# Patient Record
Sex: Male | Born: 1935 | Race: White | Hispanic: No | Smoking: Former smoker
Health system: Southern US, Community
[De-identification: ages and names within clinical notes are randomized; demographics above are authoritative.]

## PROBLEM LIST (undated history)

## (undated) DIAGNOSIS — R296 Repeated falls: Secondary | ICD-10-CM

## (undated) DIAGNOSIS — E785 Hyperlipidemia, unspecified: Secondary | ICD-10-CM

## (undated) DIAGNOSIS — I639 Cerebral infarction, unspecified: Secondary | ICD-10-CM

## (undated) DIAGNOSIS — I10 Essential (primary) hypertension: Secondary | ICD-10-CM

## (undated) DIAGNOSIS — W19XXXA Unspecified fall, initial encounter: Secondary | ICD-10-CM

## (undated) DIAGNOSIS — A419 Sepsis, unspecified organism: Secondary | ICD-10-CM

## (undated) DIAGNOSIS — I480 Paroxysmal atrial fibrillation: Secondary | ICD-10-CM

## (undated) DIAGNOSIS — G20 Parkinson's disease: Secondary | ICD-10-CM

## (undated) DIAGNOSIS — I5189 Other ill-defined heart diseases: Secondary | ICD-10-CM

## (undated) DIAGNOSIS — G20A1 Parkinson's disease without dyskinesia, without mention of fluctuations: Secondary | ICD-10-CM

## (undated) HISTORY — DX: Hyperlipidemia, unspecified: E78.5

## (undated) HISTORY — DX: Other ill-defined heart diseases: I51.89

## (undated) HISTORY — DX: Repeated falls: R29.6

## (undated) HISTORY — PX: X-STOP IMPLANTATION: SHX2677

## (undated) HISTORY — DX: Unspecified fall, initial encounter: W19.XXXA

## (undated) HISTORY — DX: Paroxysmal atrial fibrillation: I48.0

## (undated) HISTORY — PX: CARDIOVASCULAR STRESS TEST: SHX262

---
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
## 2005-03-08 ENCOUNTER — Emergency Department: Payer: Self-pay | Admitting: Emergency Medicine

## 2005-03-18 ENCOUNTER — Emergency Department: Payer: Self-pay | Admitting: Internal Medicine

## 2005-10-19 ENCOUNTER — Ambulatory Visit: Payer: Self-pay | Admitting: Endocrinology

## 2007-01-12 HISTORY — PX: CARPAL TUNNEL RELEASE: SHX101

## 2007-09-26 ENCOUNTER — Ambulatory Visit: Payer: Self-pay | Admitting: Physician Assistant

## 2008-01-16 ENCOUNTER — Ambulatory Visit: Payer: Self-pay | Admitting: Unknown Physician Specialty

## 2008-01-16 ENCOUNTER — Ambulatory Visit: Payer: Self-pay | Admitting: Internal Medicine

## 2008-01-23 ENCOUNTER — Inpatient Hospital Stay: Payer: Self-pay | Admitting: Unknown Physician Specialty

## 2008-01-29 ENCOUNTER — Encounter: Payer: Self-pay | Admitting: Internal Medicine

## 2008-02-12 ENCOUNTER — Encounter: Payer: Self-pay | Admitting: Internal Medicine

## 2008-03-11 ENCOUNTER — Encounter: Payer: Self-pay | Admitting: Internal Medicine

## 2008-04-11 ENCOUNTER — Encounter: Payer: Self-pay | Admitting: Internal Medicine

## 2008-05-11 ENCOUNTER — Encounter: Payer: Self-pay | Admitting: Internal Medicine

## 2008-06-11 ENCOUNTER — Encounter: Payer: Self-pay | Admitting: Internal Medicine

## 2010-06-01 ENCOUNTER — Ambulatory Visit: Payer: Self-pay | Admitting: Emergency Medicine

## 2010-06-01 ENCOUNTER — Ambulatory Visit: Payer: Self-pay | Admitting: Internal Medicine

## 2010-06-02 ENCOUNTER — Ambulatory Visit: Payer: Self-pay | Admitting: Emergency Medicine

## 2010-06-29 ENCOUNTER — Ambulatory Visit: Payer: Self-pay | Admitting: Internal Medicine

## 2010-08-20 ENCOUNTER — Encounter: Payer: Self-pay | Admitting: Internal Medicine

## 2010-09-30 ENCOUNTER — Other Ambulatory Visit: Payer: Self-pay | Admitting: Internal Medicine

## 2010-09-30 MED ORDER — AMLODIPINE BESYLATE 5 MG PO TABS: 5.0000 mg | ORAL_TABLET | Freq: Every day | ORAL | Status: AC

## 2010-11-04 ENCOUNTER — Ambulatory Visit (INDEPENDENT_AMBULATORY_CARE_PROVIDER_SITE_OTHER): Payer: Medicare Other | Admitting: Internal Medicine

## 2010-11-04 ENCOUNTER — Encounter: Payer: Self-pay | Admitting: Internal Medicine

## 2010-11-04 VITALS — BP 156/80 | HR 85 | Temp 98.6°F | Resp 16 | Ht 63.0 in | Wt 156.0 lb

## 2010-11-04 DIAGNOSIS — I10 Essential (primary) hypertension: Secondary | ICD-10-CM | POA: Insufficient documentation

## 2010-11-04 DIAGNOSIS — Z23 Encounter for immunization: Secondary | ICD-10-CM

## 2010-11-04 DIAGNOSIS — L989 Disorder of the skin and subcutaneous tissue, unspecified: Secondary | ICD-10-CM

## 2010-11-04 DIAGNOSIS — J40 Bronchitis, not specified as acute or chronic: Secondary | ICD-10-CM

## 2010-11-04 DIAGNOSIS — E785 Hyperlipidemia, unspecified: Secondary | ICD-10-CM

## 2010-11-04 LAB — COMPREHENSIVE METABOLIC PANEL
ALT: 22 U/L (ref 0–53)
AST: 26 U/L (ref 0–37)
Calcium: 9.3 mg/dL (ref 8.4–10.5)
Chloride: 106 mEq/L (ref 96–112)
Creatinine, Ser: 1 mg/dL (ref 0.4–1.5)
Sodium: 142 mEq/L (ref 135–145)
Total Protein: 7.5 g/dL (ref 6.0–8.3)

## 2010-11-04 LAB — LIPID PANEL
LDL Cholesterol: 105 mg/dL — ABNORMAL HIGH (ref 0–99)
Total CHOL/HDL Ratio: 4
Triglycerides: 138 mg/dL (ref 0.0–149.0)

## 2010-11-04 MED ORDER — AZITHROMYCIN 250 MG PO TABS: ORAL_TABLET | ORAL | Status: DC

## 2010-11-04 NOTE — Patient Instructions (Signed)
Start antibiotics. We will set up lung function testing. Follow up in 1 month.

## 2010-11-04 NOTE — Progress Notes (Signed)
Subjective:    Patient ID: Kevin Bradshaw, male    DOB: 1935/09/28, 75 y.o.   MRN: [id number]  HPI Kevin Bradshaw is a 75 year old male with history of hyperlipidemia and hypertension who presents for followup. His primary concern today is a 2 month history of cough productive of yellow sputum. He denies any nasal congestion, sore throat, myalgia, fever, or chills. He notes that the cough has been fairly consistent over the last 2 months. He denies any known sick contacts. He denies any shortness of breath or chest pain. He denies any palpitations. He denies any lower extremity edema. He is a former smoker and smoked for approximately 40 years. He has never been diagnosed with COPD. He has not taken any medications for his cough.  He also notes a new skin lesion on his left temple. He reports this has been present for several months. It appears to be getting larger and is occasionally itchy. He denies any trauma at this site.   Outpatient Encounter Prescriptions as of 11/04/2010  Medication Sig Dispense Refill  . aspirin 81 MG tablet Take 81 mg by mouth daily.        . Calcium Carbonate (CALCIUM 500 PO) Take 2 tablets by mouth daily.        . cholecalciferol (VITAMIN D) 400 UNITS TABS Take 400 Units by mouth daily.        . Coenzyme Q-10 100 MG capsule Take 100 mg by mouth daily.        . Garlic 2000 MG TBEC Take 1 tablet by mouth daily.        Marland Kitchen lovastatin (MEVACOR) 20 MG tablet Take 20 mg by mouth at bedtime.        . Omega-3 Fatty Acids (FISH OIL) 1000 MG CAPS Take 1 capsule by mouth daily.        Marland Kitchen amLODipine (NORVASC) 5 MG tablet Take 1 tablet (5 mg total) by mouth daily.  30 tablet  6     Review of Systems  Constitutional: Negative for fever, chills, activity change, appetite change, fatigue and unexpected weight change.  HENT: Negative for ear pain, congestion, sneezing, neck pain, postnasal drip and sinus pressure.   Eyes: Negative for visual disturbance.  Respiratory: Positive for  cough. Negative for shortness of breath and wheezing.   Cardiovascular: Negative for chest pain, palpitations and leg swelling.  Gastrointestinal: Negative for abdominal pain and abdominal distention.  Genitourinary: Negative for dysuria, urgency and difficulty urinating.  Musculoskeletal: Negative for arthralgias and gait problem.  Skin: Positive for wound. Negative for color change and rash.  Neurological: Negative for headaches.  Hematological: Negative for adenopathy.  Psychiatric/Behavioral: Negative for sleep disturbance and dysphoric mood. The patient is not nervous/anxious.    BP 156/80  Pulse 85  Temp(Src) 98.6 F (37 C) (Oral)  Resp 16  Ht 5\' 3"  (1.6 m)  Wt 156 lb (70.761 kg)  BMI 27.63 kg/m2  SpO2 97%     Objective:   Physical Exam  Constitutional: He is oriented to person, place, and time. He appears well-developed and well-nourished. No distress.  HENT:  Head: Normocephalic and atraumatic.  Right Ear: External ear normal.  Left Ear: External ear normal.  Nose: Nose normal.  Mouth/Throat: Oropharynx is clear and moist. No oropharyngeal exudate.  Eyes: Conjunctivae and EOM are normal. Pupils are equal, round, and reactive to light. Right eye exhibits no discharge. Left eye exhibits no discharge. No scleral icterus.  Neck: Normal range of motion. Neck supple.  No tracheal deviation present. No thyromegaly present.  Cardiovascular: Normal rate, regular rhythm and normal heart sounds.  Exam reveals no gallop and no friction rub.   No murmur heard. Pulmonary/Chest: Effort normal. No respiratory distress. He has no decreased breath sounds. He has no wheezes. He has rhonchi in the right middle field. He has no rales.   He exhibits no tenderness.  Musculoskeletal: Normal range of motion. He exhibits no edema.  Lymphadenopathy:    He has no cervical adenopathy.  Neurological: He is alert and oriented to person, place, and time. No cranial nerve deficit. Coordination normal.    Skin: Skin is warm and dry. No rash noted. He is not diaphoretic. No erythema. No pallor.     Psychiatric: He has a normal mood and affect. His behavior is normal. Judgment and thought content normal.          Assessment & Plan:  1. Cough - patient with a two-month history of cough productive of yellow sputum. Exam is remarkable for slightly prolonged expiratory phase and a few rhonchi which clear with cough. Will treat as bronchitis with a course of azithromycin. There is no wheezing and I don't think he would benefit from prednisone. I would like to set him up for pulmonary function testing to evaluate for COPD. He had a recent chest x-ray performed [facility] which was normal. He will followup here in one month after pulmonary function testing is complete.  2. Skin Lesion -skin lesion in the left temple is most consistent with actinic keratoses or early squamous cell carcinoma. We'll set him up with dermatology for biopsy and resection.  3. Hyperlipidemia - will check liver function test and lipids with labs today.  4. Hypertension -blood pressure is slightly elevated today. He reports it has been better controlled at home. Will check renal function with labs today. Will repeat blood pressure check in one month.  5. Health Maintenance - patient declines flu shot today. Will give pneumonia shot.

## 2010-11-09 ENCOUNTER — Encounter: Payer: Self-pay | Admitting: Pulmonary Disease

## 2010-11-09 ENCOUNTER — Ambulatory Visit (INDEPENDENT_AMBULATORY_CARE_PROVIDER_SITE_OTHER)
Admission: RE | Admit: 2010-11-09 | Discharge: 2010-11-09 | Disposition: A | Discharge status: Home / Self Care | Payer: Medicare Other | Source: Ambulatory Visit | Attending: Pulmonary Disease | Admitting: Pulmonary Disease

## 2010-11-09 ENCOUNTER — Institutional Professional Consult (permissible substitution): Payer: Medicare Other | Admitting: Pulmonary Disease

## 2010-11-09 ENCOUNTER — Ambulatory Visit (INDEPENDENT_AMBULATORY_CARE_PROVIDER_SITE_OTHER): Payer: Medicare Other | Admitting: Pulmonary Disease

## 2010-11-09 VITALS — BP 138/70 | HR 77 | Temp 97.7°F | Ht 63.0 in | Wt 158.8 lb

## 2010-11-09 DIAGNOSIS — R05 Cough: Secondary | ICD-10-CM

## 2010-11-09 MED ORDER — GUAIFENESIN-DM 100-10 MG/5ML PO SYRP: 5.0000 mL | ORAL_SOLUTION | Freq: Three times a day (TID) | ORAL | Status: AC | PRN

## 2010-11-09 MED ORDER — CHLORPHENIRAMINE MALEATE 4 MG PO TABS: 4.0000 mg | ORAL_TABLET | Freq: Four times a day (QID) | ORAL | Status: DC | PRN

## 2010-11-09 NOTE — Patient Instructions (Signed)
Chronic cough, or cough lasting longer than 8 weeks is due to any of the following conditions in nearly 95% of cases: upper airway cough syndrome, GERD, asthma, chronic bronchitis, bronchiectasis, and non asthmatic eosinophilic bronchitis.  Smoking and ACE-Inhibitors are other common causes to consider.    Upper Airway Cough Syndrome, or UACS, is caused by either rhinitis (allergic, drug induced, vasomotor rhinitis, environmental irritant), or sinusitis (inflammatory, bacterial, or other infectious).  Effective forms of therapies include nasal steroids, antihistamines, decongestants, ipratropium nasal spray, and saline rinses.  Often the diagnosis is confirmed by response to treatment, and failure to respond to therapy necessitates further diagnostic work up such as CT imaging of the sinuses or 24 hour esophageal impedence studies.    Mr. Krysiak, for you we recommend: -Robitussin DM 2 tablespoons every 8 hours as needed for cough -Chlortrimeton every 4-6 hours as needed for cough -Chest X-ray today -Follow up with [place] in one month, call sooner if you are worse

## 2010-11-09 NOTE — Progress Notes (Signed)
  Subjective:    Patient ID: Kevin Bradshaw, male    DOB: 1935/03/01, 75 y.o.   MRN: [id number]  HPI75 y/o ex-smoker presented initially to [facility] with two months of cough productive of yellow sputum worse later in the day.  No associated shortness of breath, fevers, chills, wheezing.  Was prescribed azithromycin 10/24 and said that this helped the cough.   He states that he never had breathing trouble or any significant health problems as a child.  He has only been hospitalized for back surgery several years ago.  He smoked 1.5-2 packs per day up until 2005, and thinks that he smoked for 30-40 years.  He says that he can walk up to a mile on level ground at a steady pace without walking and never has to stop working around the house because of shortness of breath.  He denies chest pain, swelling in legs, sinus congestion, sour taste or acid in mouth, heartburn.  His weight has been stable.    Review of Systems  Constitutional: Negative for fever, chills, activity change, appetite change and unexpected weight change.  HENT: Positive for congestion. Negative for sore throat, rhinorrhea, sneezing, trouble swallowing, dental problem, voice change and postnasal drip.   Eyes: Negative for visual disturbance.  Respiratory: Positive for cough. Negative for choking and shortness of breath.   Cardiovascular: Negative for chest pain and leg swelling.  Gastrointestinal: Negative for nausea, vomiting and abdominal pain.  Genitourinary: Negative for difficulty urinating.  Musculoskeletal: Negative for arthralgias.  Skin: Negative for rash.  Psychiatric/Behavioral: Negative for behavioral problems and confusion.       Objective:   Physical Exam Gen: well appearing, no acute distress HEENT: NCAT, PERRL, EOMi, OP clear, neck supple without masses PULM: CTA B, slight barrel chest CV: RRR, no mgr, no JVD AB: BS+, soft, nontender, no hsm Ext: warm, no edema, no clubbing, no cyanosis Derm:  no rash or skin breakdown Neuro: A&Ox4, CN II-XII intact, strength 5/5 in all 4 extremeties      Assessment & Plan:  75 y/o male with a significant smoking history presents for evaluation of two months of cough productive of yellow sputum.  He denies shortness of breath or wheezing, however his syndrome seems consistent with chronic bronchitis related to his prior tobacco use.  Also in the differential would include occult aspiration, sinus disease (he denies), reflux, or much less likely pertussis, eosinophilic bronchitis or atypical mycobacteria.  He improved on azithromycin which suggests an infectious etiology for his persistent bronchitis.  There is little role for sputum culture today as he finished his course of antibiotics this morning.  If he contin  Today in clinic he had simple spirometry performed which showed an essentially normal flow volume loop with occasional cough, but no clear evidence of obstruction.  This makes COPD much less likely.  If he continues to do well after the course of antibiotics and with symptomatic therapy (mucolytics and anti-histamines that I am adding today) then we will assume that this was a prolonged course of acute bronchitis.  However if he still has symptoms in a month then we will need to perform a sputum culture and cytology to look for MAC and eosinophils respectively.  Plan: -OTC meds: Robitussin DM and Chlorpheniramine -RTC in 4 weeks

## 2010-12-07 ENCOUNTER — Ambulatory Visit: Payer: Medicare Other | Admitting: Internal Medicine

## 2011-05-19 ENCOUNTER — Encounter: Payer: Medicare Other | Admitting: Internal Medicine

## 2011-06-14 IMAGING — US US CAROTID DUPLEX BILAT
1 series · 17 of 24 positions shown · non-contrast
Comparison: none

REASON FOR EXAM: Rt Neck Pain
COMMENTS:

[Series 1: us carotid duplex bilat · 17 of 69 slices shown]
[im 1/69]
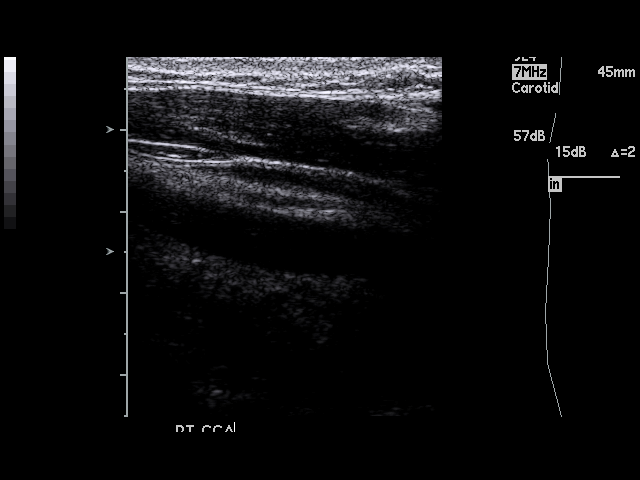
[im 6/69]
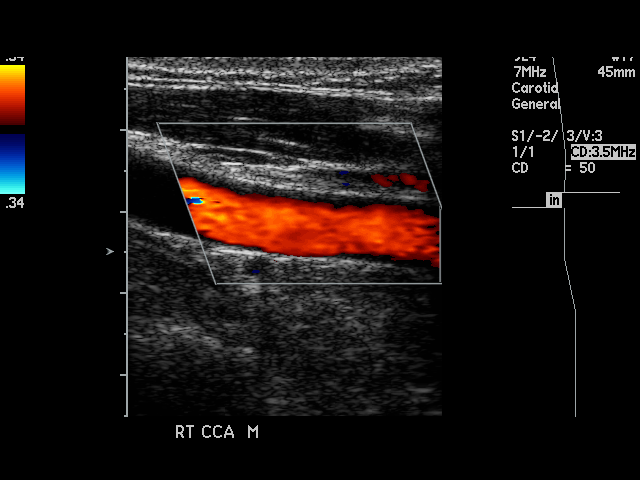
[im 9/69]
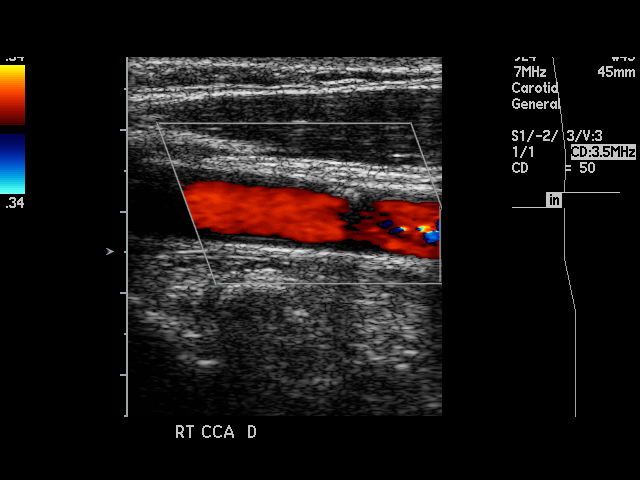
[im 12/69]
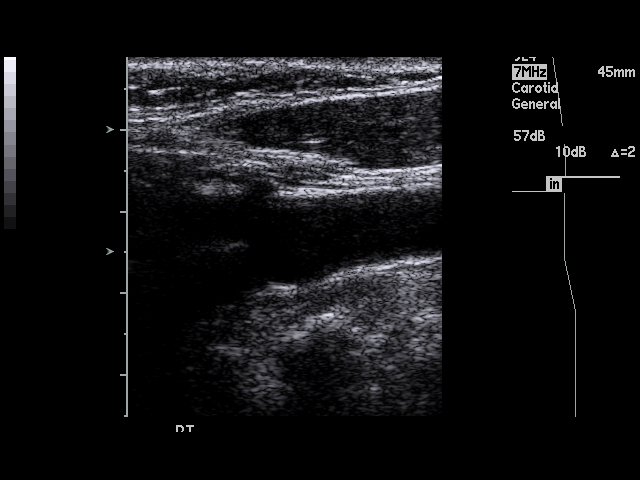
[im 18/69]
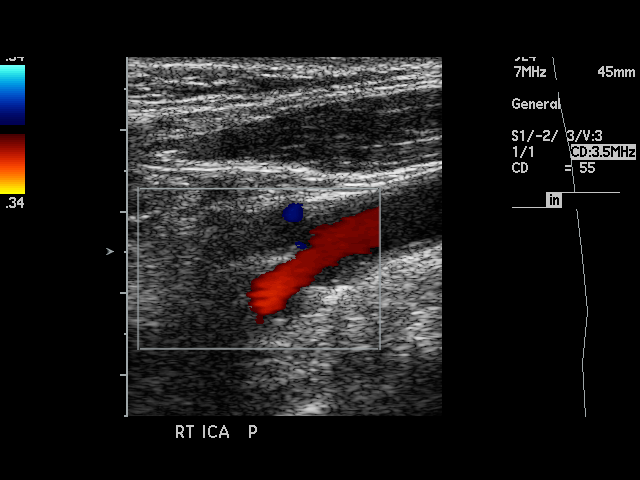
[im 21/69]
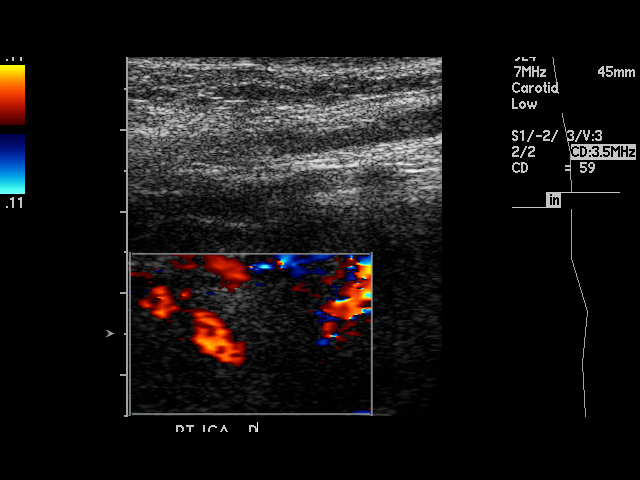
[im 27/69]
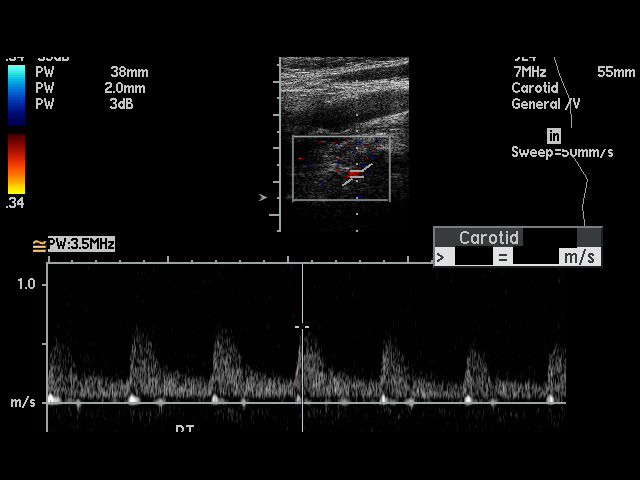
[im 30/69]
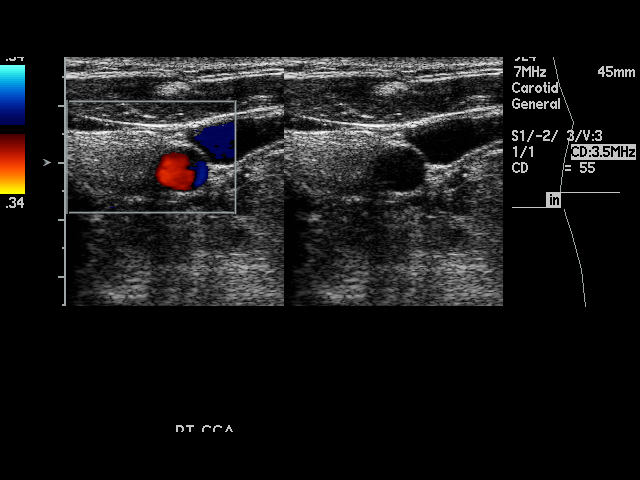
[im 36/69]
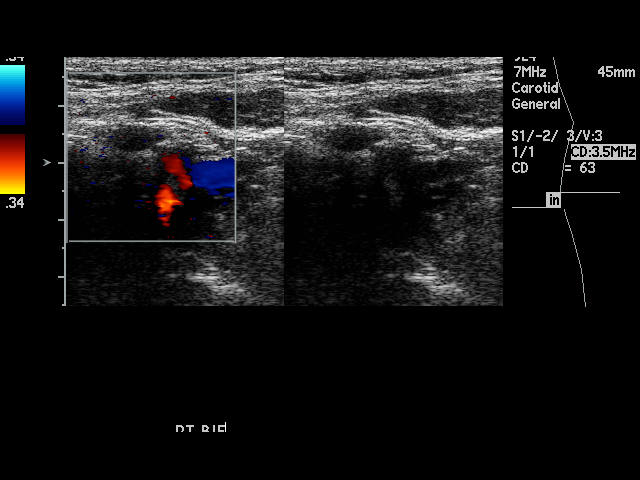
[im 39/69]
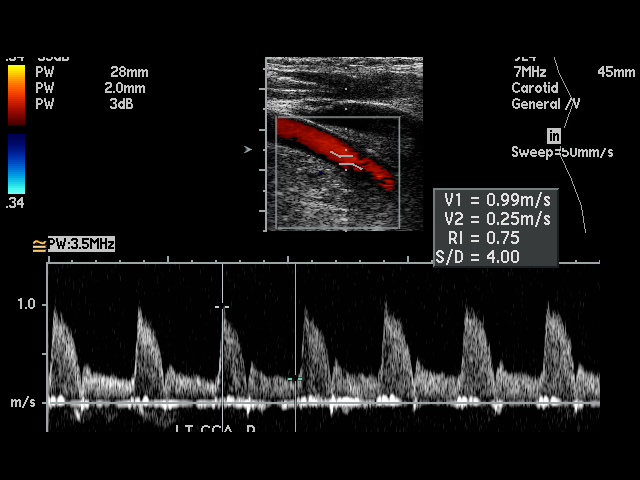
[im 42/69]
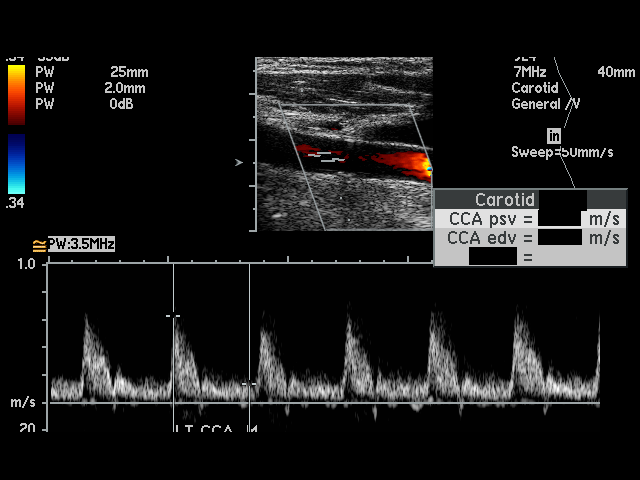
[im 48/69]
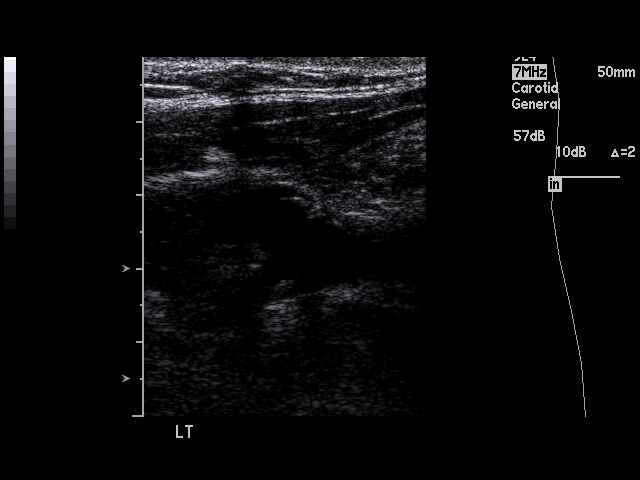
[im 51/69]
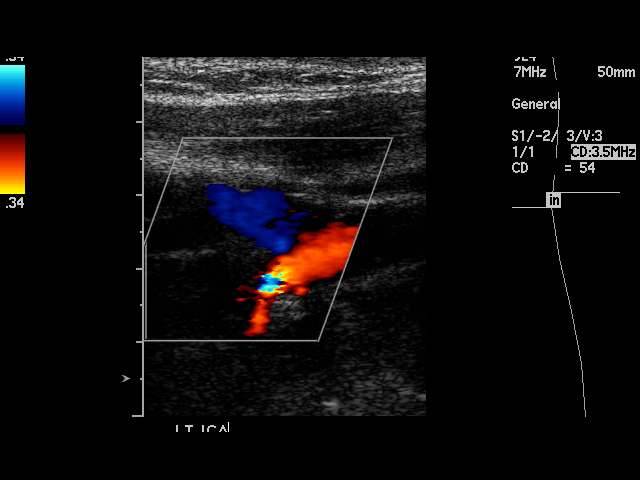
[im 57/69]
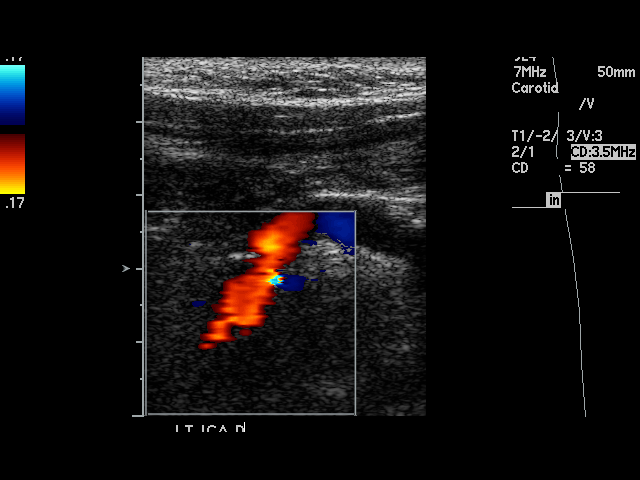
[im 60/69]
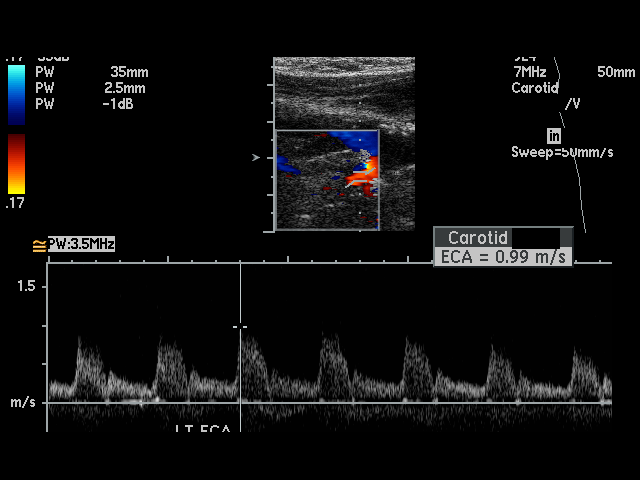
[im 63/69]
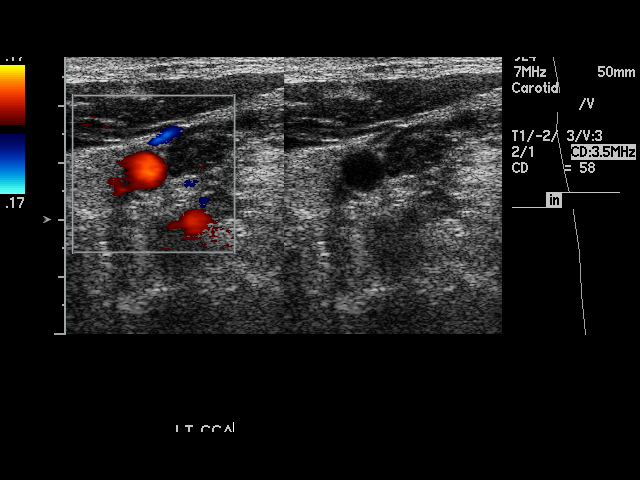
[im 69/69]
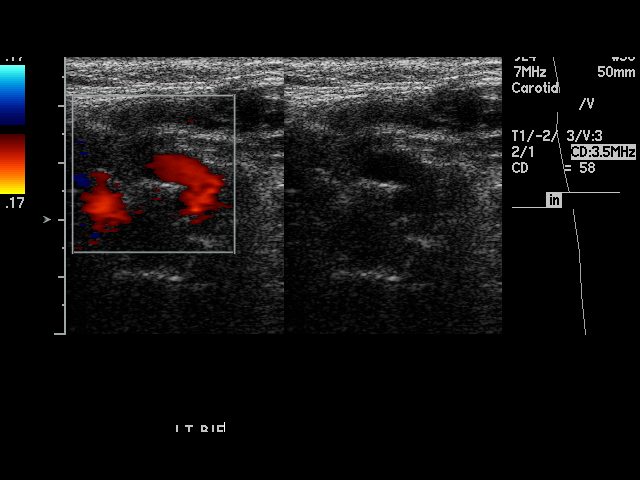

[17 of 24 positions shown; findings below may reference images not displayed]

PROCEDURE:     ONY - ONY CAROTID DOPPLER BILATERAL  - June 01, 2010  [DATE]

RESULT:     There is slight calcific plaque formation at the carotid
bifurcation on the left. No plaque formation is seen on the right.

On the right, the peak right common carotid artery flow velocity measures
0.86 m/sec and the peak right internal carotid artery flow velocity measures
0.865 m/sec. The ICA/CCA ratio is 1.006.

On the left, the peak left common carotid artery flow velocity measures
0.941 m/sec and the peak left internal carotid artery flow velocity measures
0.594 m/sec. The ICA/CCA ratio is 0.941.

These values bilaterally are in the normal range and are consistent with the
absence of hemodynamically significant stenosis.

There is observed antegrade flow in both vertebrals.
IMPRESSION: 1.  No hemodynamically significant stenosis is seen on either side.
2.  There is antegrade flow in both vertebrals.

## 2015-12-08 ENCOUNTER — Other Ambulatory Visit: Payer: Self-pay | Admitting: Neurology

## 2015-12-08 DIAGNOSIS — R262 Difficulty in walking, not elsewhere classified: Secondary | ICD-10-CM

## 2015-12-08 DIAGNOSIS — M79604 Pain in right leg: Secondary | ICD-10-CM

## 2015-12-08 DIAGNOSIS — M79605 Pain in left leg: Principal | ICD-10-CM

## 2016-01-06 ENCOUNTER — Emergency Department: Payer: Medicare Other

## 2016-01-06 ENCOUNTER — Observation Stay
Admission: EM | Admit: 2016-01-06 | Discharge: 2016-01-08 | Disposition: A | Discharge status: Home / Self Care | Payer: Medicare Other | Attending: Internal Medicine | Admitting: Internal Medicine

## 2016-01-06 ENCOUNTER — Encounter: Payer: Self-pay | Admitting: *Deleted

## 2016-01-06 DIAGNOSIS — Z79899 Other long term (current) drug therapy: Secondary | ICD-10-CM | POA: Insufficient documentation

## 2016-01-06 DIAGNOSIS — R05 Cough: Secondary | ICD-10-CM | POA: Diagnosis present

## 2016-01-06 DIAGNOSIS — E876 Hypokalemia: Secondary | ICD-10-CM | POA: Insufficient documentation

## 2016-01-06 DIAGNOSIS — J189 Pneumonia, unspecified organism: Secondary | ICD-10-CM | POA: Diagnosis not present

## 2016-01-06 DIAGNOSIS — J181 Lobar pneumonia, unspecified organism: Secondary | ICD-10-CM

## 2016-01-06 DIAGNOSIS — E785 Hyperlipidemia, unspecified: Secondary | ICD-10-CM | POA: Diagnosis not present

## 2016-01-06 DIAGNOSIS — Z87891 Personal history of nicotine dependence: Secondary | ICD-10-CM | POA: Diagnosis not present

## 2016-01-06 DIAGNOSIS — M7989 Other specified soft tissue disorders: Secondary | ICD-10-CM | POA: Insufficient documentation

## 2016-01-06 DIAGNOSIS — G20 Parkinson's disease: Secondary | ICD-10-CM | POA: Diagnosis not present

## 2016-01-06 DIAGNOSIS — Z7982 Long term (current) use of aspirin: Secondary | ICD-10-CM | POA: Diagnosis not present

## 2016-01-06 DIAGNOSIS — I10 Essential (primary) hypertension: Secondary | ICD-10-CM | POA: Diagnosis not present

## 2016-01-06 DIAGNOSIS — M6281 Muscle weakness (generalized): Secondary | ICD-10-CM

## 2016-01-06 DIAGNOSIS — R262 Difficulty in walking, not elsewhere classified: Secondary | ICD-10-CM

## 2016-01-06 HISTORY — DX: Parkinson's disease without dyskinesia, without mention of fluctuations: G20.A1

## 2016-01-06 HISTORY — DX: Parkinson's disease: G20

## 2016-01-06 HISTORY — DX: Essential (primary) hypertension: I10

## 2016-01-06 LAB — BASIC METABOLIC PANEL
Anion gap: 9 (ref 5–15)
BUN: 17 mg/dL (ref 6–20)
CALCIUM: 9.2 mg/dL (ref 8.9–10.3)
CO2: 33 mmol/L — ABNORMAL HIGH (ref 22–32)
CREATININE: 1.18 mg/dL (ref 0.61–1.24)
Chloride: 94 mmol/L — ABNORMAL LOW (ref 101–111)
GFR, EST NON AFRICAN AMERICAN: 57 mL/min — AB (ref >60)
Glucose, Bld: 125 mg/dL — ABNORMAL HIGH (ref 65–99)
Potassium: 2.6 mmol/L — CL (ref 3.5–5.1)
SODIUM: 136 mmol/L (ref 135–145)

## 2016-01-06 LAB — URINALYSIS, COMPLETE (UACMP) WITH MICROSCOPIC
BILIRUBIN URINE: NEGATIVE
Bacteria, UA: NONE SEEN
GLUCOSE, UA: NEGATIVE mg/dL
Hgb urine dipstick: NEGATIVE
KETONES UR: NEGATIVE mg/dL
LEUKOCYTES UA: NEGATIVE
Nitrite: NEGATIVE
PH: 7 (ref 5.0–8.0)
Protein, ur: 30 mg/dL — AB
RBC / HPF: NONE SEEN RBC/hpf (ref 0–5)
SPECIFIC GRAVITY, URINE: 1.017 (ref 1.005–1.030)

## 2016-01-06 LAB — CBC
HCT: 41.1 % (ref 40.0–52.0)
Hemoglobin: 14 g/dL (ref 13.0–18.0)
MCH: 30.7 pg (ref 26.0–34.0)
MCHC: 34.1 g/dL (ref 32.0–36.0)
MCV: 90 fL (ref 80.0–100.0)
PLATELETS: 172 10*3/uL (ref 150–440)
RBC: 4.57 MIL/uL (ref 4.40–5.90)
RDW: 13.8 % (ref 11.5–14.5)
WBC: 5 10*3/uL (ref 3.8–10.6)

## 2016-01-06 LAB — MAGNESIUM: MAGNESIUM: 1.8 mg/dL (ref 1.7–2.4)

## 2016-01-06 LAB — INFLUENZA PANEL BY PCR (TYPE A & B)
INFLBPCR: NEGATIVE
Influenza A By PCR: NEGATIVE

## 2016-01-06 MED ORDER — GABAPENTIN 100 MG PO CAPS
200.0000 mg | ORAL_CAPSULE | Freq: Three times a day (TID) | ORAL | Status: DC
  Administered 2016-01-06 – 2016-01-08 (×5): 200 mg via ORAL
  Filled 2016-01-06 (×5): qty 2

## 2016-01-06 MED ORDER — PIPERACILLIN-TAZOBACTAM 3.375 G IVPB 30 MIN
3.3750 g | Freq: Once | INTRAVENOUS | Status: AC
  Administered 2016-01-06: 3.375 g via INTRAVENOUS
  Filled 2016-01-06: qty 50

## 2016-01-06 MED ORDER — IPRATROPIUM-ALBUTEROL 0.5-2.5 (3) MG/3ML IN SOLN
3.0000 mL | RESPIRATORY_TRACT | Status: DC
  Administered 2016-01-07 – 2016-01-08 (×6): 3 mL via RESPIRATORY_TRACT
  Filled 2016-01-06 (×8): qty 3

## 2016-01-06 MED ORDER — OXYCODONE HCL 5 MG PO TABS
5.0000 mg | ORAL_TABLET | ORAL | Status: DC | PRN
  Administered 2016-01-06 – 2016-01-08 (×4): 5 mg via ORAL
  Filled 2016-01-06 (×4): qty 1

## 2016-01-06 MED ORDER — SODIUM CHLORIDE 0.9 % IV SOLN
30.0000 meq | Freq: Once | INTRAVENOUS | Status: AC
  Administered 2016-01-06: 30 meq via INTRAVENOUS
  Filled 2016-01-06: qty 15

## 2016-01-06 MED ORDER — HYDRALAZINE HCL 20 MG/ML IJ SOLN
10.0000 mg | INTRAMUSCULAR | Status: DC | PRN
  Administered 2016-01-07: 21:00:00 10 mg via INTRAVENOUS
  Filled 2016-01-06: qty 1

## 2016-01-06 MED ORDER — ACETAMINOPHEN 325 MG PO TABS: 650.0000 mg | ORAL_TABLET | Freq: Four times a day (QID) | ORAL | Status: DC | PRN

## 2016-01-06 MED ORDER — LEVOFLOXACIN IN D5W 250 MG/50ML IV SOLN
250.0000 mg | Freq: Every day | INTRAVENOUS | Status: DC
  Administered 2016-01-07: 250 mg via INTRAVENOUS
  Filled 2016-01-06: qty 50

## 2016-01-06 MED ORDER — CARBIDOPA-LEVODOPA 25-100 MG PO TABS
1.0000 | ORAL_TABLET | Freq: Three times a day (TID) | ORAL | Status: DC
  Administered 2016-01-06 – 2016-01-08 (×5): 1 via ORAL
  Filled 2016-01-06 (×5): qty 1

## 2016-01-06 MED ORDER — GUAIFENESIN-CODEINE 100-10 MG/5ML PO SOLN
10.0000 mL | ORAL | Status: DC | PRN
  Administered 2016-01-06 – 2016-01-07 (×2): 10 mL via ORAL
  Filled 2016-01-06 (×2): qty 10

## 2016-01-06 MED ORDER — GUAIFENESIN ER 600 MG PO TB12
600.0000 mg | ORAL_TABLET | Freq: Two times a day (BID) | ORAL | Status: DC
  Administered 2016-01-07 – 2016-01-08 (×3): 600 mg via ORAL
  Filled 2016-01-06 (×3): qty 1

## 2016-01-06 MED ORDER — IPRATROPIUM-ALBUTEROL 0.5-2.5 (3) MG/3ML IN SOLN
3.0000 mL | Freq: Once | RESPIRATORY_TRACT | Status: AC
  Administered 2016-01-06: 3 mL via RESPIRATORY_TRACT
  Filled 2016-01-06: qty 3

## 2016-01-06 MED ORDER — METHYLPREDNISOLONE SODIUM SUCC 125 MG IJ SOLR
60.0000 mg | INTRAMUSCULAR | Status: DC
  Administered 2016-01-06 – 2016-01-07 (×2): 60 mg via INTRAVENOUS
  Filled 2016-01-06 (×3): qty 2

## 2016-01-06 MED ORDER — ASPIRIN EC 81 MG PO TBEC
81.0000 mg | DELAYED_RELEASE_TABLET | Freq: Every day | ORAL | Status: DC
  Administered 2016-01-07 – 2016-01-08 (×2): 81 mg via ORAL
  Filled 2016-01-06 (×2): qty 1

## 2016-01-06 MED ORDER — LEVOFLOXACIN IN D5W 500 MG/100ML IV SOLN
500.0000 mg | Freq: Once | INTRAVENOUS | Status: AC
  Administered 2016-01-06: 22:00:00 500 mg via INTRAVENOUS
  Filled 2016-01-06: qty 100

## 2016-01-06 MED ORDER — PRAVASTATIN SODIUM 40 MG PO TABS
40.0000 mg | ORAL_TABLET | Freq: Every day | ORAL | Status: DC
Start: 2016-01-07 — End: 2016-01-08
  Administered 2016-01-07: 40 mg via ORAL
  Filled 2016-01-06: qty 1

## 2016-01-06 MED ORDER — ONDANSETRON HCL 4 MG/2ML IJ SOLN: 4.0000 mg | Freq: Four times a day (QID) | INTRAMUSCULAR | Status: DC | PRN

## 2016-01-06 MED ORDER — SODIUM CHLORIDE 0.9% FLUSH
3.0000 mL | Freq: Two times a day (BID) | INTRAVENOUS | Status: DC
  Administered 2016-01-06 – 2016-01-08 (×4): 3 mL via INTRAVENOUS

## 2016-01-06 MED ORDER — HYDRALAZINE HCL 20 MG/ML IJ SOLN: 10.0000 mg | Freq: Once | INTRAMUSCULAR | Status: DC

## 2016-01-06 MED ORDER — ONDANSETRON HCL 4 MG PO TABS: 4.0000 mg | ORAL_TABLET | Freq: Four times a day (QID) | ORAL | Status: DC | PRN

## 2016-01-06 MED ORDER — ENOXAPARIN SODIUM 40 MG/0.4ML ~~LOC~~ SOLN
40.0000 mg | SUBCUTANEOUS | Status: DC
  Administered 2016-01-06 – 2016-01-07 (×2): 40 mg via SUBCUTANEOUS
  Filled 2016-01-06 (×2): qty 0.4

## 2016-01-06 MED ORDER — ACETAMINOPHEN 650 MG RE SUPP: 650.0000 mg | Freq: Four times a day (QID) | RECTAL | Status: DC | PRN

## 2016-01-06 MED ORDER — POTASSIUM CHLORIDE CRYS ER 20 MEQ PO TBCR
40.0000 meq | EXTENDED_RELEASE_TABLET | Freq: Once | ORAL | Status: AC
  Administered 2016-01-06: 40 meq via ORAL
  Filled 2016-01-06: qty 2

## 2016-01-06 MED ORDER — AMLODIPINE BESYLATE 5 MG PO TABS
5.0000 mg | ORAL_TABLET | Freq: Every day | ORAL | Status: DC
  Administered 2016-01-07 – 2016-01-08 (×2): 5 mg via ORAL
  Filled 2016-01-06 (×2): qty 1

## 2016-01-06 NOTE — ED Notes (Signed)
Patient placed on 2L [place] due to SpO2 90%. Patient tolerating well, up to 94%.

## 2016-01-06 NOTE — ED Provider Notes (Signed)
Time Seen: Approximately 1423  I have reviewed the triage notes  Chief Complaint: Cough; Nasal Congestion; Urinary Incontinence; and Leg Swelling   History of Present Illness: Kevin Bradshaw is a 80 y.o. male *who presents with a persistent cough and some generalized weakness. Patient has a history of Parkinson's disease and was recently started on Sinemet which the daughter felt may be contributing towards his weakness. Patient had a non-syncopal fall with some transient altered mental status with no obvious injury approximately 2 days ago. Patient continues to be on diuretic therapy. The family is the primary historians this time the patient himself states shortness of breath without any chest pain. There's been no focal weakness more of a generalized weakness and difficulty with ambulation.  Past Medical History:  Diagnosis Date  . Hyperlipidemia     Patient Active Problem List   Diagnosis Date Noted  . Productive cough 11/09/2010  . Hypertension 11/04/2010  . Hyperlipidemia 11/04/2010    Past Surgical History:  Procedure Laterality Date  . CARDIOVASCULAR STRESS TEST     normal  . CARPAL TUNNEL RELEASE  2009   right hand  . X-STOP IMPLANTATION    . X-STOP IMPLANTATION     Dr. Gerrit Heck    Past Surgical History:  Procedure Laterality Date  . CARDIOVASCULAR STRESS TEST     normal  . CARPAL TUNNEL RELEASE  2009   right hand  . X-STOP IMPLANTATION    . X-STOP IMPLANTATION     Dr. Gerrit Heck    Current Outpatient Rx  . Order #: [id number] Class: Historical Med  . Order #: [id number] Class: Historical Med  . Order #: [id number] Class: Historical Med  . Order #: [id number] Class: Historical Med  . Order #: [id number] Class: Historical Med  . Order #: [id number] Class: Historical Med  . Order #: [id number] Class: Historical Med  . Order #: [id number] Class: OTC    Allergies:  Patient has no known allergies.  Family History: Family History  Problem Relation Age of Onset  . Heart disease  Father 86  . Cancer Sister     ? type  . Heart disease Brother   . Cancer Brother     ? type    Social History: Social History  Substance Use Topics  . Smoking status: Former Smoker    Packs/day: 1.50    Years: 50.00    Types: Cigarettes    Quit date: 11/04/2002  . Smokeless tobacco: Never Used  . Alcohol use Yes     Comment: socially     Review of Systems:   10 point review of systems was performed and was otherwise negative:  Constitutional: No fever Eyes: No visual disturbances ENT: No sore throat, ear pain Cardiac: No chest pain Respiratory: Increasing shortness of breath with a dry nonproductive cough. Abdomen: No abdominal pain, no vomiting, No diarrhea Endocrine: No weight loss, No night sweats Extremities: Increasing bilateral peripheral edema without cyanosis Skin: No rashes, easy bruising Neurologic: No focal weakness, trouble with speech or swollowing Urologic: Urinary incontinence which is somewhat unusual for the patient. No back pain or thoracic or neck discomfort.   Physical Exam:  ED Triage Vitals [01/06/16 1330]  Enc Vitals Group     BP (!) 122/99     Pulse Rate 95     Resp 18     Temp 98.3 F (36.8 C)     Temp Source Oral     SpO2 97 %     Weight 160 lb (72.6 kg)  Height 5\' 3"  (1.6 m)     Head Circumference      Peak Flow      Pain Score      Pain Loc      Pain Edu?      Excl. in GC?     General: Awake , Alert , and Oriented times 2, Glasgow Coma Scale 15 Head: Normal cephalic , atraumatic Eyes: Pupils equal , round, reactive to light Nose/Throat: No nasal drainage, patent upper airway without erythema or exudate.  Neck: Supple, Full range of motion, No anterior adenopathy or palpable thyroid masses Lungs: Coarse breath sounds auscultated bilaterally at the bases with some end expiratory wheezing heard symmetrically at the apices  Heart: Regular rate, regular rhythm without murmurs , gallops , or rubs Abdomen: Soft, non tender  without rebound, guarding , or rigidity; bowel sounds positive and symmetric in all 4 quadrants. No organomegaly .        Extremities: Bilateral pitting peripheral edema. Symmetric with circumferential edema Neurologic: Motor is 4 out of 5 and seemingly symmetric with good plantar flexion extension in bilateral upper extremity grasp which seems to be symmetric Skin: warm, dry, no rashes   Labs:   All laboratory work was reviewed including any pertinent negatives or positives listed below:  Labs Reviewed  BASIC METABOLIC PANEL - Abnormal; Notable for the following:       Result Value   Potassium 2.6 (*)    Chloride 94 (*)    CO2 33 (*)    Glucose, Bld 125 (*)    GFR calc non Af Amer 57 (*)    All other components within normal limits  CULTURE, BLOOD (ROUTINE X 2)  CULTURE, BLOOD (ROUTINE X 2)  CBC  INFLUENZA PANEL BY PCR (TYPE A & B, H1N1)  URINALYSIS, COMPLETE (UACMP) WITH MICROSCOPIC  CBG MONITORING, ED  Patient's potassium is significantly low  EKG:  ED ECG REPORT I, Jennye MoccasinBrian S Quigley, the attending physician, personally viewed and interpreted this ECG.  Date: 01/06/2016 EKG Time: *1343 Rate: *97 Rhythm: normal sinus rhythm with occasional PACs QRS Axis: normal Intervals: normal ST/T Wave abnormalities: normal Conduction Disturbances: none Narrative Interpretation: unremarkable No acute ischemic changes noted   Radiology:  "Dg Chest 2 View  Result Date: 01/06/2016 CLINICAL DATA:  Cough and congestion EXAM: CHEST  2 VIEW COMPARISON:  11/09/2010 FINDINGS: Cardiac shadow is within normal limits. The lungs are well aerated bilaterally. Focal right basilar infiltrate is noted projecting in the right middle lobe on lateral projection. Bony structures are within normal limits. IMPRESSION: Right middle lobe infiltrate. Electronically Signed   By: Alcide CleverMark  Lukens M.D.   On: 01/06/2016 14:05  "  I personally reviewed the radiologic studies    ED Course: * Patient's stay here  was uneventful and he was started on supplemental potassium therapy. Patient was given oral and IV doses of potassium. The hypokalemia may be the cause for his generalized weakness at this point. Patient also was given a breathing treatment which I thought may lower his potassium further and that was important because of his shortness of breath and wheezing with what appears to be acute bronchitis with bronchospasm. There is no clinical evidence of community-acquired pneumonia, however the radiologist is calling a right middle lobe infiltrate. Patient was started on blood cultures 2 and IV antibiotics Clinical Course      Final Clinical Impression: *  Final diagnoses:  Community acquired pneumonia of right middle lobe of lung (HCC)  Acute  hypokalemia     Plan:  Inpatient management            Jennye MoccasinBrian S Quigley, MD 01/06/16 [phone]

## 2016-01-06 NOTE — Progress Notes (Signed)
Order for levofloxacin 500 mg IV daily changed to levofloxacin 500 mg x 1 followed by 250 mg IV daily per renal function.  Cindi CarbonMary M Camren Lipsett, PharmD 01/06/16 8:09 PM

## 2016-01-06 NOTE — H&P (Addendum)
[facility] [place] [facility]   PATIENT NAME: Kevin NordmannHenry Bradshaw    MR#:  [id number]  DATE OF BIRTH:  07/28/1935   DATE OF ADMISSION:  01/06/2016  PRIMARY CARE PHYSICIAN: Kevin DoveWALKER,Kevin Bradshaw, Kevin Bradshaw   REQUESTING/REFERRING PHYSICIAN: Huel Bradshaw  CHIEF COMPLAINT:   Chief Complaint  Patient presents with  . Cough  . Nasal Congestion  . Urinary Incontinence  . Leg Swelling    HISTORY OF PRESENT ILLNESS:  Kevin Bradshaw  is a 80 y.o. male with a known history of Essential hypertension, Parkinson's disease who is presenting with cough and weakness. Describes 3-4 day duration of cough nonproductive denies fevers chills associated shortness of breath. Also describes having generalized weakness and by cramping which is been progressively worsening dates that he does have physical therapy following him at the house however he has been falling more lately and unable to get off of the floor.  Emergency department course: Pneumonia noted on x-ray, potassium replacement  PAST MEDICAL HISTORY:   Past Medical History:  Diagnosis Date  . Hyperlipidemia   . Hyperlipidemia   . Hypertension   . Parkinson disease (HCC)     PAST SURGICAL HISTORY:   Past Surgical History:  Procedure Laterality Date  . CARDIOVASCULAR STRESS TEST     normal  . CARPAL TUNNEL RELEASE  2009   right hand  . X-STOP IMPLANTATION    . X-STOP IMPLANTATION     Dr. Gerrit Heckaliff    SOCIAL HISTORY:   Social History  Substance Use Topics  . Smoking status: Former Smoker    Packs/day: 1.50    Years: 50.00    Types: Cigarettes    Quit date: 11/04/2002  . Smokeless tobacco: Never Used  . Alcohol use Yes     Comment: socially    FAMILY HISTORY:   Family History  Problem Relation Age of Onset  . Heart disease Father 2665  . Cancer Sister     ? type  . Heart disease Brother   . Cancer Brother     ? type    DRUG ALLERGIES:  No Known Allergies  REVIEW OF SYSTEMS:  REVIEW OF SYSTEMS:    CONSTITUTIONAL: Denies fevers, chills, Positive fatigue, weakness.  EYES: Denies blurred vision, double vision, or eye pain.  EARS, NOSE, THROAT: Denies tinnitus, ear pain, hearing loss.  RESPIRATORY: Positive cough, shortness of breath, denies wheezing  CARDIOVASCULAR: Denies chest pain, palpitations, edema.  GASTROINTESTINAL: Denies nausea, vomiting, diarrhea, abdominal pain.  GENITOURINARY: Denies dysuria, hematuria.  ENDOCRINE: Denies nocturia or thyroid problems. HEMATOLOGIC AND LYMPHATIC: Denies easy bruising or bleeding.  SKIN: Denies rash or lesions.  MUSCULOSKELETAL: Denies pain in neck, back, shoulder, knees, hips, or further arthritic symptoms. Positive cramping in the thighs NEUROLOGIC: Denies paralysis, paresthesias.  PSYCHIATRIC: Denies anxiety or depressive symptoms. Otherwise full review of systems performed by me is negative.   MEDICATIONS AT HOME:   Prior to Admission medications   Medication Sig Start Date End Date Taking? Authorizing Provider  amLODipine (NORVASC) 5 MG tablet Take 5 mg by mouth daily.   Yes Historical Provider, Kevin Bradshaw  aspirin 81 MG tablet Take 81 mg by mouth daily.     Yes Historical Provider, Kevin Bradshaw  carbidopa-levodopa (SINEMET IR) 25-100 MG tablet Take 1 tablet by mouth 3 (three) times daily.   Yes Historical Provider, Kevin Bradshaw  gabapentin (NEURONTIN) 100 MG capsule Take 200 mg by mouth 3 (three) times daily.   Yes Historical Provider, Kevin Bradshaw  hydrochlorothiazide (HYDRODIURIL) 25 MG tablet Take  25 mg by mouth daily.   Yes Historical Provider, Kevin Bradshaw  ibuprofen (ADVIL,MOTRIN) 200 MG tablet Take 200 mg by mouth every 6 (six) hours as needed.   Yes Historical Provider, Kevin Bradshaw  lovastatin (MEVACOR) 40 MG tablet Take 40 mg by mouth at bedtime.    Yes Historical Provider, Kevin Bradshaw  chlorpheniramine (CHLOR-TRIMETON) 4 MG tablet Take 1 tablet (4 mg total) by mouth every 6 (six) hours as needed. Patient not taking: Reported on 01/06/2016 11/09/10   Lupita Leashouglas B McQuaid, Kevin Bradshaw      VITAL  SIGNS:  Blood pressure 126/90, pulse 92, temperature 98.3 F (36.8 C), temperature source Oral, resp. rate 20, height 5\' 3"  (1.6 m), weight 72.6 kg (160 lb), SpO2 90 %.  PHYSICAL EXAMINATION:  VITAL SIGNS: Vitals:   01/06/16 1630 01/06/16 1700  BP: (!) 143/73 126/90  Pulse: 98 92  Resp: 17 20  Temp:     GENERAL:80 y.o.male currently in Minimal acute distress.  HEAD: Normocephalic, atraumatic.  EYES: Pupils equal, round, reactive to light. Extraocular muscles intact. No scleral icterus.  MOUTH: Moist mucosal membrane. Dentition intact. No abscess noted.  EAR, NOSE, THROAT: Clear without exudates. No external lesions.  NECK: Supple. No thyromegaly. No nodules. No JVD.  PULMONARY: Right sided coarse rhonchi with scattered expiratory wheezing No use of accessory muscles, Good respiratory effort. good air entry bilaterally CHEST: Nontender to palpation.  CARDIOVASCULAR: S1 and S2. Regular rate and rhythm. No murmurs, rubs, or gallops. Trace edema. Pedal pulses 2+ bilaterally.  GASTROINTESTINAL: Soft, nontender, nondistended. No masses. Positive bowel sounds. No hepatosplenomegaly.  MUSCULOSKELETAL: No swelling, clubbing, or edema. Range of motion full in all extremities.  NEUROLOGIC: Cranial nerves II through XII are intact. No gross focal neurological deficits. Sensation intact. Reflexes intact.  SKIN: No ulceration, lesions, rashes, or cyanosis. Skin warm and dry. Turgor intact.  PSYCHIATRIC: Mood, affect within normal limits. The patient is awake, alert and oriented x 3. Insight, judgment intact.    LABORATORY PANEL:   CBC  Recent Labs Lab 01/06/16 1334  WBC 5.0  HGB 14.0  HCT 41.1  PLT 172   ------------------------------------------------------------------------------------------------------------------  Chemistries   Recent Labs Lab 01/06/16 1334  NA 136  K 2.6*  CL 94*  CO2 33*  GLUCOSE 125*  BUN 17  CREATININE 1.18  CALCIUM 9.2    ------------------------------------------------------------------------------------------------------------------  Cardiac Enzymes No results for input(s): TROPONINI in the last 168 hours. ------------------------------------------------------------------------------------------------------------------  RADIOLOGY:  Dg Chest 2 View  Result Date: 01/06/2016 CLINICAL DATA:  Cough and congestion EXAM: CHEST  2 VIEW COMPARISON:  11/09/2010 FINDINGS: Cardiac shadow is within normal limits. The lungs are well aerated bilaterally. Focal right basilar infiltrate is noted projecting in the right middle lobe on lateral projection. Bony structures are within normal limits. IMPRESSION: Right middle lobe infiltrate. Electronically Signed   By: Alcide CleverMark  Lukens M.D.   On: 01/06/2016 14:05    EKG:   Orders placed or performed during the hospital encounter of 01/06/16  . ED EKG  . ED EKG    IMPRESSION AND PLAN:   80 year old Caucasian gentleman history of essential hypertension Parkinson's presenting with cough weakness  1. Community acquired pneumonia: Levaquin, breathing treatments, steroids, oxygen as required, steroids given wheezing 2. Hypokalemia: Check magnesium level, replace potassium goal 4-5 3. Hypertensive urgency: Continue home medications at as needed hydralazine 4. Hyperlipidemia specifically: Statin therapy 5. Parkinson's: Continue Sinemet   All the records are reviewed and case discussed with ED provider. Management plans discussed with the patient, family and  they are in agreement.  CODE STATUS: Full  TOTAL TIME TAKING CARE OF THIS PATIENT: 33 minutes.    Hower,  Mardi Mainland.D on 01/06/2016 at 6:07 PM  Between 7am to 6pm - Pager - [phone]  After 6pm: House Pager: - [phone]  [facility] [place] [facility]  Office  [phone]  CC: Primary care physician; Kevin Bradshaw, Kevin Bradshaw

## 2016-01-06 NOTE — ED Notes (Signed)
Attempted to call report x 1  

## 2016-01-06 NOTE — ED Triage Notes (Signed)
Family states cough, productive, congestion, states increased urinary incontinence and foul smelling urine, states increased in leg swelling of recent, pt on HCTZ, states he was started on sinament recently and he had a fall and daughter choose to discontinue the medication, states since the fall he has been progressively weaker, awake and alert in no acute distress

## 2016-01-07 DIAGNOSIS — J189 Pneumonia, unspecified organism: Secondary | ICD-10-CM | POA: Diagnosis not present

## 2016-01-07 LAB — BASIC METABOLIC PANEL
Anion gap: 9 (ref 5–15)
BUN: 15 mg/dL (ref 6–20)
CHLORIDE: 96 mmol/L — AB (ref 101–111)
CO2: 33 mmol/L — AB (ref 22–32)
CREATININE: 1.18 mg/dL (ref 0.61–1.24)
Calcium: 9.2 mg/dL (ref 8.9–10.3)
GFR calc Af Amer: >60 mL/min (ref >60)
GFR calc non Af Amer: 57 mL/min — ABNORMAL LOW (ref >60)
GLUCOSE: 155 mg/dL — AB (ref 65–99)
POTASSIUM: 3.3 mmol/L — AB (ref 3.5–5.1)
SODIUM: 138 mmol/L (ref 135–145)

## 2016-01-07 LAB — CBC
HEMATOCRIT: 42.5 % (ref 40.0–52.0)
Hemoglobin: 14.2 g/dL (ref 13.0–18.0)
MCH: 30.1 pg (ref 26.0–34.0)
MCHC: 33.5 g/dL (ref 32.0–36.0)
MCV: 90 fL (ref 80.0–100.0)
Platelets: 185 10*3/uL (ref 150–440)
RBC: 4.72 MIL/uL (ref 4.40–5.90)
RDW: 13.6 % (ref 11.5–14.5)
WBC: 3.9 10*3/uL (ref 3.8–10.6)

## 2016-01-07 LAB — PROCALCITONIN: Procalcitonin: 0.11 ng/mL

## 2016-01-07 MED ORDER — POTASSIUM CHLORIDE CRYS ER 20 MEQ PO TBCR
40.0000 meq | EXTENDED_RELEASE_TABLET | Freq: Once | ORAL | Status: AC
  Administered 2016-01-07: 14:00:00 40 meq via ORAL
  Filled 2016-01-07: qty 2

## 2016-01-07 MED ORDER — MAGNESIUM SULFATE 2 GM/50ML IV SOLN
2.0000 g | Freq: Once | INTRAVENOUS | Status: AC
  Administered 2016-01-07: 2 g via INTRAVENOUS
  Filled 2016-01-07: qty 50

## 2016-01-07 NOTE — Progress Notes (Signed)
MEDICATION RELATED CONSULT NOTE - INITIAL   Pharmacy Consult for Electrolyte Monitoring Indication: Hypokalemia  No Known Allergies  Patient Measurements: Height: 5\' 3"  (160 cm) Weight: 163 lb 4 oz (74 kg) IBW/kg (Calculated) : 56.9  Vital Signs: Temp: 97.4 F (36.3 C) (12/27 0746) Temp Source: Oral (12/27 0746) BP: 152/95 (12/27 0746) Pulse Rate: 90 (12/27 0746) Intake/Output from previous day: 12/26 0701 - 12/27 0700 In: 410 [P.O.:360; IV Piggyback:50] Out: -  Intake/Output from this shift: Total I/O In: 120 [P.O.:120] Out: -   Labs:  Recent Labs  01/06/16 1334 01/07/16 0547  WBC 5.0 3.9  HGB 14.0 14.2  HCT 41.1 42.5  PLT 172 185  CREATININE 1.18 1.18  MG 1.8  --    Estimated Creatinine Clearance: 46.6 mL/min (by C-G formula based on SCr of 1.18 mg/dL).   Microbiology: Recent Results (from the past 720 hour(s))  Culture, blood (Routine X 2) w Reflex to ID Panel     Status: None (Preliminary result)   Collection Time: 01/06/16  2:36 PM  Result Value Ref Range Status   Specimen Description BLOOD  R AC  Final   Special Requests   Final    BOTTLES DRAWN AEROBIC AND ANAEROBIC  ANA 2 AER 4 ML   Culture NO GROWTH < 24 HOURS  Final   Report Status PENDING  Incomplete  Culture, blood (Routine X 2) w Reflex to ID Panel     Status: None (Preliminary result)   Collection Time: 01/06/16  2:36 PM  Result Value Ref Range Status   Specimen Description BLOOD L AC  Final   Special Requests BOTTLES DRAWN AEROBIC AND ANAEROBIC  ANA 17 AER 16  Final   Culture NO GROWTH < 24 HOURS  Final   Report Status PENDING  Incomplete    Medical History: Past Medical History:  Diagnosis Date  . Hyperlipidemia   . Hyperlipidemia   . Hypertension   . Parkinson disease [facility](HCC)     Assessment: Pharmacy consulted to assist in managing electrolytes in this 80 y/o M with a h/o HTN, HLD, and Parkinson's disease admitted with CAP.   Plan:  Magnesium sulfate 2 g iv once and KCl 40 meq  po once and f/u AM labs.   Luisa Harthristy, Daegen Berrocal D 01/07/2016,10:28 AM

## 2016-01-07 NOTE — Progress Notes (Addendum)
[facility] - [place] [facility]   PATIENT NAME: Kevin NordmannHenry Bradshaw    MR#:  [id number]  DATE OF BIRTH:  04/05/1935  SUBJECTIVE:   Came in with fall, weakness and sob. Found to have pneumonia REVIEW OF SYSTEMS:   Review of Systems  Constitutional: Negative for chills, fever and weight loss.  HENT: Negative for ear discharge, ear pain and nosebleeds.   Eyes: Negative for blurred vision, pain and discharge.  Respiratory: Positive for cough and shortness of breath. Negative for sputum production, wheezing and stridor.   Cardiovascular: Negative for chest pain, palpitations, orthopnea and PND.  Gastrointestinal: Negative for abdominal pain, diarrhea, nausea and vomiting.  Genitourinary: Negative for frequency and urgency.  Musculoskeletal: Positive for falls. Negative for back pain and joint pain.  Neurological: Positive for weakness. Negative for sensory change, speech change and focal weakness.  Psychiatric/Behavioral: Negative for depression and hallucinations. The patient is not nervous/anxious.    Tolerating Diet:yes Tolerating PT: rec snf  DRUG ALLERGIES:  No Known Allergies  VITALS:  Blood pressure (!) 152/95, pulse 90, temperature 97.4 F (36.3 C), temperature source Oral, resp. rate 18, height 5\' 3"  (1.6 m), weight 74 kg (163 lb 4 oz), SpO2 95 %.  PHYSICAL EXAMINATION:   Physical Exam  GENERAL:  80 y.o.-year-old patient lying in the bed with no acute distress.  EYES: Pupils equal, round, reactive to light and accommodation. No scleral icterus. Extraocular muscles intact.  HEENT: Head atraumatic, normocephalic. Oropharynx and nasopharynx clear.  NECK:  Supple, no jugular venous distention. No thyroid enlargement, no tenderness.  LUNGS: Normal breath sounds bilaterally, no wheezing, rales, rhonchi. No use of accessory muscles of respiration.  CARDIOVASCULAR: S1, S2 normal. No murmurs, rubs, or gallops.  ABDOMEN: Soft, nontender, nondistended. Bowel  sounds present. No organomegaly or mass.  EXTREMITIES: No cyanosis, clubbing or edema b/l.    NEUROLOGIC: Cranial nerves II through XII are intact. No focal Motor or sensory deficits b/l.   PSYCHIATRIC:  patient is alert and oriented x 3.  SKIN: No obvious rash, lesion, or ulcer.   LABORATORY PANEL:  CBC  Recent Labs Lab 01/07/16 0547  WBC 3.9  HGB 14.2  HCT 42.5  PLT 185    Chemistries   Recent Labs Lab 01/06/16 1334 01/07/16 0547  NA 136 138  K 2.6* 3.3*  CL 94* 96*  CO2 33* 33*  GLUCOSE 125* 155*  BUN 17 15  CREATININE 1.18 1.18  CALCIUM 9.2 9.2  MG 1.8  --    Cardiac Enzymes No results for input(s): TROPONINI in the last 168 hours. RADIOLOGY:  Dg Chest 2 View  Result Date: 01/06/2016 CLINICAL DATA:  Cough and congestion EXAM: CHEST  2 VIEW COMPARISON:  11/09/2010 FINDINGS: Cardiac shadow is within normal limits. The lungs are well aerated bilaterally. Focal right basilar infiltrate is noted projecting in the right middle lobe on lateral projection. Bony structures are within normal limits. IMPRESSION: Right middle lobe infiltrate. Electronically Signed   By: Alcide CleverMark  Lukens M.D.   On: 01/06/2016 14:05   ASSESSMENT AND PLAN:  Kevin NordmannHenry Foree  is a 80 y.o. male with a known history of Essential hypertension, Parkinson's disease who is presenting with cough and weakness. Describes 3-4 day duration of cough nonproductive denies fevers chills associated shortness of breath.  1. Community acquired pneumonia:  -cont Levaquin, breathing treatments, steroids, oxygen as required, steroids given wheezing  2. Hypokalemia: repleting  magnesium level wnl  3. Hypertensive urgency: Continue home medications at as  needed hydralazine  4. Hyperlipidemia specifically: Statin therapy  5. Parkinson's: Continue Sinemet  PT to see pt  Case discussed with Care Management/Social Worker. Management plans discussed with the patient, family and they are in agreement.  CODE STATUS:  full  DVT Prophylaxis: lovenox  TOTAL TIME TAKING CARE OF THIS PATIENT: 30 minutes.  >50% time spent on counselling and coordination of care  POSSIBLE D/C IN 1-2 DAYS, DEPENDING ON CLINICAL CONDITION.  Note: This dictation was prepared with Dragon dictation along with smaller phrase technology. Any transcriptional errors that result from this process are unintentional.  Edessa Jakubowicz M.D on 01/07/2016 at 12:44 PM  Between 7am to 6pm - Pager - [phone]  After 6pm go to [email or web address] - password [email or web address]  [facility]  Office  [phone]  CC: Primary care physician; Wynona DoveWALKER,JENNIFER AZBELL, MD

## 2016-01-07 NOTE — Care Management Obs Status (Signed)
MEDICARE OBSERVATION STATUS NOTIFICATION   Patient Details  Name: Kevin Bradshaw MRN: [id number] Date of Birth: 12/06/1935   Medicare Observation Status Notification Given:  Yes    Gwenette GreetBrenda S Aedan Geimer, RN 01/07/2016, 12:51 PM

## 2016-01-07 NOTE — Clinical Social Work Note (Addendum)
Clinical Social Work Assessment  Patient Details  Name: Kevin Bradshaw MRN: [id number] Date of Birth: 03-Dec-1935  Date of referral:  01/07/16               Reason for consult:  Discharge Planning                Permission sought to share information with:    Permission granted to share information::     Name::        Agency::     Relationship::     Contact Information:     Housing/Transportation Living arrangements for the past 2 months:  Single Family Home Source of Information:  Patient Patient Interpreter Needed:  None Criminal Activity/Legal Involvement Pertinent to Current Situation/Hospitalization:  No - Comment as needed Significant Relationships:  Adult Children Lives with:  Adult Children Do you feel safe going back to the place where you live?  Yes Need for family participation in patient care:  Yes (Comment)  Care giving concerns:  Patient lives in [place] with his daughter Kevin Bradshaw.    Social Worker assessment / plan:  Holiday representative ([place]) reviewed chart and noted that PT is recommending SNF. Patient is under Medicare observation. CSW met with patient to discuss D/C plan. Patient was alert and oriented and was sitting up in the chair. CSW introduced self and explained role of CSW department. Patient reported that he lives with his daughter Kevin Bradshaw. CSW explained that PT is recommending SNF however his Medicare will not pay for it because he is under observation and requires a 3 night qualifying inpatient stay. CSW explained that he could pay privately for SNF or go home with home health. Patient reported that he cannot afford to pay out of pocket for SNF and he will go home. CSW left patient's daughter Kevin Bradshaw a Advertising account executive. CSW sent RN case manager a message making her aware of above. CSW will continue to follow and assist as needed.    CSW received a call back from patient's daughter Kevin Bradshaw and made her aware of above. [name] is in agreement with patient returning home to her  house. Per [name] patient has home health PT through [facility] and she would like an Therapist, sports and RN aide to be added to home health.   Employment status:  Retired Forensic scientist:  Medicare PT Recommendations:  [place] / Referral to community resources:  Other (Comment Required) (Patient is under Medicare Observation. Patient will go home with home health. )  Patient/Family's Response to care:  Patient is agreeable to go home with home health.   Patient/Family's Understanding of and Emotional Response to Diagnosis, Current Treatment, and Prognosis:  Patient was very pleasant and thanked CSW for visit.   Emotional Assessment Appearance:  Appears stated age Attitude/Demeanor/Rapport:    Affect (typically observed):  Accepting, Adaptable, Pleasant Orientation:  Oriented to Self, Oriented to Place, Oriented to  Time, Oriented to Situation Alcohol / Substance use:  Not Applicable Psych involvement (Current and /or in the community):  No (Comment)  Discharge Needs  Concerns to be addressed:  Discharge Planning Concerns Readmission within the last 30 days:  No Current discharge risk:  Dependent with Mobility Barriers to Discharge:  Continued Medical Work up   [employer], Veronia Beets, LCSW 01/07/2016, 4:15 PM

## 2016-01-07 NOTE — Care Management (Signed)
Physical therapy evaluation completed. Recommending skilled nursing facility. Unable to pay for skilled facility care. To room to discuss home health. [name] in the bathroom, unable to discuss agency choices at this time. Gwenette GreetBrenda S Aariyana Manz RN MSN CCM Care Management

## 2016-01-07 NOTE — Care Management (Signed)
Admitted to [facility] with the diagnosis of pneumonia. Lives with daughter Ardeen Garlandamela Clement [phone]). Last seen Dr. Yates DecampJohn Walker 08/28/15. No home health. [facility] 6 years ago. No home oxygen. Rolling walker and wheelchair in the home. Last fall was a week ago. Good appetite. Takes care of all basic activities of daily living himself. No life Alert. Prescriptions are filled at [facility]. [place], will be looking for another pharmacy. Daughter will transport. Physical therapy evaluation pending further plans. Gwenette GreetBrenda S Yeudiel Mateo RN MSN CCM Care Management

## 2016-01-07 NOTE — Evaluation (Signed)
Physical Therapy Evaluation Patient Details Name: Kevin Bradshaw MRN: [id number] DOB: 02/27/1935 Today's Date: 01/07/2016   History of Present Illness  80 y.o. male with a known history of Essential hypertension, Parkinson's disease who is presenting with cough and weakness.  Pt has had increasing recent falls  Clinical Impression  Pt very much struggled with getting to sitting and standing and then was essentially unable to do any stepping but unsafely shuffled to recliner with heavy assist from PT.  He showed good effort but simply was too weak and unsteady to do much.     Follow Up Recommendations SNF    Equipment Recommendations       Recommendations for Other Services       Precautions / Restrictions Precautions Precautions: Fall Restrictions Weight Bearing Restrictions: No      Mobility  Bed Mobility Overal bed mobility: Needs Assistance Bed Mobility: Supine to Sit     Supine to sit: Mod assist     General bed mobility comments: Pt leaning backward needs a lot of intial assist and set up  Transfers Overall transfer level: Needs assistance Equipment used: Rolling walker (2 wheeled) Transfers: Sit to/from Stand Sit to Stand: Max assist         General transfer comment: Pt leaning backward heavily, unable to shift weight forward to take weight w/o considerable assist from PT  Ambulation/Gait             General Gait Details: Pt struggles with anything that could be considered ambulation, he was able to shuffle LEs with mod/max assist from PT enough to get to recliner but did not do any actual ambulation  Stairs            Wheelchair Mobility    Modified Rankin (Stroke Patients Only)       Balance Overall balance assessment: Needs assistance   Sitting balance-Leahy Scale: Poor       Standing balance-Leahy Scale: Poor                               Pertinent Vitals/Pain Pain Assessment:  (reports b/l leg pain, L>R  specifically in his quad)    Home Living Family/patient expects to be discharged to:: Skilled nursing facility Living Arrangements: Children             Home Equipment: Walker - 2 wheels      Prior Function Level of Independence: Needs assistance         Comments: Pt reports that for the last month he has been very weak and limited, was not especially active prior to that but could do some in-home walking     Hand Dominance        Extremity/Trunk Assessment   Upper Extremity Assessment Upper Extremity Assessment: Generalized weakness (limited over head ROM and grossly 3/5 otherwise)    Lower Extremity Assessment Lower Extremity Assessment: Generalized weakness (pain with some resisted acts, grossly 3/5)       Communication   Communication: No difficulties  Cognition Arousal/Alertness: Awake/alert Behavior During Therapy: Restless Overall Cognitive Status: Difficult to assess                 General Comments: Pt able to answer questions and show decent effort with requested acts but had some mild confusion    General Comments      Exercises     Assessment/Plan    PT Assessment Patient needs continued PT  services  PT Problem List Decreased strength;Decreased range of motion;Decreased activity tolerance;Decreased balance;Decreased mobility;Decreased coordination;Decreased cognition;Decreased safety awareness;Decreased knowledge of use of DME;Decreased knowledge of precautions          PT Treatment Interventions DME instruction;Stair training;Functional mobility training;Therapeutic activities;Therapeutic exercise;Neuromuscular re-education;Balance training;Cognitive remediation;Patient/family education    PT Goals (Current goals can be found in the Care Plan section)  Acute Rehab PT Goals Patient Stated Goal: get stronger PT Goal Formulation: With patient Time For Goal Achievement: 01/21/16 Potential to Achieve Goals: Fair    Frequency Min  2X/week   Barriers to discharge        Co-evaluation               End of Session Equipment Utilized During Treatment: Gait belt;Oxygen Activity Tolerance: Patient limited by fatigue;Patient limited by pain Patient left: with chair alarm set;with call bell/phone within reach      Functional Assessment Tool Used: clinical judgement Functional Limitation: Mobility: Walking and moving around Mobility: Walking and Moving Around Current Status ([id number](G8978): At least 80 percent but less than 100 percent impaired, limited or restricted Mobility: Walking and Moving Around Goal Status [phone](G8979): At least 40 percent but less than 60 percent impaired, limited or restricted    Time: 0854-0918 PT Time Calculation (min) (ACUTE ONLY): 24 min   Charges:   PT Evaluation $PT Eval Low Complexity: 1 Procedure     PT G Codes:   PT G-Codes **NOT FOR INPATIENT CLASS** Functional Assessment Tool Used: clinical judgement Functional Limitation: Mobility: Walking and moving around Mobility: Walking and Moving Around Current Status ([id number](G8978): At least 80 percent but less than 100 percent impaired, limited or restricted Mobility: Walking and Moving Around Goal Status [phone](G8979): At least 40 percent but less than 60 percent impaired, limited or restricted    Malachi ProGalen R Joslin Doell, DPT 01/07/2016, 1:05 PM

## 2016-01-08 ENCOUNTER — Telehealth: Payer: Self-pay | Admitting: Internal Medicine

## 2016-01-08 ENCOUNTER — Ambulatory Visit: Admission: RE | Admit: 2016-01-08 | Payer: Medicare Other | Source: Ambulatory Visit

## 2016-01-08 DIAGNOSIS — J189 Pneumonia, unspecified organism: Secondary | ICD-10-CM | POA: Diagnosis not present

## 2016-01-08 LAB — POTASSIUM: POTASSIUM: 3.3 mmol/L — AB (ref 3.5–5.1)

## 2016-01-08 LAB — MAGNESIUM: Magnesium: 2.3 mg/dL (ref 1.7–2.4)

## 2016-01-08 MED ORDER — TRAMADOL HCL 50 MG PO TABS: 50.0000 mg | ORAL_TABLET | Freq: Three times a day (TID) | ORAL | 0 refills | Status: DC | PRN

## 2016-01-08 MED ORDER — PREDNISONE 5 MG PO TABS
50.0000 mg | ORAL_TABLET | Freq: Every day | ORAL | Status: DC
  Administered 2016-01-08: 50 mg via ORAL
  Filled 2016-01-08: qty 2

## 2016-01-08 MED ORDER — POTASSIUM CHLORIDE CRYS ER 20 MEQ PO TBCR
40.0000 meq | EXTENDED_RELEASE_TABLET | Freq: Once | ORAL | Status: AC
  Administered 2016-01-08: 11:00:00 40 meq via ORAL
  Filled 2016-01-08: qty 2

## 2016-01-08 MED ORDER — PREDNISONE 10 MG PO TABS: 50.0000 mg | ORAL_TABLET | Freq: Every day | ORAL | 0 refills | Status: DC

## 2016-01-08 MED ORDER — LEVOFLOXACIN 250 MG PO TABS: 250.0000 mg | ORAL_TABLET | Freq: Every day | ORAL | 0 refills | Status: DC

## 2016-01-08 MED ORDER — GUAIFENESIN ER 600 MG PO TB12: 600.0000 mg | ORAL_TABLET | Freq: Two times a day (BID) | ORAL | 0 refills | Status: DC

## 2016-01-08 MED ORDER — TRAMADOL HCL 50 MG PO TABS: 50.0000 mg | ORAL_TABLET | Freq: Three times a day (TID) | ORAL | Status: DC | PRN

## 2016-01-08 MED ORDER — LEVOFLOXACIN 500 MG PO TABS
250.0000 mg | ORAL_TABLET | Freq: Every day | ORAL | Status: DC
  Administered 2016-01-08: 250 mg via ORAL
  Filled 2016-01-08: qty 1

## 2016-01-08 NOTE — Progress Notes (Signed)
Pt has been discharge home. Discharge papers given and explained to pt and son in law. Both verbalized understanding. F/U appointment and meds reviewed. RX given to son in law.

## 2016-01-08 NOTE — Telephone Encounter (Signed)
Patient still not home from hospital cannot make TCM call at this time. Dr. Adriana Simasook has agreed to see patient on Friday for hospital follow up or next week not to establish but for the TCM.

## 2016-01-08 NOTE — Telephone Encounter (Signed)
Kevin Bradshaw form [facility] called and needed to schedule a HFU. Pt was in for Community acquired pneumonia. Pt has not been seen since 2012 with Dr. Dan HumphreysWalker, he needs to establish with a new provider.  Please advise, thank you!  Call pt @ [phone]

## 2016-01-08 NOTE — Care Management Note (Signed)
Case Management Note  Patient Details  Name: Kevin Bradshaw MRN: [id number] Date of Birth: 09/23/1935  Subjective/Objective:    Discharging today                Action/Plan: [facility] notified of discharge  And resumption of care needs. SW spoke with daughter yesterday and she is in agreement with discharge plan. Primary nurse updated on POC this am.   Expected Discharge Date:   01/08/2016               Expected Discharge Plan:  Home w Home Health Services  In-House Referral:     Discharge planning Services  CM Consult  Post Acute Care Choice:  Home Health, Resumption of Svcs/PTA Provider Choice offered to:  Adult Children  DME Arranged:    DME Agency:     HH Arranged:  RN, PT, Nurse's Aide HH Agency:  [facility]  Status of Service:  Completed, signed off  If discussed at Long Length of Stay Meetings, dates discussed:    Additional Comments:  Marily MemosLisa M Ugonna Keirsey, RN 01/08/2016, 9:53 AM

## 2016-01-08 NOTE — Discharge Summary (Signed)
[facility] [place] [facility]   PATIENT NAME: Kevin NordmannHenry Bradshaw    MR#:  [id number]  DATE OF BIRTH:  06/03/1935  DATE OF ADMISSION:  01/06/2016 ADMITTING PHYSICIAN: Wyatt Hasteavid K Hower, MD  DATE OF DISCHARGE: 01/08/16  PRIMARY CARE PHYSICIAN: Wynona DoveWALKER,JENNIFER AZBELL, MD    ADMISSION DIAGNOSIS:  Acute hypokalemia [E87.6] Community acquired pneumonia of right middle lobe of lung (HCC) [J18.1]  DISCHARGE DIAGNOSIS:  Pneumonia Generalized weakness  SECONDARY DIAGNOSIS:   Past Medical History:  Diagnosis Date  . Hyperlipidemia   . Hyperlipidemia   . Hypertension   . Parkinson disease [facility](HCC)     HOSPITAL COURSE:  Kevin Bradshaw a 80 y.o.malewith a known history of Essential hypertension, Parkinson's disease who is presenting with cough and weakness. Describes 3-4 day duration of cough nonproductive denies fevers chills associated shortness of breath.  1. Community acquired pneumonia:  -cont Levaquin, breathing treatments, steroids, oxygen as required, steroids given wheezing  2. Hypokalemia: repleting  magnesium level wnl  3. Hypertensive urgency:resolved  Continue home medications -and as needed hydralazine  4. Hyperlipidemia - Statin therapy  5. Parkinson's: Continue Sinemet  PT recommends SNF and since he is observation he will not be able to afford. Pt and dter agreeable to go home with HHPT.  CONSULTS OBTAINED:  Treatment Team:  Wyatt Hasteavid K Hower, MD  DRUG ALLERGIES:  No Known Allergies  DISCHARGE MEDICATIONS:   Current Discharge Medication List    START taking these medications   Details  guaiFENesin (MUCINEX) 600 MG 12 hr tablet Take 1 tablet (600 mg total) by mouth 2 (two) times daily. Qty: 14 tablet, Refills: 0    levofloxacin (LEVAQUIN) 250 MG tablet Take 1 tablet (250 mg total) by mouth daily. Qty: 5 tablet, Refills: 0    predniSONE (DELTASONE) 10 MG tablet Take 5 tablets (50 mg total) by mouth daily with breakfast. Start  50 mg daily taper by 10 mg daily then stop Qty: 15 tablet, Refills: 0    traMADol (ULTRAM) 50 MG tablet Take 1 tablet (50 mg total) by mouth every 8 (eight) hours as needed for moderate pain or severe pain. Qty: 30 tablet, Refills: 0      CONTINUE these medications which have NOT CHANGED   Details  amLODipine (NORVASC) 5 MG tablet Take 5 mg by mouth daily.    aspirin 81 MG tablet Take 81 mg by mouth daily.      carbidopa-levodopa (SINEMET IR) 25-100 MG tablet Take 1 tablet by mouth 3 (three) times daily.    gabapentin (NEURONTIN) 100 MG capsule Take 200 mg by mouth 3 (three) times daily.    hydrochlorothiazide (HYDRODIURIL) 25 MG tablet Take 25 mg by mouth daily.    ibuprofen (ADVIL,MOTRIN) 200 MG tablet Take 200 mg by mouth every 6 (six) hours as needed.    lovastatin (MEVACOR) 40 MG tablet Take 40 mg by mouth at bedtime.     chlorpheniramine (CHLOR-TRIMETON) 4 MG tablet Take 1 tablet (4 mg total) by mouth every 6 (six) hours as needed. Qty: 14 tablet        If you experience worsening of your admission symptoms, develop shortness of breath, life threatening emergency, suicidal or homicidal thoughts you must seek medical attention immediately by calling 911 or calling your MD immediately  if symptoms less severe.  You Must read complete instructions/literature along with all the possible adverse reactions/side effects for all the Medicines you take and that have been prescribed to you. Take any new Medicines after  you have completely understood and accept all the possible adverse reactions/side effects.   Please note  You were cared for by a hospitalist during your hospital stay. If you have any questions about your discharge medications or the care you received while you were in the hospital after you are discharged, you can call the unit and asked to speak with the hospitalist on call if the hospitalist that took care of you is not available. Once you are discharged, your  primary care physician will handle any further medical issues. Please note that NO REFILLS for any discharge medications will be authorized once you are discharged, as it is imperative that you return to your primary care physician (or establish a relationship with a primary care physician if you do not have one) for your aftercare needs so that they can reassess your need for medications and monitor your lab values. Today   SUBJECTIVE   Doing well  VITAL SIGNS:  Blood pressure 115/74, pulse 87, temperature 97.9 F (36.6 C), temperature source Oral, resp. rate 20, height 5\' 3"  (1.6 m), weight 74 kg (163 lb 4 oz), SpO2 98 %.  I/O:   Intake/Output Summary (Last 24 hours) at 01/08/16 0755 Last data filed at 01/08/16 0500  Gross per 24 hour  Intake              980 ml  Output              450 ml  Net              530 ml    PHYSICAL EXAMINATION:  GENERAL:  80 y.o.-year-old patient lying in the bed with no acute distress.  EYES: Pupils equal, round, reactive to light and accommodation. No scleral icterus. Extraocular muscles intact.  HEENT: Head atraumatic, normocephalic. Oropharynx and nasopharynx clear.  NECK:  Supple, no jugular venous distention. No thyroid enlargement, no tenderness.  LUNGS: Normal breath sounds bilaterally, no wheezing, rales,rhonchi or crepitation. No use of accessory muscles of respiration.  CARDIOVASCULAR: S1, S2 normal. No murmurs, rubs, or gallops.  ABDOMEN: Soft, non-tender, non-distended. Bowel sounds present. No organomegaly or mass.  EXTREMITIES: No pedal edema, cyanosis, or clubbing.  NEUROLOGIC: Cranial nerves II through XII are intact. Muscle strength 5/5 in all extremities. Sensation intact. Gait not checked.  PSYCHIATRIC: The patient is alert and oriented x 3.  SKIN: No obvious rash, lesion, or ulcer.   DATA REVIEW:   CBC   Recent Labs Lab 01/07/16 0547  WBC 3.9  HGB 14.2  HCT 42.5  PLT 185    Chemistries   Recent Labs Lab  01/07/16 0547 01/08/16 0520  NA 138  --   K 3.3* 3.3*  CL 96*  --   CO2 33*  --   GLUCOSE 155*  --   BUN 15  --   CREATININE 1.18  --   CALCIUM 9.2  --   MG  --  2.3    Microbiology Results   Recent Results (from the past 240 hour(s))  Culture, blood (Routine X 2) w Reflex to ID Panel     Status: None (Preliminary result)   Collection Time: 01/06/16  2:36 PM  Result Value Ref Range Status   Specimen Description BLOOD  R AC  Final   Special Requests   Final    BOTTLES DRAWN AEROBIC AND ANAEROBIC  ANA 2 AER 4 ML   Culture NO GROWTH < 24 HOURS  Final   Report Status PENDING  Incomplete  Culture, blood (Routine X 2) w Reflex to ID Panel     Status: None (Preliminary result)   Collection Time: 01/06/16  2:36 PM  Result Value Ref Range Status   Specimen Description BLOOD L AC  Final   Special Requests BOTTLES DRAWN AEROBIC AND ANAEROBIC  ANA 17 AER 16  Final   Culture NO GROWTH < 24 HOURS  Final   Report Status PENDING  Incomplete    RADIOLOGY:  Dg Chest 2 View  Result Date: 01/06/2016 CLINICAL DATA:  Cough and congestion EXAM: CHEST  2 VIEW COMPARISON:  11/09/2010 FINDINGS: Cardiac shadow is within normal limits. The lungs are well aerated bilaterally. Focal right basilar infiltrate is noted projecting in the right middle lobe on lateral projection. Bony structures are within normal limits. IMPRESSION: Right middle lobe infiltrate. Electronically Signed   By: Alcide CleverMark  Lukens M.D.   On: 01/06/2016 14:05     Management plans discussed with the patient, family and they are in agreement.  CODE STATUS:     Code Status Orders        Start     Ordered   01/06/16 1753  Full code  Continuous     01/06/16 1752    Code Status History    Date Active Date Inactive Code Status Order ID Comments User Context   This patient has a current code status but no historical code status.      TOTAL TIME TAKING CARE OF THIS PATIENT:40 minutes.    Jamieka Royle M.D on 01/08/2016 at 7:55  AM  Between 7am to 6pm - Pager - [phone] After 6pm go to [email or web address] - password [email or web address] [facility]  [facility]  Office  [phone]  CC: Primary care physician; Wynona DoveWALKER,JENNIFER AZBELL, MD

## 2016-01-09 NOTE — Telephone Encounter (Signed)
Spoke with patient's daughter, [facility], now, no TCM or call made due to patient seeing them for follow up. Thanks

## 2016-01-11 LAB — CULTURE, BLOOD (ROUTINE X 2)
Culture: NO GROWTH
Culture: NO GROWTH

## 2016-01-28 ENCOUNTER — Ambulatory Visit: Admission: RE | Admit: 2016-01-28 | Payer: Medicare Other | Source: Ambulatory Visit

## 2016-02-06 ENCOUNTER — Ambulatory Visit
Admission: RE | Admit: 2016-02-06 | Discharge: 2016-02-06 | Disposition: A | Discharge status: Home / Self Care | Payer: Medicare Other | Source: Ambulatory Visit | Attending: Neurology | Admitting: Neurology

## 2016-02-06 ENCOUNTER — Ambulatory Visit: Payer: Self-pay

## 2016-02-06 DIAGNOSIS — M5126 Other intervertebral disc displacement, lumbar region: Secondary | ICD-10-CM | POA: Diagnosis not present

## 2016-02-06 DIAGNOSIS — M79605 Pain in left leg: Secondary | ICD-10-CM

## 2016-02-06 DIAGNOSIS — M79604 Pain in right leg: Secondary | ICD-10-CM

## 2016-02-06 DIAGNOSIS — M48061 Spinal stenosis, lumbar region without neurogenic claudication: Secondary | ICD-10-CM | POA: Insufficient documentation

## 2016-02-06 DIAGNOSIS — Z981 Arthrodesis status: Secondary | ICD-10-CM | POA: Insufficient documentation

## 2016-02-06 DIAGNOSIS — R262 Difficulty in walking, not elsewhere classified: Secondary | ICD-10-CM

## 2016-02-13 ENCOUNTER — Ambulatory Visit (INDEPENDENT_AMBULATORY_CARE_PROVIDER_SITE_OTHER): Payer: Medicare Other | Admitting: Vascular Surgery

## 2016-04-06 ENCOUNTER — Encounter: Payer: Self-pay | Admitting: Neurology

## 2016-05-04 NOTE — Progress Notes (Signed)
Kevin Bradshaw was seen today in the movement disorders clinic for neurologic consultation at the request of Dr. Malvin Johns.  His PCP is Rafael Bihari, MD.  This patient is accompanied in the office by his son in law who supplements the history.  The consultation is for the evaluation of leg pain and trouble ambulating and to r/o PD.  The records that were made available to me were reviewed.  Pt started seeing Dr. Malvin Johns in 08/2015.  Initially, it was felt that the symptoms perhaps represented vascular claudication, but the patient saw a vascular surgeon and apparently that workup was unremarkable.  Subsequently, Dr. Malvin Johns recommended that the patient undergo an MRI of the lumbar spine to rule out neurogenic claudication.  In the meantime, the patient went to physical therapy and the physical therapists expressed concerns for Parkinson's disease.  The patient was started on levodopa in December, 2017 because of this.  He ultimately had his MRI of the lumbar spine in January, 2018.  I had the opportunity to review this.  This demonstrated laminectomies at L3-4, L4-5, and L5-S1.  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  He is to have shots in the back at the end of May.  Gabapentin was prescribed which has helped the pain but has not helped his walking or balance.  He did complete PT at the end of February and continues to take carbidopa/levodopa 25/100, which has been increased to 2 po tid (8:30am/12pm-2pm/6pm).  He isn't sure it is helping  Specific Symptoms:  Tremor: No. Family hx of similar:  No. Voice: raspy, deeper Sleep: sleeps well  Vivid Dreams:  Yes.    Acting out dreams:  No. Wet Pillows: No. Postural symptoms:  Yes.   (generally grabs onto furniture at home but [facility] in public)  Falls?  Yes.   (last one yesterday in the bathroom but didn't get hurt; may go one month without falling; lots of near falls) Bradykinesia symptoms: difficulty with  initiating movement, shuffling gait, slow movements and difficulty getting out of a chair Loss of smell:  No. Loss of taste:  No. but twice a day he gets a "funny taste" in the mouth but he cannot describe it Urinary Incontinence:  No. Difficulty Swallowing:  No. Handwriting, micrographia: No. but family states small Trouble with ADL's:  No.  Trouble buttoning clothing: No. Depression:  No. Memory changes:  Yes.   (rarely drives - family doesn't let him but he "snuck off"; son in law gives each dose of medication; lives with son in law since 11/2015) Hallucinations:  No. (none visual but has some of wife calling him, who has now passed away)  visual distortions: No. N/V:  No. Lightheaded:  No.  Syncope: No. Diplopia:  No. Dyskinesia:  No.  Neuroimaging of the brain has not previously been performed.    ALLERGIES:  No Known Allergies  CURRENT MEDICATIONS:  Outpatient Encounter Prescriptions as of 05/06/2016  Medication Sig  . amLODipine (NORVASC) 5 MG tablet Take 5 mg by mouth daily.  Marland Kitchen aspirin 81 MG tablet Take 81 mg by mouth daily.    . carbidopa-levodopa (SINEMET IR) 25-100 MG tablet Take 2 tablets by mouth 3 (three) times daily.   Marland Kitchen gabapentin (NEURONTIN) 100 MG capsule Take 200 mg by mouth 3 (three) times daily.  . hydrochlorothiazide (HYDRODIURIL) 25 MG tablet Take 25 mg by mouth daily.  Marland Kitchen lovastatin (MEVACOR) 40 MG tablet Take 40  mg by mouth at bedtime.   . [DISCONTINUED] chlorpheniramine (CHLOR-TRIMETON) 4 MG tablet Take 1 tablet (4 mg total) by mouth every 6 (six) hours as needed. (Patient not taking: Reported on 01/06/2016)  . [DISCONTINUED] guaiFENesin (MUCINEX) 600 MG 12 hr tablet Take 1 tablet (600 mg total) by mouth 2 (two) times daily.  . [DISCONTINUED] ibuprofen (ADVIL,MOTRIN) 200 MG tablet Take 200 mg by mouth every 6 (six) hours as needed.  . [DISCONTINUED] levofloxacin (LEVAQUIN) 250 MG tablet Take 1 tablet (250 mg total) by mouth daily.  . [DISCONTINUED]  predniSONE (DELTASONE) 10 MG tablet Take 5 tablets (50 mg total) by mouth daily with breakfast. Start 50 mg daily taper by 10 mg daily then stop  . [DISCONTINUED] traMADol (ULTRAM) 50 MG tablet Take 1 tablet (50 mg total) by mouth every 8 (eight) hours as needed for moderate pain or severe pain.   No facility-administered encounter medications on file as of 05/06/2016.     PAST MEDICAL HISTORY:   Past Medical History:  Diagnosis Date  . Hyperlipidemia   . Hyperlipidemia   . Hypertension   . Parkinson disease (HCC)     PAST SURGICAL HISTORY:   Past Surgical History:  Procedure Laterality Date  . CARDIOVASCULAR STRESS TEST     normal  . CARPAL TUNNEL RELEASE  2009   right hand  . X-STOP IMPLANTATION    . X-STOP IMPLANTATION     Dr. Gerrit Heck    SOCIAL HISTORY:   Social History   Social History  . Marital status: Widowed    Spouse name: N/A  . Number of children: 3  . Years of education: N/A   Occupational History  . Retired Retired    Administrator   Social History Main Topics  . Smoking status: Former Smoker    Packs/day: 1.50    Years: 50.00    Types: Cigarettes    Quit date: 11/04/2002  . Smokeless tobacco: Never Used  . Alcohol use Yes     Comment: once a week  . Drug use: No  . Sexual activity: Not on file   Other Topics Concern  . Not on file   Social History Narrative  . No narrative on file    FAMILY HISTORY:   Family Status  Relation Status  . Mother Deceased  . Father Deceased  . Sister Deceased  . Brother Deceased  . Brother Alive  . Child Alive    ROS:  A complete 10 system review of systems was obtained and was unremarkable apart from what is mentioned above.  PHYSICAL EXAMINATION:    VITALS:   Vitals:   05/06/16 1310  BP: 130/60  Pulse: 66  SpO2: 93%  Weight: 157 lb (71.2 kg)  Height:  (1.575 m)    GEN:  The patient appears stated age and is in NAD. HEENT:  Normocephalic, atraumatic.  The mucous membranes are  moist. The superficial temporal arteries are without ropiness or tenderness. CV:  RRR Lungs:  CTAB Neck/HEME:  There are no carotid bruits bilaterally.  Neurological examination:  Orientation: The patient is alert and oriented x3. Fund of knowledge is appropriate.  Recent and remote memory are intact.  Attention and concentration are normal.    Able to name objects and repeat phrases. Cranial nerves: There is good facial symmetry. Pupils are nonreactive.  Fundoscopic exam is attempted but the disc margins are not well visualized bilaterally. Extraocular muscles are intact. The visual fields are full to confrontational testing.  The speech is fluent and clear. Soft palate rises symmetrically and there is no tongue deviation. Hearing is intact to conversational tone. Sensation: Sensation is intact to light and pinprick throughout (facial, trunk, extremities). Vibration is markedly decreased in a distal fashion and virtually absent in a distal fashion. There is no extinction with double simultaneous stimulation. There is no sensory dermatomal level identified. Motor: Strength is 5/5 in the bilateral upper and lower extremities.   Shoulder shrug is equal and symmetric.  There is no pronator drift. Deep tendon reflexes: Deep tendon reflexes are 2/4 at the bilateral biceps, triceps, brachioradialis, left patella, 1/4 at the right patella with jendrassik and absent at the bilateral achilles. Plantar responses are downgoing bilaterally.  Movement examination: Tone: There is normal tone in the bilateral upper extremities.  The tone in the lower extremities is normal.  Abnormal movements: none Coordination:  There is no decremation with RAM's, with any form of RAMS, including alternating supination and pronation of the forearm, hand opening and closing, finger taps, heel taps and toe taps. Gait and Station: The patient has no difficulty arising out of a deep-seated chair without the use of the hands. The  patient's stride length is decreased.  He shuffles and festinates.  He has start hesitation.   He stops in doorways.  He turns en bloc  ASSESSMENT/PLAN:  1.  Vascular Parkinsonism  -I had a long discussion with the patient and family.  I told the patient that I do not see evidence of idiopathic parkinsons disease but I do think that it is possible that the patient has vascular parkinsonism.  I did tell them that it is possible that his disease was "covered up" by medication as he was examined on carbidopa/levodopa today.  He didn't think that it was helpful nor did his son in law.  Vascular parkinsonism is evidenced by the fact that the patient looks pretty good clinically when sitting down but looks more parkinsonian when ambulating, with short shuffling steps.  I talked to the patient about the difference between idiopathic Parkinson's disease and vascular parkinsonism.  I told the patient that carbidopa/levodopa only works about 30% of the time in patients with vascular parkinsonism, and in those patients that it does work, it usually only works for a few years.  The patient would like to try to d/c his medication.  I did offer levodopa challenge as well as DaT scan but decided to hold.  I will examine him next visit off of levodopa.  I did encourage walker at all times and encouraged him to try a recumbant bike for exercise.    2.  Lumbar radiculopathy  -pt to have injection soon.  I did tell him that this will not help his shuffling gait..  After I told him this he stated that he was going to reconsider having it done.  3.  f/u with Dr. Malvin Johns as previously scheduled.  Much greater than 50% of this visit was spent in counseling and coordinating care.  Total face to face time:  60 min  This did not include the 35 min of time I spent in review of records which was non face to face time.  Cc:  Rafael Bihari, MD

## 2016-05-06 ENCOUNTER — Encounter: Payer: Self-pay | Admitting: Neurology

## 2016-05-06 ENCOUNTER — Ambulatory Visit (INDEPENDENT_AMBULATORY_CARE_PROVIDER_SITE_OTHER): Payer: Medicare Other | Admitting: Neurology

## 2016-05-06 VITALS — BP 130/60 | HR 66 | Ht 62.0 in | Wt 157.0 lb

## 2016-05-06 DIAGNOSIS — M5416 Radiculopathy, lumbar region: Secondary | ICD-10-CM | POA: Diagnosis not present

## 2016-05-06 DIAGNOSIS — G214 Vascular parkinsonism: Secondary | ICD-10-CM

## 2016-05-06 NOTE — Patient Instructions (Signed)
1.  Stop your carbidopa/levodopa  2.  Start riding on a stationary bike  3.  Your tentative diagnosis is vascular parkinsonism.  4.  Remember that we could do a levodopa challenge test (where we examine you off and on levodopa as well discussed) or a DaT scan (which measures dopamine in the brain).  We decided to hold on that today  5.  Use your walker at all times and walk within the walker

## 2016-05-06 NOTE — Progress Notes (Signed)
Note sent to Dr. Malvin Johns.

## 2016-07-20 NOTE — Progress Notes (Signed)
Kevin Bradshaw was seen today in the movement disorders clinic for neurologic consultation at the request of Dr. Malvin Johns.  His PCP is Rafael Bihari, MD.  This patient is accompanied in the office by his son in law who supplements the history.  The consultation is for the evaluation of leg pain and trouble ambulating and to r/o PD.  The records that were made available to me were reviewed.  Pt started seeing Dr. Malvin Johns in 08/2015.  Initially, it was felt that the symptoms perhaps represented vascular claudication, but the patient saw a vascular surgeon and apparently that workup was unremarkable.  Subsequently, Dr. Malvin Johns recommended that the patient undergo an MRI of the lumbar spine to rule out neurogenic claudication.  In the meantime, the patient went to physical therapy and the physical therapists expressed concerns for Parkinson's disease.  The patient was started on levodopa in December, 2017 because of this.  He ultimately had his MRI of the lumbar spine in January, 2018.  I had the opportunity to review this.  This demonstrated laminectomies at L3-4, L4-5, and L5-S1.  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  He is to have shots in the back at the end of May.  Gabapentin was prescribed which has helped the pain but has not helped his walking or balance.  He did complete PT at the end of February and continues to take carbidopa/levodopa 25/100, which has been increased to 2 po tid (8:30am/12pm-2pm/6pm).  He isn't sure it is helping  07/21/16 update:  Patient seen today in follow-up.  He is accompanied by his son in law who supplements the history.  He has not been seen by Dr. Malvin Johns since our last visit.  He decided to discontinue his levodopa.  He noticed no problem doing that.  He did not think it was helping.  He did not wish to proceed with a levodopa challenge test or DaT scan.   Had few falls since last visit due to feet sticking to ground and  festination.  No fx.  No hallucinations.  No diplopia.  Son c/o occasionally "jerking" in hands and legs.  On gabapentin 200mg  bid.  Was having pain in legs but none for long time  ALLERGIES:  No Known Allergies  CURRENT MEDICATIONS:  Outpatient Encounter Prescriptions as of 07/21/2016  Medication Sig  . amLODipine (NORVASC) 5 MG tablet Take 5 mg by mouth daily.  Marland Kitchen aspirin 81 MG tablet Take 81 mg by mouth daily.    Marland Kitchen gabapentin (NEURONTIN) 100 MG capsule Take 200 mg by mouth 3 (three) times daily.  . hydrochlorothiazide (HYDRODIURIL) 25 MG tablet Take 25 mg by mouth daily.  Marland Kitchen lovastatin (MEVACOR) 40 MG tablet Take 40 mg by mouth at bedtime.   . [DISCONTINUED] carbidopa-levodopa (SINEMET IR) 25-100 MG tablet Take 2 tablets by mouth 3 (three) times daily.    No facility-administered encounter medications on file as of 07/21/2016.     PAST MEDICAL HISTORY:   Past Medical History:  Diagnosis Date  . Hyperlipidemia   . Hyperlipidemia   . Hypertension   . Parkinson disease (HCC)     PAST SURGICAL HISTORY:   Past Surgical History:  Procedure Laterality Date  . CARDIOVASCULAR STRESS TEST     normal  . CARPAL TUNNEL RELEASE  2009   right hand  . X-STOP IMPLANTATION    . X-STOP IMPLANTATION     Dr. Gerrit Heck  SOCIAL HISTORY:   Social History   Social History  . Marital status: Widowed    Spouse name: N/A  . Number of children: 3  . Years of education: N/A   Occupational History  . Retired Retired    Administrator   Social History Main Topics  . Smoking status: Former Smoker    Packs/day: 1.50    Years: 50.00    Types: Cigarettes    Quit date: 11/04/2002  . Smokeless tobacco: Never Used  . Alcohol use Yes     Comment: once a week  . Drug use: No  . Sexual activity: Not on file   Other Topics Concern  . Not on file   Social History Narrative  . No narrative on file    FAMILY HISTORY:   Family Status  Relation Status  . Mother Deceased  . Father  Deceased  . Sister Deceased  . Brother Deceased  . Brother Alive  . Child Alive    ROS:  A complete 10 system review of systems was obtained and was unremarkable apart from what is mentioned above.  PHYSICAL EXAMINATION:    VITALS:   Vitals:   07/21/16 1008  BP: (!) 86/58  Pulse: 82  SpO2: 90%  Weight: 158 lb (71.7 kg)  Height: 5\' 2"  (1.575 m)    GEN:  The patient appears stated age and is in NAD. HEENT:  Normocephalic, atraumatic.  The mucous membranes are moist. The superficial temporal arteries are without ropiness or tenderness. CV:  RRR Lungs:  CTAB Neck/HEME:  There are no carotid bruits bilaterally.  Neurological examination:  Orientation: The patient is alert and oriented x3. Fund of knowledge is appropriate.  Recent and remote memory are intact.  Attention and concentration are normal.    Able to name objects and repeat phrases. Cranial nerves: There is good facial symmetry. Pupils are nonreactive.  Fundoscopic exam is attempted but the disc margins are not well visualized bilaterally. Extraocular muscles are intact. The visual fields are full to confrontational testing. The speech is fluent and clear. Soft palate rises symmetrically and there is no tongue deviation. Hearing is intact to conversational tone. Sensation: Sensation is intact to light touch throughout Motor: Strength is 5/5 in the bilateral upper and lower extremities.   Shoulder shrug is equal and symmetric.  There is no pronator drift. .  Movement examination: Tone: There is normal tone in the bilateral upper extremities.  The tone in the lower extremities is normal.  Abnormal movements: none Coordination:  There is no decremation with RAM's, with any form of RAMS, including alternating supination and pronation of the forearm, hand opening and closing, finger taps, heel taps and toe taps. Gait and Station: The patient has no difficulty arising out of a deep-seated chair without the use of the hands. The  patient's stride length is decreased.  He shuffles and festinates.  He has start hesitation.   He stops in doorways.  He turns en bloc.  He does better with a walker and best with the rollator type of walker  ASSESSMENT/PLAN:  1.  Vascular Parkinsonism  -He is off of levodopa and noticed no difference getting off of it versus being on it.  This is fairly characteristic with vascular parkinsonism.  I again offered him a DaT scan distant case this represents an atypical state, but he decided to hold off on that.  We decided to just monitor him and follow him every 6 months, just in case  one should develop.  I feel confident, however, that this is likely just vascular parkinsonism.  He needs to use a walker at all times.  He and I discussed this in detail.  2.  Low BP  -BP very low in the office today initially but repeated and was normal.  May need to have BP meds evaluated but was much better on 2nd reading.  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  No longer having pain.  Will wean off gabapentin  4.  Follow-up in 6 months, sooner should new neurologic issues arise.  Much greater than 50% of this visit was spent in counseling and coordinating care.  Total face to face time:  30 min   Cc:  Rafael BihariWalker, John B III, MD

## 2016-07-21 ENCOUNTER — Encounter: Payer: Self-pay | Admitting: Neurology

## 2016-07-21 ENCOUNTER — Ambulatory Visit (INDEPENDENT_AMBULATORY_CARE_PROVIDER_SITE_OTHER): Payer: Medicare Other | Admitting: Neurology

## 2016-07-21 VITALS — BP 120/80 | HR 82 | Ht 62.0 in | Wt 158.0 lb

## 2016-07-21 DIAGNOSIS — G214 Vascular parkinsonism: Secondary | ICD-10-CM

## 2016-07-21 DIAGNOSIS — G253 Myoclonus: Secondary | ICD-10-CM

## 2016-07-21 DIAGNOSIS — I959 Hypotension, unspecified: Secondary | ICD-10-CM

## 2016-07-21 NOTE — Patient Instructions (Signed)
Decrease Gabapentin as follows:  Take one tablet twice daily for one week,  Then one tablet daily for one week,  Then stop.

## 2017-01-19 NOTE — Progress Notes (Signed)
Kevin Bradshaw was seen today in the movement disorders clinic for neurologic consultation at the request of Dr. Melrose Bradshaw.  His PCP is Kevin Hire, MD.  This patient is accompanied in the office by his son in law who supplements the history.  The consultation is for the evaluation of leg pain and trouble ambulating and to r/o PD.  The records that were made available to me were reviewed.  Pt started seeing Dr. Melrose Bradshaw in 08/2015.  Initially, it was felt that the symptoms perhaps represented vascular claudication, but the patient saw a vascular surgeon and apparently that workup was unremarkable.  Subsequently, Dr. Melrose Bradshaw recommended that the patient undergo an MRI of the lumbar spine to rule out neurogenic claudication.  In the meantime, the patient went to physical therapy and the physical therapists expressed concerns for Parkinson's disease.  The patient was started on levodopa in December, 2017 because of this.  He ultimately had his MRI of the lumbar spine in January, 2018.  I had the opportunity to review this.  This demonstrated laminectomies at L3-4, L4-5, and L5-S1.  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  He is to have shots in the back at the end of May.  Gabapentin was prescribed which has helped the pain but has not helped his walking or balance.  He did complete PT at the end of February and continues to take carbidopa/levodopa 25/100, which has been increased to 2 po tid (8:30am/12pm-2pm/6pm).  He isn't sure it is helping  07/21/16 update:  Patient seen today in follow-up.  He is accompanied by his son in law who supplements the history.  He has not been seen by Dr. Melrose Bradshaw since our last visit.  He decided to discontinue his levodopa.  He noticed no problem doing that.  He did not think it was helping.  He did not wish to proceed with a levodopa challenge test or DaT scan.   Had few falls since last visit due to feet sticking to ground and  festination.  No fx.  No hallucinations.  No diplopia.  Son c/o occasionally "jerking" in hands and legs.  On gabapentin 239m bid.  Was having pain in legs but none for long time  01/21/17 update: Patient is seen today in follow-up.  Pt with his son in law who supplements the history.  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  He is on no dopaminergic medications.  Levodopa provided no value.  No falls.  Some near falls.  No significant exercise.  Records have been reviewed since last visit.  He last saw his primary care provider on November 26, 2016.  ALLERGIES:  No Known Allergies  CURRENT MEDICATIONS:  Outpatient Encounter Medications as of 01/21/2017  Medication Sig  . amLODipine (NORVASC) 5 MG tablet Take 5 mg by mouth daily.  .Marland Kitchenaspirin 81 MG tablet Take 81 mg by mouth daily.    . hydrochlorothiazide (HYDRODIURIL) 25 MG tablet Take 25 mg by mouth daily.  .Marland Kitchenlovastatin (MEVACOR) 40 MG tablet Take 40 mg by mouth at bedtime.   . [DISCONTINUED] gabapentin (NEURONTIN) 100 MG capsule Take 200 mg by mouth 3 (three) times daily.   No facility-administered encounter medications on file as of 01/21/2017.     PAST MEDICAL HISTORY:   Past Medical History:  Diagnosis Date  . Hyperlipidemia   . Hyperlipidemia   . Hypertension   . [place]  PAST SURGICAL HISTORY:   Past Surgical History:  Procedure Laterality Date  . CARDIOVASCULAR STRESS TEST     normal  . CARPAL TUNNEL RELEASE  2009   right hand  . X-STOP IMPLANTATION    . X-STOP IMPLANTATION     Dr. Mauri Bradshaw    SOCIAL HISTORY:   Social History   Socioeconomic History  . Marital status: Widowed    Spouse name: Not on file  . Number of children: 3  . Years of education: Not on file  . Highest education level: Not on file  Social Needs  . Financial resource strain: Not on file  . Food insecurity - worry: Not on file  . Food insecurity - inability: Not on file  . Transportation needs - medical: Not on  file  . Transportation needs - non-medical: Not on file  Occupational History  . Occupation: Retired    Fish farm manager: retired    Comment: Therapist, occupational  Tobacco Use  . Smoking status: Former Smoker    Packs/day: 1.50    Years: 50.00    Pack years: 75.00    Types: Cigarettes    Last attempt to quit: 11/04/2002    Years since quitting: 14.2  . Smokeless tobacco: Never Used  Substance and Sexual Activity  . Alcohol use: Yes    Comment: once a week  . Drug use: No  . Sexual activity: Not on file  Other Topics Concern  . Not on file  Social History Narrative  . Not on file    FAMILY HISTORY:   Family Status  Relation Name Status  . Mother  Deceased  . Father  Deceased  . Sister  Deceased  . Brother 3 Deceased  . Brother 2 Alive  . Child 3 Alive    ROS:  A complete 10 system review of systems was obtained and was unremarkable apart from what is mentioned above.  PHYSICAL EXAMINATION:    VITALS:   Vitals:   01/21/17 1025  BP: 132/72  Pulse: 88  SpO2: 96%    GEN:  The patient appears stated age and is in NAD. HEENT:  Normocephalic, atraumatic.  The mucous membranes are moist. The superficial temporal arteries are without ropiness or tenderness. CV:  RRR Lungs:  CTAB Neck/HEME:  There are no carotid bruits bilaterally.  Neurological examination:  Orientation: The patient is alert and oriented x3.  Cranial nerves: There is good facial symmetry. Pupils are nonreactive.  Fundoscopic exam is attempted but the disc margins are not well visualized bilaterally. Extraocular muscles are intact. The visual fields are full to confrontational testing. The speech is fluent and clear. Soft palate rises symmetrically and there is no tongue deviation. Hearing is intact to conversational tone. Sensation: Sensation is intact to light touch throughout Motor: Strength is 5/5 in the bilateral upper and lower extremities.   Shoulder shrug is equal and symmetric.  There is no  pronator drift. .  Movement examination: Tone: There is normal tone in the bilateral upper extremities.  The tone in the lower extremities is normal.  Abnormal movements: none Coordination:  There is no decremation with RAM's, with any form of RAMS, including alternating supination and pronation of the forearm, hand opening and closing, finger taps, heel taps and toe taps. Gait and Station: The patient pushes off of the chair to arise.  He is given a walker.  He has start hesitation.  If reminded, he can have large steps.  If not, he shuffles and festinates.  ASSESSMENT/PLAN:  1.  Vascular Parkinsonism  -He is off of levodopa and noticed no difference getting off of it versus being on it.  This is fairly characteristic with vascular parkinsonism.  Patient and son in law asked me again about nature/pathophysiology of vascular parkinsonism and we discussed this.  Discussed role of PT/exercise.  Son in law didn't think that PT could be beneficial as patient doesn't do it once they leave.  Think RSB could be good for him.  Gave info on the [facility].  Discussed available scholarship programs.  Met with social worker  2.  Low BP  -BP very low in the office today initially but repeated and was normal.  May need to have BP meds evaluated but was much better on 2nd reading.  3.  Myoclonus  -resolved off of gabapentin  4.  F/u prn.  Much greater than 50% of this visit was spent in counseling and coordinating care.  Total face to face time:  25 min   Cc:  Kevin Hire, MD

## 2017-01-21 ENCOUNTER — Ambulatory Visit (INDEPENDENT_AMBULATORY_CARE_PROVIDER_SITE_OTHER): Payer: Medicare Other | Admitting: Neurology

## 2017-01-21 ENCOUNTER — Encounter: Payer: Self-pay | Admitting: Neurology

## 2017-01-21 VITALS — BP 132/72 | HR 88

## 2017-01-21 DIAGNOSIS — G214 Vascular parkinsonism: Secondary | ICD-10-CM | POA: Insufficient documentation

## 2017-08-11 DIAGNOSIS — I639 Cerebral infarction, unspecified: Secondary | ICD-10-CM

## 2017-08-11 HISTORY — DX: Cerebral infarction, unspecified: I63.9

## 2017-08-21 ENCOUNTER — Observation Stay: Payer: Medicare Other

## 2017-08-21 ENCOUNTER — Other Ambulatory Visit: Payer: Self-pay

## 2017-08-21 ENCOUNTER — Encounter: Payer: Self-pay | Admitting: Emergency Medicine

## 2017-08-21 ENCOUNTER — Inpatient Hospital Stay
Admission: EM | Admit: 2017-08-21 | Discharge: 2017-08-25 | DRG: 066 | Disposition: A | Discharge status: Skilled Nursing Facility | Payer: Medicare Other | Attending: Internal Medicine | Admitting: Internal Medicine

## 2017-08-21 ENCOUNTER — Emergency Department: Payer: Medicare Other

## 2017-08-21 DIAGNOSIS — R4781 Slurred speech: Secondary | ICD-10-CM | POA: Diagnosis present

## 2017-08-21 DIAGNOSIS — E876 Hypokalemia: Secondary | ICD-10-CM | POA: Diagnosis present

## 2017-08-21 DIAGNOSIS — I10 Essential (primary) hypertension: Secondary | ICD-10-CM | POA: Diagnosis present

## 2017-08-21 DIAGNOSIS — G20 Parkinson's disease: Secondary | ICD-10-CM | POA: Diagnosis present

## 2017-08-21 DIAGNOSIS — R29898 Other symptoms and signs involving the musculoskeletal system: Secondary | ICD-10-CM | POA: Diagnosis present

## 2017-08-21 DIAGNOSIS — Z87891 Personal history of nicotine dependence: Secondary | ICD-10-CM

## 2017-08-21 DIAGNOSIS — G8311 Monoplegia of lower limb affecting right dominant side: Secondary | ICD-10-CM | POA: Diagnosis present

## 2017-08-21 DIAGNOSIS — I639 Cerebral infarction, unspecified: Principal | ICD-10-CM | POA: Diagnosis present

## 2017-08-21 DIAGNOSIS — R29810 Facial weakness: Secondary | ICD-10-CM | POA: Diagnosis present

## 2017-08-21 DIAGNOSIS — Z8249 Family history of ischemic heart disease and other diseases of the circulatory system: Secondary | ICD-10-CM

## 2017-08-21 DIAGNOSIS — G9341 Metabolic encephalopathy: Secondary | ICD-10-CM | POA: Diagnosis present

## 2017-08-21 DIAGNOSIS — R29702 NIHSS score 2: Secondary | ICD-10-CM | POA: Diagnosis present

## 2017-08-21 DIAGNOSIS — E785 Hyperlipidemia, unspecified: Secondary | ICD-10-CM | POA: Diagnosis present

## 2017-08-21 DIAGNOSIS — Z823 Family history of stroke: Secondary | ICD-10-CM

## 2017-08-21 DIAGNOSIS — Z7982 Long term (current) use of aspirin: Secondary | ICD-10-CM

## 2017-08-21 LAB — COMPREHENSIVE METABOLIC PANEL
ALT: 23 U/L (ref 0–44)
ANION GAP: 11 (ref 5–15)
AST: 37 U/L (ref 15–41)
Albumin: 4.4 g/dL (ref 3.5–5.0)
Alkaline Phosphatase: 41 U/L (ref 38–126)
BILIRUBIN TOTAL: 0.9 mg/dL (ref 0.3–1.2)
BUN: 14 mg/dL (ref 8–23)
CO2: 26 mmol/L (ref 22–32)
CREATININE: 1.12 mg/dL (ref 0.61–1.24)
Calcium: 9.7 mg/dL (ref 8.9–10.3)
Chloride: 103 mmol/L (ref 98–111)
GFR calc non Af Amer: >60 mL/min (ref >60)
GLUCOSE: 121 mg/dL — AB (ref 70–99)
Potassium: 3.2 mmol/L — ABNORMAL LOW (ref 3.5–5.1)
SODIUM: 140 mmol/L (ref 135–145)
TOTAL PROTEIN: 7.9 g/dL (ref 6.5–8.1)

## 2017-08-21 LAB — PROTIME-INR
INR: 0.91
PROTHROMBIN TIME: 12.2 s (ref 11.4–15.2)

## 2017-08-21 LAB — GLUCOSE, CAPILLARY: GLUCOSE-CAPILLARY: 131 mg/dL — AB (ref 70–99)

## 2017-08-21 LAB — DIFFERENTIAL
Basophils Absolute: 0 10*3/uL (ref 0–0.1)
Basophils Relative: 0 %
EOS PCT: 1 %
Eosinophils Absolute: 0.1 10*3/uL (ref 0–0.7)
LYMPHS ABS: 1.2 10*3/uL (ref 1.0–3.6)
LYMPHS PCT: 13 %
Monocytes Absolute: 0.9 10*3/uL (ref 0.2–1.0)
Monocytes Relative: 10 %
NEUTROS ABS: 6.9 10*3/uL — AB (ref 1.4–6.5)
Neutrophils Relative %: 76 %

## 2017-08-21 LAB — CBC
HEMATOCRIT: 47.2 % (ref 40.0–52.0)
HEMOGLOBIN: 16.6 g/dL (ref 13.0–18.0)
MCH: 32.4 pg (ref 26.0–34.0)
MCHC: 35.2 g/dL (ref 32.0–36.0)
MCV: 92.2 fL (ref 80.0–100.0)
Platelets: 243 10*3/uL (ref 150–440)
RBC: 5.12 MIL/uL (ref 4.40–5.90)
RDW: 12.8 % (ref 11.5–14.5)
WBC: 9.1 10*3/uL (ref 3.8–10.6)

## 2017-08-21 LAB — TROPONIN I: Troponin I: <0.03 ng/mL (ref <0.03)

## 2017-08-21 LAB — APTT: aPTT: 29 seconds (ref 24–36)

## 2017-08-21 MED ORDER — POTASSIUM CHLORIDE CRYS ER 20 MEQ PO TBCR
20.0000 meq | EXTENDED_RELEASE_TABLET | Freq: Two times a day (BID) | ORAL | Status: DC
  Administered 2017-08-21 – 2017-08-25 (×9): 20 meq via ORAL
  Filled 2017-08-21 (×8): qty 1

## 2017-08-21 MED ORDER — ACETAMINOPHEN 325 MG PO TABS
650.0000 mg | ORAL_TABLET | Freq: Four times a day (QID) | ORAL | Status: DC | PRN
Start: 2017-08-21 — End: 2017-08-25
  Administered 2017-08-23 – 2017-08-24 (×2): 650 mg via ORAL
  Filled 2017-08-21 (×2): qty 2

## 2017-08-21 MED ORDER — PRAVASTATIN SODIUM 40 MG PO TABS
40.0000 mg | ORAL_TABLET | Freq: Every day | ORAL | Status: DC
  Filled 2017-08-21: qty 1

## 2017-08-21 MED ORDER — ASPIRIN EC 81 MG PO TBEC: 81.0000 mg | DELAYED_RELEASE_TABLET | Freq: Every day | ORAL | Status: DC

## 2017-08-21 MED ORDER — ONDANSETRON HCL 4 MG/2ML IJ SOLN: 4.0000 mg | Freq: Four times a day (QID) | INTRAMUSCULAR | Status: DC | PRN

## 2017-08-21 MED ORDER — ENOXAPARIN SODIUM 40 MG/0.4ML ~~LOC~~ SOLN
40.0000 mg | SUBCUTANEOUS | Status: DC
  Administered 2017-08-21 – 2017-08-24 (×4): 40 mg via SUBCUTANEOUS
  Filled 2017-08-21 (×4): qty 0.4

## 2017-08-21 MED ORDER — AMLODIPINE BESYLATE 5 MG PO TABS
5.0000 mg | ORAL_TABLET | Freq: Every day | ORAL | Status: DC
  Administered 2017-08-22 – 2017-08-25 (×4): 5 mg via ORAL
  Filled 2017-08-21 (×4): qty 1

## 2017-08-21 MED ORDER — HYDROCHLOROTHIAZIDE 25 MG PO TABS
25.0000 mg | ORAL_TABLET | Freq: Every day | ORAL | Status: DC
  Administered 2017-08-22 – 2017-08-25 (×4): 25 mg via ORAL
  Filled 2017-08-21 (×4): qty 1

## 2017-08-21 MED ORDER — ACETAMINOPHEN 650 MG RE SUPP
650.0000 mg | Freq: Four times a day (QID) | RECTAL | Status: DC | PRN
Start: 2017-08-21 — End: 2017-08-25

## 2017-08-21 MED ORDER — ONDANSETRON HCL 4 MG PO TABS: 4.0000 mg | ORAL_TABLET | Freq: Four times a day (QID) | ORAL | Status: DC | PRN

## 2017-08-21 NOTE — ED Triage Notes (Addendum)
Presents with right leg weakness since last pm  Per family he was dragging and had slurred speech earlier   Per family he was found on floor

## 2017-08-21 NOTE — ED Provider Notes (Signed)
[facility] Emergency Department Provider Note   ____________________________________________   First MD Initiated Contact with Patient 08/21/17 1106     (approximate)  I have reviewed the triage vital signs and the nursing notes.   HISTORY  Chief Complaint Weakness    HPI Bryor Rami is a 82 y.o. male who went to lunch with his family yesterday and began dropping his fork he got right-sided weakness arm and leg he went home after eating went to bed when he woke up this morning his right leg was weak he fell his right arm is still somewhat weak but better his speech was slurry earlier but it is better he has a little bit of right facial droop but that is also better than it was.  Because is been approximately 24 hours he is not a candidate for TPA.   Past Medical History:  Diagnosis Date  . Hyperlipidemia   . Hyperlipidemia   . Hypertension   . Parkinson disease [facility])     Patient Active Problem List   Diagnosis Date Noted  . 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 CAP (community acquired pneumonia) 01/06/2016  . Productive cough 11/09/2010  . Hypertension 11/04/2010  . Hyperlipidemia 11/04/2010   Past Surgical History:  Procedure Laterality Date  . CARDIOVASCULAR STRESS TEST     normal  . CARPAL TUNNEL RELEASE  2009   right hand  . X-STOP IMPLANTATION    . X-STOP IMPLANTATION     Dr. Gerrit Heck    Prior to Admission medications   Medication Sig Start Date End Date Taking? Authorizing Provider  amLODipine (NORVASC) 5 MG tablet Take 5 mg by mouth daily.    [provider]  aspirin 81 MG tablet Take 81 mg by mouth daily.      [provider]  hydrochlorothiazide (HYDRODIURIL) 25 MG tablet Take 25 mg by mouth daily.    [provider]  lovastatin (MEVACOR) 40 MG tablet Take 40 mg by mouth at bedtime.     [provider]  potassium chloride (KLOR-CON) 20 MEQ packet Take 40 mEq by mouth once.    [provider]    Allergies Patient has no known allergies.  Family History  Problem Relation Age of Onset  . Heart disease Father 80  . Cancer Sister        ? type  . Heart disease Brother   . Cancer Brother        ? type  . Stroke Child   . Seizures Child     Social History Social History   Tobacco Use  . Smoking status: Former Smoker    Packs/day: 1.50    Years: 50.00    Pack years: 75.00    Types: Cigarettes    Last attempt to quit: 11/04/2002    Years since quitting: 14.8  . Smokeless tobacco: Never Used  Substance Use Topics  . Alcohol use: Yes    Comment: once a week  . Drug use: No    Review of Systems  Constitutional: No fever/chills Eyes: No visual changes. ENT: No sore throat. Cardiovascular: Denies chest pain. Respiratory: Denies shortness of breath. Gastrointestinal: No abdominal pain.  No nausea, no vomiting.  No diarrhea.  No constipation. Genitourinary: Negative for dysuria. Musculoskeletal: Negative for back pain. Skin: Negative for rash. Neurological: Negative for headaches he does have new right-sided weakness  ____________________________________________   PHYSICAL EXAM:  VITAL SIGNS: ED Triage Vitals  Enc Vitals Group  BP 08/21/17 1004 (!) 154/94     Pulse Rate 08/21/17 1004 84     Resp 08/21/17 1004 20     Temp 08/21/17 1004 98.6 F (37 C)     Temp Source 08/21/17 1004 Oral     SpO2 08/21/17 1004 95 %     Weight 08/21/17 1002 150 lb (68 kg)     Height 08/21/17 1002 5\' 3"  (1.6 m)     Head Circumference --      Peak Flow --      Pain Score --      Pain Loc --      Pain Edu? --      Excl. in GC? --     Constitutional: Alert and oriented. Well appearing and in no acute distress. Eyes: Conjunctivae are normal. PERRL. EOMI. Head: Atraumatic.  Slight right-sided facial droop Nose: No congestion/rhinnorhea. Mouth/Throat: Mucous membranes are moist.  Oropharynx non-erythematous. Neck: No stridor.  Cardiovascular: Normal  rate, regular rhythm. Grossly normal heart sounds.  Good peripheral circulation. Respiratory: Normal respiratory effort.  No retractions. Lungs CTAB. Gastrointestinal: Soft and nontender. No distention. No abdominal bruits. No CVA tenderness. Musculoskeletal: No lower extremity tenderness nor edema.   Neurologic:  Normal speech and language.  Patient has slight right-sided facial droop otherwise cranial nerves II through XII are intact I cannot find any visual fields defect on confrontational testing cerebellar finger-nose rapid alternating movements are normal motor strength is 5/5 on the left and just barely perceptible weakness in the right arm left leg is normal right leg he cannot lift it off the bed easily although he does make it barely off the bed.  So 4 out of 5 strength there as well Skin:  Skin is warm, dry and intact. No rash noted. Psychiatric: Mood and affect are normal. Speech and behavior are normal.  ____________________________________________   LABS (all labs ordered are listed, but only abnormal results are displayed)  Labs Reviewed  DIFFERENTIAL - Abnormal; Notable for the following components:      Result Value   Neutro Abs 6.9 (*)    All other components within normal limits  GLUCOSE, CAPILLARY - Abnormal; Notable for the following components:   Glucose-Capillary 131 (*)    All other components within normal limits  PROTIME-INR  APTT  CBC  COMPREHENSIVE METABOLIC PANEL  TROPONIN I  CBG MONITORING, ED   ____________________________________________  EKG  EKG read interpreted by me shows normal sinus rhythm rate of 95 normal axis no acute changes ____________________________________________  RADIOLOGY  ED MD interpretation: CT the head shows some atrophy no acute changes per radiology  Official radiology report(s): Ct Head Wo Contrast  Result Date: 08/21/2017 CLINICAL DATA:  Right leg weakness since 3 a.m. today. Fell this morning. EXAM: CT HEAD WITHOUT  CONTRAST TECHNIQUE: Contiguous axial images were obtained from the base of the skull through the vertex without intravenous contrast. COMPARISON:  None. FINDINGS: Brain: Moderate diffuse cerebral atrophy and mild diffuse cerebellar atrophy. Mild patchy white matter low density in both cerebral hemispheres. No intracranial hemorrhage, mass lesion or CT evidence of acute infarction. Vascular: No hyperdense vessel or unexpected calcification. Skull: Normal. Negative for fracture or focal lesion. Sinuses/Orbits: Unremarkable. Other: None. IMPRESSION: 1. No acute abnormality. 2. Moderate atrophy and mild chronic small vessel white matter ischemic changes in both cerebral hemispheres. Electronically Signed   By: Beckie SaltsSteven  Reid M.D.   On: 08/21/2017 10:28    ____________________________________________   PROCEDURES  Procedure(s) performed:   Procedures  Critical Care performed:  ____________________________________________   INITIAL IMPRESSION / ASSESSMENT AND PLAN / ED COURSE  Clinically patient has had a stroke.  There is some slight improvement since yesterday.  We will plan on admitting him getting an MRI cardiac echo Doppler ultrasound of the carotids etc.         ____________________________________________   FINAL CLINICAL IMPRESSION(S) / ED DIAGNOSES  Final diagnoses:  Cerebrovascular accident (CVA), unspecified mechanism [facility])     ED Discharge Orders    None       Note:  This document was prepared using Dragon voice recognition software and may include unintentional dictation errors.    Arnaldo Natal, MD 08/21/17 1125

## 2017-08-21 NOTE — H&P (Signed)
[employer] [facility] - [place] [facility]   PATIENT NAME: Kevin Bradshaw    MR#:  [id number]  DATE OF BIRTH:  07/20/1935  DATE OF ADMISSION:  08/21/2017  PRIMARY CARE PHYSICIAN: Gracelyn Nurse, MD   REQUESTING/REFERRING PHYSICIAN: Dr. Dorothea Glassman  CHIEF COMPLAINT:   Chief Complaint  Patient presents with  . Weakness    HISTORY OF PRESENT ILLNESS:  Kevin Bradshaw  is a 82 y.o. male with a known history of hypertension, hyperlipidemia, history of Parkinson's disease who presents to the hospital due to right leg weakness.  Patient says he was in his usual state of health when this morning he developed worsening right lower extremity weakness.  Patient does have a history of Parkinson's but is currently not on any meds and does not have any intentional tremor or weakness secondary to his Parkinson's.  He does usually use a walker to ambulate but today his right leg was feeling increasingly weak and it gives way when he puts any weight on it.  He denies any headache, nausea, vomiting, chest pains or shortness of breath.  Patient denies ever having a previous stroke in the past.  He denies any right upper extremity or right facial numbness or weakness.  Patient presented to the ER underwent a CT of his head which was negative for acute pathology but continues to have significant right-sided weakness and therefore hospitalist services were contacted for admission.  PAST MEDICAL HISTORY:   Past Medical History:  Diagnosis Date  . Hyperlipidemia   . Hyperlipidemia   . Hypertension   . Parkinson disease (HCC)     PAST SURGICAL HISTORY:   Past Surgical History:  Procedure Laterality Date  . CARDIOVASCULAR STRESS TEST     normal  . CARPAL TUNNEL RELEASE  2009   right hand  . X-STOP IMPLANTATION    . X-STOP IMPLANTATION     Dr. Gerrit Heck    SOCIAL HISTORY:   Social History   Tobacco Use  . Smoking status: Former Smoker    Packs/day: 1.50    Years: 50.00    Pack years: 75.00     Types: Cigarettes    Last attempt to quit: 11/04/2002    Years since quitting: 14.8  . Smokeless tobacco: Never Used  Substance Use Topics  . Alcohol use: Yes    Comment: once a week    FAMILY HISTORY:   Family History  Problem Relation Age of Onset  . Heart disease Father 65  . Cancer Sister        ? type  . Heart disease Brother   . Cancer Brother        ? type  . Stroke Child   . Seizures Child     DRUG ALLERGIES:  No Known Allergies  REVIEW OF SYSTEMS:   Review of Systems  Constitutional: Negative for fever and weight loss.  HENT: Negative for congestion, nosebleeds and tinnitus.   Eyes: Negative for blurred vision, double vision and redness.  Respiratory: Negative for cough, hemoptysis and shortness of breath.   Cardiovascular: Negative for chest pain, orthopnea, leg swelling and PND.  Gastrointestinal: Negative for abdominal pain, diarrhea, melena, nausea and vomiting.  Genitourinary: Negative for dysuria, hematuria and urgency.  Musculoskeletal: Negative for falls and joint pain.  Neurological: Positive for weakness (Right lower extremity). Negative for dizziness, tingling, sensory change, focal weakness, seizures and headaches.  Endo/Heme/Allergies: Negative for polydipsia. Does not bruise/bleed easily.  Psychiatric/Behavioral: Negative for depression and memory loss. The  patient is not nervous/anxious.     MEDICATIONS AT HOME:   Prior to Admission medications   Medication Sig Start Date End Date Taking? Authorizing Provider  amLODipine (NORVASC) 5 MG tablet Take 5 mg by mouth daily.   Yes [provider]  aspirin 81 MG tablet Take 81 mg by mouth daily.     Yes [provider]  hydrochlorothiazide (HYDRODIURIL) 25 MG tablet Take 25 mg by mouth daily.   Yes [provider]  KLOR-CON M20 20 MEQ tablet Take 20 mEq by mouth 2 (two) times daily. 06/15/17  Yes [provider]  lovastatin (MEVACOR) 40 MG tablet Take 40 mg by  mouth at bedtime.    Yes [provider]      VITAL SIGNS:  Blood pressure (!) 154/94, pulse 84, temperature 98.6 F (37 C), temperature source Oral, resp. rate 20, height 5\' 3"  (1.6 m), weight 68 kg, SpO2 95 %.  PHYSICAL EXAMINATION:  Physical Exam  GENERAL:  82 y.o.-year-old patient lying in the bed with no acute distress.  EYES: Pupils equal, round, reactive to light and accommodation. No scleral icterus. Extraocular muscles intact.  HEENT: Head atraumatic, normocephalic. Oropharynx and nasopharynx clear. No oropharyngeal erythema, moist oral mucosa  NECK:  Supple, no jugular venous distention. No thyroid enlargement, no tenderness.  LUNGS: Normal breath sounds bilaterally, no wheezing, rales, rhonchi. No use of accessory muscles of respiration.  CARDIOVASCULAR: S1, S2 RRR. No murmurs, rubs, gallops, clicks.  ABDOMEN: Soft, nontender, nondistended. Bowel sounds present. No organomegaly or mass.  EXTREMITIES: No pedal edema, cyanosis, or clubbing. + 2 pedal & radial pulses b/l.   NEUROLOGIC: Cranial nerves II through XII are intact.  Right lower extremity weakness about 3 out of 5 strength.  No other focal motor or sensory deficits appreciated bilaterally PSYCHIATRIC: The patient is alert and oriented x 3.  SKIN: No obvious rash, lesion, or ulcer.   LABORATORY PANEL:   CBC Recent Labs  Lab 08/21/17 1042  WBC 9.1  HGB 16.6  HCT 47.2  PLT 243   ------------------------------------------------------------------------------------------------------------------  Chemistries  Recent Labs  Lab 08/21/17 1042  NA 140  K 3.2*  CL 103  CO2 26  GLUCOSE 121*  BUN 14  CREATININE 1.12  CALCIUM 9.7  AST 37  ALT 23  ALKPHOS 41  BILITOT 0.9   ------------------------------------------------------------------------------------------------------------------  Cardiac Enzymes Recent Labs  Lab 08/21/17 1042  TROPONINI <0.03    ------------------------------------------------------------------------------------------------------------------  RADIOLOGY:  Ct Head Wo Contrast  Result Date: 08/21/2017 CLINICAL DATA:  Right leg weakness since 3 a.m. today. Fell this morning. EXAM: CT HEAD WITHOUT CONTRAST TECHNIQUE: Contiguous axial images were obtained from the base of the skull through the vertex without intravenous contrast. COMPARISON:  None. FINDINGS: Brain: Moderate diffuse cerebral atrophy and mild diffuse cerebellar atrophy. Mild patchy white matter low density in both cerebral hemispheres. No intracranial hemorrhage, mass lesion or CT evidence of acute infarction. Vascular: No hyperdense vessel or unexpected calcification. Skull: Normal. Negative for fracture or focal lesion. Sinuses/Orbits: Unremarkable. Other: None. IMPRESSION: 1. No acute abnormality. 2. Moderate atrophy and mild chronic small vessel white matter ischemic changes in both cerebral hemispheres. Electronically Signed   By: Beckie Salts M.D.   On: 08/21/2017 10:28     IMPRESSION AND PLAN:   82 year old male with past medical history of hypertension, hyperlipidemia, previous history of Parkinson's who presents to the hospital due to right lower extremity weakness.  1.  CVA/TIA-this is the working diagnosis given patient's significant right  lower extremity weakness.  Patient CT head is negative for acute pathology. -We will admit the patient to the hospital, get MRI of the brain, carotid duplex, echocardiogram.  Next line-continue aspirin, Pravachol.  We will get a physical therapy consult, also get a neurology consult.  2.  Essential hypertension-continue Norvasc, hydrochlorothiazide.  3.  Hyperlipidemia-continue pravastatin    All the records are reviewed and case discussed with ED provider. Management plans discussed with the patient, family and they are in agreement.  CODE STATUS: Full code  TOTAL TIME TAKING CARE OF THIS PATIENT: 40  minutes.    Houston SirenSAINANI,Aviv Lengacher J M.D on 08/21/2017 at 12:46 PM  Between 7am to 6pm - Pager - [phone]  After 6pm go to [email or web address] - password [email or web address]  [facility]  Office  [phone]  CC: Primary care physician; Gracelyn NurseJohnston, John D, MD

## 2017-08-21 NOTE — Care Management Obs Status (Signed)
MEDICARE OBSERVATION STATUS NOTIFICATION   Patient Details  Name: Kevin Bradshaw MRN: [id number] Date of Birth: 02/27/1935   Medicare Observation Status Notification Given:  Yes    Rontrell Moquin A Kumar Falwell, RN 08/21/2017, 3:35 PM

## 2017-08-22 ENCOUNTER — Other Ambulatory Visit: Payer: Medicare Other

## 2017-08-22 DIAGNOSIS — R4781 Slurred speech: Secondary | ICD-10-CM | POA: Diagnosis present

## 2017-08-22 DIAGNOSIS — R29810 Facial weakness: Secondary | ICD-10-CM | POA: Diagnosis present

## 2017-08-22 DIAGNOSIS — I639 Cerebral infarction, unspecified: Principal | ICD-10-CM

## 2017-08-22 DIAGNOSIS — Z823 Family history of stroke: Secondary | ICD-10-CM | POA: Diagnosis not present

## 2017-08-22 DIAGNOSIS — E876 Hypokalemia: Secondary | ICD-10-CM | POA: Diagnosis present

## 2017-08-22 DIAGNOSIS — Z8249 Family history of ischemic heart disease and other diseases of the circulatory system: Secondary | ICD-10-CM | POA: Diagnosis not present

## 2017-08-22 DIAGNOSIS — Z7982 Long term (current) use of aspirin: Secondary | ICD-10-CM | POA: Diagnosis not present

## 2017-08-22 DIAGNOSIS — E785 Hyperlipidemia, unspecified: Secondary | ICD-10-CM | POA: Diagnosis present

## 2017-08-22 DIAGNOSIS — G9341 Metabolic encephalopathy: Secondary | ICD-10-CM | POA: Diagnosis present

## 2017-08-22 DIAGNOSIS — I10 Essential (primary) hypertension: Secondary | ICD-10-CM | POA: Diagnosis present

## 2017-08-22 DIAGNOSIS — Z87891 Personal history of nicotine dependence: Secondary | ICD-10-CM | POA: Diagnosis not present

## 2017-08-22 DIAGNOSIS — G20 Parkinson's disease: Secondary | ICD-10-CM | POA: Diagnosis present

## 2017-08-22 DIAGNOSIS — R29702 NIHSS score 2: Secondary | ICD-10-CM | POA: Diagnosis present

## 2017-08-22 DIAGNOSIS — G8311 Monoplegia of lower limb affecting right dominant side: Secondary | ICD-10-CM | POA: Diagnosis present

## 2017-08-22 LAB — LIPID PANEL
Cholesterol: 180 mg/dL (ref 0–200)
HDL: 45 mg/dL (ref >40)
LDL Cholesterol: 100 mg/dL — ABNORMAL HIGH (ref 0–99)
Total CHOL/HDL Ratio: 4 RATIO
Triglycerides: 174 mg/dL — ABNORMAL HIGH (ref <150)
VLDL: 35 mg/dL (ref 0–40)

## 2017-08-22 LAB — BASIC METABOLIC PANEL
ANION GAP: 10 (ref 5–15)
BUN: 13 mg/dL (ref 8–23)
CO2: 28 mmol/L (ref 22–32)
Calcium: 9.1 mg/dL (ref 8.9–10.3)
Chloride: 99 mmol/L (ref 98–111)
Creatinine, Ser: 0.91 mg/dL (ref 0.61–1.24)
GFR calc Af Amer: >60 mL/min (ref >60)
Glucose, Bld: 120 mg/dL — ABNORMAL HIGH (ref 70–99)
POTASSIUM: 3.1 mmol/L — AB (ref 3.5–5.1)
SODIUM: 137 mmol/L (ref 135–145)

## 2017-08-22 LAB — MAGNESIUM: MAGNESIUM: 1.8 mg/dL (ref 1.7–2.4)

## 2017-08-22 MED ORDER — ATORVASTATIN CALCIUM 20 MG PO TABS
80.0000 mg | ORAL_TABLET | Freq: Every day | ORAL | Status: DC
  Administered 2017-08-22 – 2017-08-24 (×3): 80 mg via ORAL
  Filled 2017-08-22 (×3): qty 4

## 2017-08-22 MED ORDER — POTASSIUM CHLORIDE CRYS ER 20 MEQ PO TBCR
40.0000 meq | EXTENDED_RELEASE_TABLET | Freq: Once | ORAL | Status: AC
  Administered 2017-08-22: 10:00:00 40 meq via ORAL
  Filled 2017-08-22: qty 2

## 2017-08-22 MED ORDER — ASPIRIN 325 MG PO TABS
325.0000 mg | ORAL_TABLET | Freq: Every day | ORAL | Status: DC
  Filled 2017-08-22: qty 1

## 2017-08-22 MED ORDER — MAGNESIUM SULFATE 2 GM/50ML IV SOLN
2.0000 g | Freq: Once | INTRAVENOUS | Status: AC
  Administered 2017-08-22: 11:00:00 2 g via INTRAVENOUS
  Filled 2017-08-22: qty 50

## 2017-08-22 MED ORDER — ATORVASTATIN CALCIUM 20 MG PO TABS: 40.0000 mg | ORAL_TABLET | Freq: Every day | ORAL | Status: DC

## 2017-08-22 MED ORDER — ASPIRIN 81 MG PO CHEW
81.0000 mg | CHEWABLE_TABLET | Freq: Every day | ORAL | Status: DC
  Administered 2017-08-22 – 2017-08-25 (×4): 81 mg via ORAL
  Filled 2017-08-22 (×4): qty 1

## 2017-08-22 MED ORDER — CLOPIDOGREL BISULFATE 75 MG PO TABS
75.0000 mg | ORAL_TABLET | Freq: Every day | ORAL | Status: DC
  Administered 2017-08-22 – 2017-08-25 (×4): 75 mg via ORAL
  Filled 2017-08-22 (×4): qty 1

## 2017-08-22 NOTE — Evaluation (Signed)
Physical Therapy Evaluation Patient Details Name: Kevin Bradshaw MRN: [id number] DOB: Apr 25, 1935 Today's Date: 08/22/2017   History of Present Illness  Pt is a 82 y.o. 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 PMH includes hypertension, hyperlipidemia, history of Parkinson's disease.  Clinical Impression  Pt is a pleasant 82 year old male who was admitted for R leg weakness with above PMH. During hospital stay pt had MRI preformed that found infarct with details above. PT noted slurred speech throughout session, but pt was able to answer complex questions and follow multi step commands. Pt preforms transfers with Min A, and ambulates 20' with RW/Min A/chair follow for safety see below for details. PT deferred bed mobility 2/2 pt found in recliner and preference to return to recliner. Pt demonstrates deficits with functional mobility 2/2 weakness, poor coordination, poor sequencing, and diminished balance. Pt Would benefit from skilled PT to address above deficits and promote optimal return to PLOF. PT recommends d/c to SNF.     Follow Up Recommendations SNF    Equipment Recommendations  None recommended by PT    Recommendations for Other Services       Precautions / Restrictions Precautions Precautions: Fall Restrictions Weight Bearing Restrictions: No      Mobility  Bed Mobility Overal bed mobility: (deferred 2/2 pt found in recliner and prefrence to return )                Transfers Overall transfer level: Needs assistance Equipment used: Rolling walker (2 wheeled) Transfers: Sit to/from Stand Sit to Stand: Min assist         General transfer comment: Pt required Min A at gait belt for  sit<>stand for unsteadiness and weakness. Pt benefited from verbal cueing for proper use of UEs and sequencing.   Ambulation/Gait Ambulation/Gait assistance: Min assist Gait Distance (Feet): 20 Feet Assistive device: Rolling walker (2 wheeled) Gait Pattern/deviations: Step-to pattern     General Gait Details: Pt ambulated 20' with RW/Min A/chair follow 2/2 weakness, poor sequencing, and diminished balance. Pt benefited from consistent verbal cueing for proper use of RW. PT noted increrased hip flexion B and, two bouts of knee buckling, and steop to pattern during gait.  Stairs            Wheelchair Mobility    Modified Rankin (Stroke Patients Only)       Balance Overall balance assessment: Needs assistance Sitting-balance support: No upper extremity supported;Feet supported Sitting balance-Leahy Scale: Good Sitting balance - Comments: pt had difficulty with going from leaned back in recliner to sitting upright. Pt utilized UEs    Standing balance support: Bilateral upper extremity supported Standing balance-Leahy Scale: Poor Standing balance comment: Pt had right side lean with standing that did imrove mildy with mulimodal cueing.                             Pertinent Vitals/Pain Pain Assessment: No/denies pain    Home Living Family/patient expects to be discharged to:: Private residence Living Arrangements: Children(daughter and son in Social worker) Available Help at Discharge: Family Type of Home: House Home Access: Stairs to enter Entrance Stairs-Rails: None Entrance Stairs-Number of Steps: 2 Home Layout: One level Home Equipment: Environmental consultant -  standard;Shower seat;Grab bars - tub/shower Additional Comments: son in law is retired and can help during day and daughter is home in evenings    Prior Function Level of Independence: Needs assistance   Gait / Transfers Assistance Needed: Mod I with standard walker  ADL's / Homemaking Assistance Needed: Pt needed  assistance with meal prep  Comments: Pt had been driving for the last month, but not before that secondary to PD.     Hand Dominance   Dominant Hand: Right    Extremity/Trunk Assessment   Upper Extremity Assessment Upper Extremity Assessment: Generalized weakness;RUE deficits/detail(Grossly 3/5 MMT R UE) RUE Deficits / Details: Coordination was diminshed R compared to L. Discriminative touch sensation was roughly R=L, but noted mild deminished sensation B. 2-point discrimination was WNL. Possible some deficits from chronic PD.    Lower Extremity Assessment Lower Extremity Assessment: Generalized weakness;RLE deficits/detail RLE Deficits / Details: Coordination was diminshed R compared to L. Discriminative touch sensation was roughly R=L, but noted mild deminished sensation B. 2-point discrimination was WNL. Possible some deficits from chronic PD.       Communication   Communication: Expressive difficulties(slurred speech)  Cognition Arousal/Alertness: Awake/alert Behavior During Therapy: WFL for tasks assessed/performed Overall Cognitive Status: Impaired/Different from baseline Area of Impairment: Orientation                 Orientation Level: Disoriented to;Time             General Comments: Pt able to answer complex questions and follow multi step commands.      General Comments      Exercises     Assessment/Plan    PT Assessment Patient needs continued PT services  PT Problem List Decreased strength;Decreased range of motion;Decreased activity tolerance;Decreased balance;Decreased mobility;Decreased coordination;Decreased knowledge of use of DME;Decreased safety awareness;Impaired sensation       PT Treatment Interventions DME instruction;Gait training;Stair training;Functional mobility training;Therapeutic activities;Therapeutic exercise;Balance training;Neuromuscular re-education;Patient/family education    PT Goals (Current goals can be found in the  Care Plan section)  Acute Rehab PT Goals Patient Stated Goal: return to PLOF PT Goal Formulation: With patient Time For Goal Achievement: 09/05/17 Potential to Achieve Goals: Good    Frequency 7X/week   Barriers to discharge        Co-evaluation               AM-PAC PT "6 Clicks" Daily Activity  Outcome Measure Difficulty turning over in bed (including adjusting bedclothes, sheets and blankets)?: A Lot Difficulty moving from lying on back to sitting on the side of the bed? : A Lot Difficulty sitting down on and standing up from a chair with arms (e.g., wheelchair, bedside commode, etc,.)?: Unable Help needed moving to and from a bed to chair (including a wheelchair)?: A Little Help needed walking in hospital room?: A Little Help needed climbing 3-5 steps with a railing? : A Lot 6 Click Score: 13    End of Session Equipment Utilized During Treatment: Gait belt Activity Tolerance: Patient tolerated treatment well;Patient limited by fatigue Patient left: in chair;with call bell/phone within reach;with chair alarm set Nurse Communication: Mobility status PT Visit Diagnosis: Unsteadiness on feet (R26.81);Other abnormalities of gait and mobility (R26.89);Muscle weakness (generalized) (M62.81);Difficulty in walking, not elsewhere classified (R26.2);Hemiplegia and hemiparesis Hemiplegia - Right/Left: Right Hemiplegia - dominant/non-dominant: Dominant Hemiplegia - caused by: Cerebral infarction    Time: 0981-1914: 0845-0923 PT Time Calculation (min) (ACUTE ONLY): 38 min   Charges:              .  srg  Renaldo HarrisonStephen Jaydis Duchene 08/22/2017, 10:51 AM

## 2017-08-22 NOTE — Clinical Social Work Note (Signed)
Clinical Social Work Assessment  Patient Details  Name: Kevin Bradshaw MRN: [id number] Date of Birth: [birth year]  Date of referral:  08/22/17               Reason for consult:  Facility Placement                Permission sought to share information with:  Case Manager, Customer service manager, Family Supports Permission granted to share information::  Yes, Verbal Permission Granted  Name::      SNF  Agency::   [facility]   Relationship::     Contact Information:     Housing/Transportation Living arrangements for the past 2 months:  Single Family Home Source of Information:  Patient Patient Interpreter Needed:  None Criminal Activity/Legal Involvement Pertinent to Current Situation/Hospitalization:  No - Comment as needed Significant Relationships:  Adult Children, Other Family Members Lives with:  Adult Children Do you feel safe going back to the place where you live?  Yes Need for family participation in patient care:  Yes (Comment)  Care giving concerns:  Patient lives with his daughter and son in law    Social Worker assessment / plan:  CSW consulted for SNF placement. CSW met with patient and daughter Kevin Bradshaw in room. Patient is alert and oriented x4. CSW introduced self and explained role. Patient states that he lives with his daughter and son in law. CSW explained PT recommendation of SNF. Patient is agreeable and would like to go to SNF for rehab. Daughter is also agreeable to SNF and states that she works during the day and does not have anyone to help him during the day. CSW explained Medicare guidelines and 3 night rule. CSW also explained bed search process. Daughter states that patient has been to [facility] in the past and that would be their preference if possible. CSW will begin bed search and give offers once received.   Employment status:  Retired Forensic scientist:  [employer] PT Recommendations:  [place] / Referral to  community resources:  [place]  Patient/Family's Response to care:  Patient and family thanked CSW for assistance   Patient/Family's Understanding of and Emotional Response to Diagnosis, Current Treatment, and Prognosis:  Patient and family are in agreement with SNF placement.   Emotional Assessment Appearance:  Appears stated age Attitude/Demeanor/Rapport:    Affect (typically observed):  Accepting, Pleasant, Hopeful Orientation:  Oriented to Self, Oriented to Place, Oriented to  Time, Oriented to Situation Alcohol / Substance use:  Not Applicable Psych involvement (Current and /or in the community):  No (Comment)  Discharge Needs  Concerns to be addressed:  Discharge Planning Concerns Readmission within the last 30 days:  No Current discharge risk:  None Barriers to Discharge:  Continued Medical Work up   [employer], [place] 08/22/2017, 12:05 PM

## 2017-08-22 NOTE — Progress Notes (Addendum)
Advanced Care Plan.  Purpose of Encounter: CODE STATUS. Parties in Attendance: The patient, his daughter, son-in-law and me. Patient's Decisional Capacity: Yes. Medical Story: 82 year old male with past medical history of hypertension, hyperlipidemia, previous history of Parkinson's who presents to the hospital due to right lower extremity weakness.  MRI of the brain show acute CVA.  I discussed with the patient about his current condition, prognosis and CODE STATUS.  The patient does want to be resuscitated or intubated. Plan:  Code Status: FULL CODE. Time spent discussing advance care planning: 17 minutes.

## 2017-08-22 NOTE — Progress Notes (Signed)
Pharmacy Electrolyte Monitoring  Pharmacy has been consulted for management of electrolytes in this 82 year old male admitted for right leg weakness and acute CVA  Labs: 8/12 K 3.1         Mg 1.8  Assessment/Plan: Patient continued on PTA potassium 20 mEq PO BID. Will also give 40 mEq PO x 1 dose for a total daily dose of 80 mEq potassium. Will give magnesium 2 g IV x 1 dose.  Will order electrolytes with morning labs.  Pharmacy will continue to monitor.  Pricilla RiffleAbby K Ellington, PharmD Pharmacy Resident  08/22/2017 10:43 AM

## 2017-08-22 NOTE — Consult Note (Signed)
Referring Physician: Imogene Burnhen    Chief Complaint: right side weakness, right facial droop and slurred speech  HPI: Kevin Bradshaw is an 82 y.o. male here for evaluation of stroke like symptoms. He presented to the ED on 08/21/2017 with right side weakness, right facial droop and slurred speech. He report that he was out eating with his daughter when he suddenly had difficulty holding his fork with his right hand. Patient state that he kept dropping his fork which was unusual for him. Daughter noticed slurring of speech and right facial droop. He could not lift his right leg and when he tried to stand, his right leg felt weak and gave out on him causing him to fall. He had stroke work up including CT head which was initially negative, a follow up MRI brain showed nonhemorrhagic infarct in the left internal capsule. [place] carotids did not show significant stenosis. He was deemed not a candidate for tPA due to being out of the window period. Patient state right side weakness and slurred speech is improved from previous. He still has some difficulty walking due to right side weakness and requires assistance.   Date last known well: Date: 08/20/2017 Time last known well: Unable to determine tPA Given: No: Patient was outside of time window  Past Medical History:  Diagnosis Date  . Hyperlipidemia   . Hyperlipidemia   . Hypertension   . Parkinson disease [facility](HCC)     Past Surgical History:  Procedure Laterality Date  . CARDIOVASCULAR STRESS TEST     normal  . CARPAL TUNNEL RELEASE  2009   right hand  . X-STOP IMPLANTATION    . X-STOP IMPLANTATION     Dr. Gerrit Heckaliff    Family History  Problem Relation Age of Onset  . Heart disease Father [id number]  . Cancer Sister        ? type  . Heart disease Brother   . Cancer Brother        ? type  . Stroke Child   . Seizures Child    Social History:  reports that he quit smoking about 14 years ago. His smoking use included cigarettes. He has a 75.00 pack-year smoking  history. He has never used smokeless tobacco. He reports that he drinks alcohol. He reports that he does not use drugs.  Allergies: No Known Allergies  Medications:  I have reviewed the patient's current medications. Prior to Admission:  Medications Prior to Admission  Medication Sig Dispense Refill Last Dose  . amLODipine (NORVASC) 5 MG tablet Take 5 mg by mouth daily.   08/21/2017 at Unknown time  . aspirin 81 MG tablet Take 81 mg by mouth daily.     08/21/2017 at Unknown time  . hydrochlorothiazide (HYDRODIURIL) 25 MG tablet Take 25 mg by mouth daily.   08/21/2017 at Unknown time  . KLOR-CON M20 20 MEQ tablet Take 20 mEq by mouth 2 (two) times daily.  7 08/21/2017 at Unknown time  . lovastatin (MEVACOR) 40 MG tablet Take 40 mg by mouth at bedtime.    08/20/2017 at Unknown time   Scheduled: . amLODipine  5 mg Oral Daily  . aspirin  81 mg Oral Daily  . atorvastatin  80 mg Oral q1800  . clopidogrel  75 mg Oral Daily  . enoxaparin (LOVENOX) injection  40 mg Subcutaneous Q24H  . hydrochlorothiazide  25 mg Oral Daily  . potassium chloride SA  20 mEq Oral BID    ROS: History obtained from the patient  General ROS: negative for - chills, fatigue, fever, night sweats, weight gain or weight loss Psychological ROS: negative for - behavioral disorder, hallucinations, memory difficulties, mood swings or suicidal ideation Ophthalmic ROS: negative for - blurry vision, double vision, eye pain or loss of vision ENT ROS: negative for - epistaxis, nasal discharge, oral lesions, sore throat, tinnitus or vertigo Allergy and Immunology ROS: negative for - hives or itchy/watery eyes Hematological and Lymphatic ROS: negative for - bleeding problems, bruising or swollen lymph nodes Endocrine ROS: negative for - galactorrhea, hair pattern changes, polydipsia/polyuria or temperature intolerance Respiratory ROS: negative for - cough, hemoptysis, shortness of breath or wheezing Cardiovascular ROS: negative for  - chest pain, dyspnea on exertion, edema or irregular heartbeat Gastrointestinal ROS: negative for - abdominal pain, diarrhea, hematemesis, nausea/vomiting or stool incontinence Genito-Urinary ROS: negative for - dysuria, hematuria, incontinence or urinary frequency/urgency Musculoskeletal ROS: negative for - joint swelling or muscular weakness Neurological ROS: as noted in HPI Dermatological ROS: negative for rash and skin lesion changes  Physical Examination: Blood pressure 114/77, pulse 77, temperature 97.7 F (36.5 C), temperature source Oral, resp. rate 15, height 5\' 1"  (1.549 m), weight 68.9 kg, SpO2 96 %.  HEENT-  Normocephalic, no lesions, without obvious abnormality.  Normal external eye and conjunctiva.  Normal TM's bilaterally.  Normal auditory canals and external ears. Normal external nose, mucus membranes and septum.  Normal pharynx. Cardiovascular- regular rate and rhythm, S1, S2 normal, no murmur, click, rub or gallop, pulses palpable throughout   Lungs- chest clear, no wheezing, rales, normal symmetric air entry, Heart exam - S1, S2 normal, no murmur, no gallop, rate regular Abdomen- soft, non-tender; bowel sounds normal; no masses,  no organomegaly Extremities- less then 2 second capillary refill, no edema, no skin discoloration, no clubbing and no cyanosis Lymph-no adenopathy palpable and Not assesed Musculoskeletal-no joint tenderness, deformity or swelling Skin-warm and dry, no hyperpigmentation, vitiligo, or suspicious lesions  Neurological Examination    Mental Status: Alert, oriented, thought content appropriate. Mild dysarthric speech.  Able to follow 3 step commands without difficulty. Cranial Nerves: II: Discs flat bilaterally; Visual fields grossly normal, pupils equal, round, reactive to light and accommodation III,IV, VI: ptosis not present, extra-ocular motions intact bilaterally V,VII: smile asymmetric, slight right facial droop. Facial light touch sensation  normal bilaterally VIII: hearing normal bilaterally IX,X: gag reflex present XI: bilateral shoulder shrug XII: midline tongue extension Motor: Right : Upper extremity   4/5    Left:     Upper extremity   5/5  Lower extremity   4-/5     Lower extremity   5/5 Tone and bulk:normal tone throughout; no atrophy noted Sensory: Pinprick and light touch intact throughout, bilaterally Deep Tendon Reflexes: 2+ and symmetric throughout Plantars: Right: downgoing   Left: downgoing Cerebellar: normal finger-to-nose, normal rapid alternating movements and normal heel-to-shin test Gait: Abnormal gait and station, difficulty standing from sitting position, right hemiparetic gait    Laboratory Studies:  Basic Metabolic Panel: Recent Labs  Lab 08/21/17 1042 08/22/17 0723  NA 140 137  K 3.2* 3.1*  CL 103 99  CO2 26 28  GLUCOSE 121* 120*  BUN 14 13  CREATININE 1.12 0.91  CALCIUM 9.7 9.1  MG  --  1.8    Liver Function Tests: Recent Labs  Lab 08/21/17 1042  AST 37  ALT 23  ALKPHOS 41  BILITOT 0.9  PROT 7.9  ALBUMIN 4.4   No results for input(s): LIPASE, AMYLASE in the last 168 hours.  No results for input(s): AMMONIA in the last 168 hours.  CBC: Recent Labs  Lab 08/21/17 1042  WBC 9.1  NEUTROABS 6.9*  HGB 16.6  HCT 47.2  MCV 92.2  PLT 243    Cardiac Enzymes: Recent Labs  Lab 08/21/17 1042  TROPONINI <0.03    BNP: Invalid input(s): POCBNP  CBG: Recent Labs  Lab 08/21/17 1051  GLUCAP 131*    Microbiology: Results for orders placed or performed during the hospital encounter of 01/06/16  Culture, blood (Routine X 2) w Reflex to ID Panel     Status: None   Collection Time: 01/06/16  2:36 PM  Result Value Ref Range Status   Specimen Description BLOOD  R AC  Final   Special Requests   Final    BOTTLES DRAWN AEROBIC AND ANAEROBIC  ANA 2 AER 4 ML   Culture NO GROWTH 5 DAYS  Final   Report Status 01/11/2016 FINAL  Final  Culture, blood (Routine X 2) w Reflex to  ID Panel     Status: None   Collection Time: 01/06/16  2:36 PM  Result Value Ref Range Status   Specimen Description BLOOD L AC  Final   Special Requests BOTTLES DRAWN AEROBIC AND ANAEROBIC  ANA 17 AER 16  Final   Culture NO GROWTH 5 DAYS  Final   Report Status 01/11/2016 FINAL  Final    Coagulation Studies: Recent Labs    08/21/17 1042  LABPROT 12.2  INR 0.91    Urinalysis: No results for input(s): COLORURINE, LABSPEC, PHURINE, GLUCOSEU, HGBUR, BILIRUBINUR, KETONESUR, PROTEINUR, UROBILINOGEN, NITRITE, LEUKOCYTESUR in the last 168 hours.  Invalid input(s): APPERANCEUR  Lipid Panel:    Component Value Date/Time   CHOL 180 08/22/2017 0723   TRIG 174 (H) 08/22/2017 0723   HDL 45 08/22/2017 0723   CHOLHDL 4.0 08/22/2017 0723   VLDL 35 08/22/2017 0723   LDLCALC 100 (H) 08/22/2017 0723    HgbA1C: No results found for: HGBA1C  Urine Drug Screen:  No results found for: LABOPIA, COCAINSCRNUR, LABBENZ, AMPHETMU, THCU, LABBARB  Alcohol Level: No results for input(s): ETH in the last 168 hours.  Other results: EKG: normal sinus rhythm at 95 bpm.  Imaging: Ct Head Wo Contrast  Result Date: 08/21/2017 CLINICAL DATA:  Right leg weakness since 3 a.m. today. Fell this morning. EXAM: CT HEAD WITHOUT CONTRAST TECHNIQUE: Contiguous axial images were obtained from the base of the skull through the vertex without intravenous contrast. COMPARISON:  None. FINDINGS: Brain: Moderate diffuse cerebral atrophy and mild diffuse cerebellar atrophy. Mild patchy white matter low density in both cerebral hemispheres. No intracranial hemorrhage, mass lesion or CT evidence of acute infarction. Vascular: No hyperdense vessel or unexpected calcification. Skull: Normal. Negative for fracture or focal lesion. Sinuses/Orbits: Unremarkable. Other: None. IMPRESSION: 1. No acute abnormality. 2. Moderate atrophy and mild chronic small vessel white matter ischemic changes in both cerebral hemispheres. Electronically  Signed   By: Beckie Salts M.D.   On: 08/21/2017 10:28   Mr Brain Wo Contrast  Result Date: 08/21/2017 CLINICAL DATA:  Neuro deficits, subacute. Right-sided weakness beginning over 24 hours ago. EXAM: MRI HEAD WITHOUT CONTRAST TECHNIQUE: Multiplanar, multiecho pulse sequences of the brain and surrounding structures were obtained without intravenous contrast. COMPARISON:  CT head without contrast 08/21/2017. FINDINGS: Brain: A punctate nonhemorrhagic infarct is present in the posterior limb of the left internal capsule and lentiform nucleus. No acute hemorrhage or mass lesion is present. Associated T2 signal changes are present. No other  acute infarcts are present. Moderate generalized atrophy is present. There is mild periventricular white matter changes bilaterally. Dilated perivascular spaces are present within the basal ganglia. Internal auditory canals are within normal limits bilaterally. The brainstem and cerebellum are normal. Vascular: Flow is present in the major intracranial arteries. Skull and upper cervical spine: Skull base is within normal limits. Craniocervical junction is normal. The upper cervical spine demonstrates multilevel degenerative changes in at C3-4, C4-5, and C5-6. Sinuses/Orbits: Paranasal sinuses and mastoid air cells are clear. Globes and orbits are within normal limits bilaterally. IMPRESSION: 1. Punctate nonhemorrhagic infarct in the posterior limb of the left internal capsule corresponds with right-sided weakness. This likely impacts the cortical spinal tracts. 2. Advanced atrophy and mild white matter changes bilaterally likely reflect the sequela of chronic microvascular ischemia. 3. Multilevel degenerative changes in the cervical spine. Electronically Signed   By: Marin Robertshristopher  Mattern M.D.   On: 08/21/2017 17:00   [place] Carotid Bilateral  Result Date: 08/21/2017 CLINICAL DATA:  Right leg weakness. Slurred speech. Small infarct seen on today's MRI. EXAM: BILATERAL CAROTID  DUPLEX ULTRASOUND TECHNIQUE: Wallace CullensGray scale imaging, color Doppler and duplex ultrasound were performed of bilateral carotid and vertebral arteries in the neck. COMPARISON:  None. FINDINGS: Criteria: Quantification of carotid stenosis is based on velocity parameters that correlate the residual internal carotid diameter with NASCET-based stenosis levels, using the diameter of the distal internal carotid lumen as the denominator for stenosis measurement. The following velocity measurements were obtained: RIGHT ICA: 74/24 cm/sec CCA: 55/12 cm/sec SYSTOLIC ICA/CCA RATIO:  1.4 ECA:  48 cm/sec LEFT ICA: 88/30 cm/sec CCA: 86/17 cm/sec SYSTOLIC ICA/CCA RATIO:  1.0 ECA:  125 cm/sec RIGHT CAROTID ARTERY: Mild atherosclerotic change in the common internal carotid artery. No significant stenosis visualized. RIGHT VERTEBRAL ARTERY:  Antegrade flow. LEFT CAROTID ARTERY: Mild plaque seen in the CCA and ICA. No significant stenosis visualized. LEFT VERTEBRAL ARTERY:  Antegrade flow IMPRESSION: 1. No significant stenosis seen in either the right or left ICA. Electronically Signed   By: Gerome Samavid  Williams III M.D   On: 08/21/2017 17:41    Patient seen and examined.  Clinical course and management discussed.  Necessary edits performed.  I agree with the above.  Assessment and plan of care developed and discussed below.    Assessment: 82 y.o. male with acute  nonhemorrhagic infarct in the posterior limb of the left internal capsule causing right sided weakness, slurred speech and right facial droop likely due to small vessel disease. Reviewed carotid ultrasound which was unremarkable without significant stenosis. Echocardiogram is pending.  LDL 100, he is currently on Lovastatin 40 mg once a day.    Stroke Risk Factors - hyperlipidemia and hypertension  Plan: 1. PT consult, OT consult, Speech consult 2. Echocardiogram pending.   3. Carotid dopplers unremarkable with no significant stenosis 4. Start aggressive medical management  with dual therapy Aspirin 81 mg/day and Plavix 75 mg /day with intensive management of vascular risk factor to keep systolic BP (SBP) <140 mm Hg (161130 mm Hg if diabetic) and low density lipoprotein (LDL) <70 mg/dl, and lifestyle modification. 5. NPO until RN stroke swallow screen 6. Telemetry monitoring 7. Frequent neuro checks   Thana FarrLeslie Daiwik Buffalo, MD Neurology [phone]  08/22/2017  9:40 AM

## 2017-08-22 NOTE — Progress Notes (Addendum)
[facility] - [place] [facility]   PATIENT NAME: Kevin NordmannHenry Bradshaw    MR#:  [id number]  DATE OF BIRTH:  03/10/1935  SUBJECTIVE:  CHIEF COMPLAINT:   Chief Complaint  Patient presents with  . Weakness   Right leg weakness. REVIEW OF SYSTEMS:  Review of Systems  Constitutional: Negative for chills, fever and malaise/fatigue.  HENT: Negative for sore throat.   Eyes: Negative for blurred vision and double vision.  Respiratory: Negative for cough, hemoptysis, shortness of breath, wheezing and stridor.   Cardiovascular: Negative for chest pain, palpitations, orthopnea and leg swelling.  Gastrointestinal: Negative for abdominal pain, blood in stool, diarrhea, melena, nausea and vomiting.  Genitourinary: Negative for dysuria, flank pain and hematuria.  Musculoskeletal: Negative for back pain and joint pain.  Skin: Negative for rash.  Neurological: Positive for focal weakness and weakness. Negative for dizziness, sensory change, seizures, loss of consciousness and headaches.  Endo/Heme/Allergies: Negative for polydipsia.  Psychiatric/Behavioral: Negative for depression. The patient is not nervous/anxious.     DRUG ALLERGIES:  No Known Allergies VITALS:  Blood pressure 133/83, pulse 85, temperature 97.9 F (36.6 C), temperature source Oral, resp. rate 18, height 5\' 1"  (1.549 m), weight 68.9 kg, SpO2 95 %. PHYSICAL EXAMINATION:  Physical Exam  Constitutional: He is oriented to person, place, and time. He appears well-developed.  HENT:  Head: Normocephalic.  Mouth/Throat: Oropharynx is clear and moist.  Eyes: Pupils are equal, round, and reactive to light. Conjunctivae and EOM are normal. No scleral icterus.  Neck: Normal range of motion. Neck supple. No JVD present. No tracheal deviation present.  Cardiovascular: Normal rate, regular rhythm and normal heart sounds. Exam reveals no gallop.  No murmur heard. Pulmonary/Chest: Effort normal and breath sounds normal. No  respiratory distress. He has no wheezes. He has no rales.  Abdominal: Soft. Bowel sounds are normal. He exhibits no distension. There is no tenderness. There is no rebound.  Musculoskeletal: Normal range of motion. He exhibits no edema or tenderness.  Neurological: He is alert and oriented to person, place, and time. No cranial nerve deficit.  Right leg weakness 4/5.  Skin: No rash noted. No erythema.   LABORATORY PANEL:  Male CBC Recent Labs  Lab 08/21/17 1042  WBC 9.1  HGB 16.6  HCT 47.2  PLT 243   ------------------------------------------------------------------------------------------------------------------ Chemistries  Recent Labs  Lab 08/21/17 1042 08/22/17 0723  NA 140 137  K 3.2* 3.1*  CL 103 99  CO2 26 28  GLUCOSE 121* 120*  BUN 14 13  CREATININE 1.12 0.91  CALCIUM 9.7 9.1  MG  --  1.8  AST 37  --   ALT 23  --   ALKPHOS 41  --   BILITOT 0.9  --    RADIOLOGY:  Mr Brain Wo Contrast  Result Date: 08/21/2017 CLINICAL DATA:  Neuro deficits, subacute. Right-sided weakness beginning over 24 hours ago. EXAM: MRI HEAD WITHOUT CONTRAST TECHNIQUE: Multiplanar, multiecho pulse sequences of the brain and surrounding structures were obtained without intravenous contrast. COMPARISON:  CT head without contrast 08/21/2017. FINDINGS: Brain: A punctate nonhemorrhagic infarct is present in the posterior limb of the left internal capsule and lentiform nucleus. No acute hemorrhage or mass lesion is present. Associated T2 signal changes are present. No other acute infarcts are present. Moderate generalized atrophy is present. There is mild periventricular white matter changes bilaterally. Dilated perivascular spaces are present within the basal ganglia. Internal auditory canals are within normal limits bilaterally. The brainstem and cerebellum are  normal. Vascular: Flow is present in the major intracranial arteries. Skull and upper cervical spine: Skull base is within normal limits.  Craniocervical junction is normal. The upper cervical spine demonstrates multilevel degenerative changes in at C3-4, C4-5, and C5-6. Sinuses/Orbits: Paranasal sinuses and mastoid air cells are clear. Globes and orbits are within normal limits bilaterally. IMPRESSION: 1. Punctate nonhemorrhagic infarct in the posterior limb of the left internal capsule corresponds with right-sided weakness. This likely impacts the cortical spinal tracts. 2. Advanced atrophy and mild white matter changes bilaterally likely reflect the sequela of chronic microvascular ischemia. 3. Multilevel degenerative changes in the cervical spine. Electronically Signed   By: Marin Robertshristopher  Mattern M.D.   On: 08/21/2017 17:00   [place] Carotid Bilateral  Result Date: 08/21/2017 CLINICAL DATA:  Right leg weakness. Slurred speech. Small infarct seen on today's MRI. EXAM: BILATERAL CAROTID DUPLEX ULTRASOUND TECHNIQUE: Wallace CullensGray scale imaging, color Doppler and duplex ultrasound were performed of bilateral carotid and vertebral arteries in the neck. COMPARISON:  None. FINDINGS: Criteria: Quantification of carotid stenosis is based on velocity parameters that correlate the residual internal carotid diameter with NASCET-based stenosis levels, using the diameter of the distal internal carotid lumen as the denominator for stenosis measurement. The following velocity measurements were obtained: RIGHT ICA: 74/24 cm/sec CCA: 55/12 cm/sec SYSTOLIC ICA/CCA RATIO:  1.4 ECA:  48 cm/sec LEFT ICA: 88/30 cm/sec CCA: 86/17 cm/sec SYSTOLIC ICA/CCA RATIO:  1.0 ECA:  125 cm/sec RIGHT CAROTID ARTERY: Mild atherosclerotic change in the common internal carotid artery. No significant stenosis visualized. RIGHT VERTEBRAL ARTERY:  Antegrade flow. LEFT CAROTID ARTERY: Mild plaque seen in the CCA and ICA. No significant stenosis visualized. LEFT VERTEBRAL ARTERY:  Antegrade flow IMPRESSION: 1. No significant stenosis seen in either the right or left ICA. Electronically Signed   By:  Gerome Samavid  Williams III M.D   On: 08/21/2017 17:41   ASSESSMENT AND PLAN:  82 year old male with past medical history of hypertension, hyperlipidemia, previous history of Parkinson's who presents to the hospital due to right lower extremity weakness.  1.  Acute CVA with right lower extremity weakness.   CT head is negative for acute pathology. MRI of brain show acute CVA. Carotid duplex is unremarkable, echocardiogram is pending.  continue aspirin, change to Lipitor.  Add Plavix per Dr. Thad Rangereynolds. Follow-up PT consult.  2.  Essential hypertension-continue Norvasc, hydrochlorothiazide.  3.  Hyperlipidemia- change to Lipitor Lipitor  Hypokalemia.  Give potassium supplement and follow-up level.  Discussed with Dr. Thad Rangereynolds. All the records are reviewed and case discussed with Care Management/Social Worker. Management plans discussed with the patient, his daughter and they are in agreement.  CODE STATUS: Full Code  TOTAL TIME TAKING CARE OF THIS PATIENT:  33 minutes.   More than 50% of the time was spent in counseling/coordination of care: YES  POSSIBLE D/C IN 2 DAYS, DEPENDING ON CLINICAL CONDITION.   Shaune PollackQing Linley Moskal M.D on 08/22/2017 at 3:30 PM  Between 7am to 6pm - Pager - [phone]  After 6pm go to [email or web address] - Therapist, nutritionalpassword [email or web address]  [facility] [place] [facility]

## 2017-08-22 NOTE — Clinical Social Work Note (Signed)
CSW spoke with patient's daughter Franky Machoam Clement to give bed offers. [name] states that she would like to look into these facilities and call CSW back with her choice. CSW gave daughter her phone number. CSW will follow up with daughter again tomorrow if she does not hear from her. CSW will continue to follow for discharge planning.   Ruthe Mannanandace Latravious Levitt MSW, [address] RdCSWA [phone]

## 2017-08-22 NOTE — [place] FL2 (Signed)
[place] MEDICAID FL2 LEVEL OF CARE SCREENING TOOL     IDENTIFICATION  Patient Name: Kevin MarbleHenry Clay Picking Birthdate: 03/30/1935 Sex: male Admission Date (Current Location): 08/21/2017  [place]   Facility and Address:  [facility], [address], [place], [place] [address]      Provider Number: [id number]  Attending Physician Name and Address:  Shaune Pollackhen, Qing, MD  Relative Name and Phone Number:  Franky Machoam Clement- daughter [phone]    Current Level of Care: Hospital Recommended Level of Care: Skilled Nursing Facility Prior Approval Number:    Date Approved/Denied:   PASRR Number: [id number] [id number]  Discharge Plan: SNF    Current Diagnoses: Patient Active Problem List   Diagnosis Date Noted  . Acute CVA (cerebrovascular accident) (HCC) 08/22/2017  . Right leg weakness 08/21/2017  . 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 CAP (community acquired pneumonia) 01/06/2016  . Productive cough 11/09/2010  . Hypertension 11/04/2010  . Hyperlipidemia 11/04/2010    Orientation RESPIRATION BLADDER Height & Weight     Self, Time, Place  Normal Continent Weight: 151 lb 14.4 oz (68.9 kg) Height:  5\' 1"  (154.9 cm)  BEHAVIORAL SYMPTOMS/MOOD NEUROLOGICAL BOWEL NUTRITION STATUS  (none) (none) Continent Diet(Heart Healthy )  AMBULATORY STATUS COMMUNICATION OF NEEDS Skin   Extensive Assist Verbally Normal                       Personal Care Assistance Level of Assistance  Bathing, Feeding, Dressing Bathing Assistance: Limited assistance Feeding assistance: Independent Dressing Assistance: Limited assistance     Functional Limitations Info  Sight, Hearing, Speech Sight Info: Adequate Hearing Info: Adequate Speech Info: Adequate    SPECIAL CARE FACTORS FREQUENCY  PT (By licensed PT), OT (By licensed OT)     PT Frequency: 5 OT Frequency: 5            Contractures Contractures Info: Not present    Additional Factors  Info  Code Status, Allergies Code Status Info: Full Code  Allergies Info: NKA           Current Medications (08/22/2017):  This is the current hospital active medication list Current Facility-Administered Medications  Medication Dose Route Frequency Provider Last Rate Last Dose  . acetaminophen (TYLENOL) tablet 650 mg  650 mg Oral Q6H PRN [place] SirenSainani, Vivek J, MD       Or  . acetaminophen (TYLENOL) suppository 650 mg  650 mg Rectal Q6H PRN [place] SirenSainani, Vivek J, MD      . amLODipine (NORVASC) tablet 5 mg  5 mg Oral Daily [place] SirenSainani, Vivek J, MD   5 mg at 08/22/17 0957  . aspirin chewable tablet 81 mg  81 mg Oral Daily Shaune Pollackhen, Qing, MD   81 mg at 08/22/17 0957  . atorvastatin (LIPITOR) tablet 80 mg  80 mg Oral q1800 Shaune Pollackhen, Qing, MD      . clopidogrel (PLAVIX) tablet 75 mg  75 mg Oral Daily Shaune Pollackhen, Qing, MD   75 mg at 08/22/17 0957  . enoxaparin (LOVENOX) injection 40 mg  40 mg Subcutaneous Q24H [place] SirenSainani, Vivek J, MD   40 mg at 08/21/17 2058  . hydrochlorothiazide (HYDRODIURIL) tablet 25 mg  25 mg Oral Daily Houston SirenSainani, Vivek J, MD   25 mg at 08/22/17 0957  . ondansetron (ZOFRAN) tablet 4 mg  4 mg Oral Q6H PRN [place] SirenSainani, Vivek J, MD       Or  . ondansetron (ZOFRAN) injection 4 mg  4 mg Intravenous Q6H  PRN [place] SirenSainani, Vivek J, MD      . potassium chloride SA (K-DUR,KLOR-CON) CR tablet 20 mEq  20 mEq Oral BID [place] SirenSainani, Vivek J, MD   20 mEq at 08/22/17 [id number]     Discharge Medications: Please see discharge summary for a list of discharge medications.  Relevant Imaging Results:  Relevant Lab Results:   Additional Information SSN [id number]  Ruthe Mannanandace  Isay Perleberg, [place]

## 2017-08-23 ENCOUNTER — Inpatient Hospital Stay
Admit: 2017-08-23 | Discharge: 2017-08-23 | Disposition: A | Discharge status: Home / Self Care | Payer: Medicare Other | Attending: Specialist | Admitting: Specialist

## 2017-08-23 LAB — MAGNESIUM: Magnesium: 2.1 mg/dL (ref 1.7–2.4)

## 2017-08-23 LAB — BASIC METABOLIC PANEL
Anion gap: 7 (ref 5–15)
BUN: 17 mg/dL (ref 8–23)
CHLORIDE: 102 mmol/L (ref 98–111)
CO2: 31 mmol/L (ref 22–32)
CREATININE: 1.03 mg/dL (ref 0.61–1.24)
Calcium: 8.8 mg/dL — ABNORMAL LOW (ref 8.9–10.3)
GFR calc Af Amer: >60 mL/min (ref >60)
GFR calc non Af Amer: >60 mL/min (ref >60)
GLUCOSE: 107 mg/dL — AB (ref 70–99)
POTASSIUM: 3 mmol/L — AB (ref 3.5–5.1)
Sodium: 140 mmol/L (ref 135–145)

## 2017-08-23 LAB — ECHOCARDIOGRAM COMPLETE
Height: 61 in
Weight: 2430.4 oz

## 2017-08-23 MED ORDER — ATORVASTATIN CALCIUM 80 MG PO TABS: 80.0000 mg | ORAL_TABLET | Freq: Every day | ORAL | 1 refills | Status: DC

## 2017-08-23 MED ORDER — POTASSIUM CHLORIDE CRYS ER 20 MEQ PO TBCR
60.0000 meq | EXTENDED_RELEASE_TABLET | Freq: Once | ORAL | Status: AC
  Administered 2017-08-23: 14:00:00 60 meq via ORAL
  Filled 2017-08-23: qty 3

## 2017-08-23 MED ORDER — CLOPIDOGREL BISULFATE 75 MG PO TABS: 75.0000 mg | ORAL_TABLET | Freq: Every day | ORAL | 1 refills | Status: DC

## 2017-08-23 NOTE — Progress Notes (Signed)
Subjective: Patient state he is doing well this morning. He continues to have improvement in his speech and strength. He still reports weakness on the right side. Denies any new complaints today.  Objective: Current vital signs: BP (!) 149/97 (BP Location: Left Arm)   Pulse (!) 113   Temp 97.9 F (36.6 C) (Oral)   Resp 17   Ht 5\' 1"  (1.549 m)   Wt 68.9 kg   SpO2 100%   BMI 28.70 kg/m  Vital signs in last 24 hours: Temp:  [97.9 F (36.6 C)-98 F (36.7 C)] 97.9 F (36.6 C) (08/13 0505) Pulse Rate:  [79-113] 113 (08/13 0505) Resp:  [15-17] 17 (08/13 0505) BP: (126-149)/(79-97) 149/97 (08/13 0505) SpO2:  [95 %-100 %] 100 % (08/13 0505)  Intake/Output from previous day: 08/12 0701 - 08/13 0700 In: 720 [P.O.:720] Out: 450 [Urine:450] Intake/Output this shift: Total I/O In: 120 [P.O.:120] Out: -  Nutritional status:  Diet Order            Diet - low sodium heart healthy        Diet Heart Room service appropriate? Yes; Fluid consistency: Thin  Diet effective now             Physical Exam   Vitals Blood pressure (!) 149/97, pulse (!) 113, temperature 97.9 F (36.6 C), temperature source Oral, resp. rate 17, height 5\' 1"  (1.549 m), weight 68.9 kg, SpO2 100 %.  General Exam . Patient looks appropriate of age, well built, nourished and appropriately groomed.  . Cardiovascular Exam: S1, S2 heart sounds present  . Carotid exam revealed no bruit  . Lung exam was clear to auscultation   Neurological Exam . Alert,  . Oriented to time, place, person  . Attention span and concentration seemed appropriate  . Language seemed mildly impaired due to mild slurred speech otherwise intact naming and comprehension)  . Pupils were reactive to light, extra-ocular movements are normal  . Face is symmetric  . Palate and uvular movements are normal  . Tongue protrusion was midline  . Tone is normal in all extremities, no abnormal movements seen  . Muscle strength in right upper and  lower extremities decreased . Deep tendon reflexes were symmetric  . Sensations were intact to light touch in all extremities  . Gait and station is slow and unstead when examined in small exam room. Required assistance to stand from sitting position, using a walker in the room. Right mild hemiparetic gait, improved from previous exam.  Lab Results: Basic Metabolic Panel: Recent Labs  Lab 08/21/17 1042 08/22/17 0723 08/23/17 0417  NA 140 137 140  K 3.2* 3.1* 3.0*  CL 103 99 102  CO2 26 28 31   GLUCOSE 121* 120* 107*  BUN 14 13 17   CREATININE 1.12 0.91 1.03  CALCIUM 9.7 9.1 8.8*  MG  --  1.8 2.1    Liver Function Tests: Recent Labs  Lab 08/21/17 1042  AST 37  ALT 23  ALKPHOS 41  BILITOT 0.9  PROT 7.9  ALBUMIN 4.4   No results for input(s): LIPASE, AMYLASE in the last 168 hours. No results for input(s): AMMONIA in the last 168 hours.  CBC: Recent Labs  Lab 08/21/17 1042  WBC 9.1  NEUTROABS 6.9*  HGB 16.6  HCT 47.2  MCV 92.2  PLT 243    Cardiac Enzymes: Recent Labs  Lab 08/21/17 1042  TROPONINI <0.03    Lipid Panel: Recent Labs  Lab 08/22/17 0723  CHOL 180  TRIG 174*  HDL 45  CHOLHDL 4.0  VLDL 35  LDLCALC 161100*    CBG: Recent Labs  Lab 08/21/17 1051  GLUCAP 131*    Microbiology: Results for orders placed or performed during the hospital encounter of 01/06/16  Culture, blood (Routine X 2) w Reflex to ID Panel     Status: None   Collection Time: 01/06/16  2:36 PM  Result Value Ref Range Status   Specimen Description BLOOD  R AC  Final   Special Requests   Final    BOTTLES DRAWN AEROBIC AND ANAEROBIC  ANA 2 AER 4 ML   Culture NO GROWTH 5 DAYS  Final   Report Status 01/11/2016 FINAL  Final  Culture, blood (Routine X 2) w Reflex to ID Panel     Status: None   Collection Time: 01/06/16  2:36 PM  Result Value Ref Range Status   Specimen Description BLOOD L AC  Final   Special Requests BOTTLES DRAWN AEROBIC AND ANAEROBIC  ANA 17 AER 16   Final   Culture NO GROWTH 5 DAYS  Final   Report Status 01/11/2016 FINAL  Final    Coagulation Studies: Recent Labs    08/21/17 1042  LABPROT 12.2  INR 0.91    Imaging: Ct Head Wo Contrast  Result Date: 08/21/2017 CLINICAL DATA:  Right leg weakness since 3 a.m. today. Fell this morning. EXAM: CT HEAD WITHOUT CONTRAST TECHNIQUE: Contiguous axial images were obtained from the base of the skull through the vertex without intravenous contrast. COMPARISON:  None. FINDINGS: Brain: Moderate diffuse cerebral atrophy and mild diffuse cerebellar atrophy. Mild patchy white matter low density in both cerebral hemispheres. No intracranial hemorrhage, mass lesion or CT evidence of acute infarction. Vascular: No hyperdense vessel or unexpected calcification. Skull: Normal. Negative for fracture or focal lesion. Sinuses/Orbits: Unremarkable. Other: None. IMPRESSION: 1. No acute abnormality. 2. Moderate atrophy and mild chronic small vessel white matter ischemic changes in both cerebral hemispheres. Electronically Signed   By: Beckie SaltsSteven  Reid M.D.   On: 08/21/2017 10:28   Mr Brain Wo Contrast  Result Date: 08/21/2017 CLINICAL DATA:  Neuro deficits, subacute. Right-sided weakness beginning over 24 hours ago. EXAM: MRI HEAD WITHOUT CONTRAST TECHNIQUE: Multiplanar, multiecho pulse sequences of the brain and surrounding structures were obtained without intravenous contrast. COMPARISON:  CT head without contrast 08/21/2017. FINDINGS: Brain: A punctate nonhemorrhagic infarct is present in the posterior limb of the left internal capsule and lentiform nucleus. No acute hemorrhage or mass lesion is present. Associated T2 signal changes are present. No other acute infarcts are present. Moderate generalized atrophy is present. There is mild periventricular white matter changes bilaterally. Dilated perivascular spaces are present within the basal ganglia. Internal auditory canals are within normal limits bilaterally. The  brainstem and cerebellum are normal. Vascular: Flow is present in the major intracranial arteries. Skull and upper cervical spine: Skull base is within normal limits. Craniocervical junction is normal. The upper cervical spine demonstrates multilevel degenerative changes in at C3-4, C4-5, and C5-6. Sinuses/Orbits: Paranasal sinuses and mastoid air cells are clear. Globes and orbits are within normal limits bilaterally. IMPRESSION: 1. Punctate nonhemorrhagic infarct in the posterior limb of the left internal capsule corresponds with right-sided weakness. This likely impacts the cortical spinal tracts. 2. Advanced atrophy and mild white matter changes bilaterally likely reflect the sequela of chronic microvascular ischemia. 3. Multilevel degenerative changes in the cervical spine. Electronically Signed   By: Marin Robertshristopher  Mattern M.D.   On: 08/21/2017 17:00  US Carotid Bilateral  Result Date: 08/21/2017 CLINICAL DATA:  Right leg weakness. Slurred speech. Small infarct seen on today's MRI. EXAM: BILATERAL CAROTID DUPLEX ULTRASOUND TECHNIQUE: Wallace Cullens scale imaging, color Doppler and duplex ultrasound were performed of bilateral carotid and vertebral arteries in the neck. COMPARISON:  None. FINDINGS: Criteria: Quantification of carotid stenosis is based on velocity parameters that correlate the residual internal carotid diameter with NASCET-based stenosis levels, using the diameter of the distal internal carotid lumen as the denominator for stenosis measurement. The following velocity measurements were obtained: RIGHT ICA: 74/24 cm/sec CCA: 55/12 cm/sec SYSTOLIC ICA/CCA RATIO:  1.4 ECA:  48 cm/sec LEFT ICA: 88/30 cm/sec CCA: 86/17 cm/sec SYSTOLIC ICA/CCA RATIO:  1.0 ECA:  125 cm/sec RIGHT CAROTID ARTERY: Mild atherosclerotic change in the common internal carotid artery. No significant stenosis visualized. RIGHT VERTEBRAL ARTERY:  Antegrade flow. LEFT CAROTID ARTERY: Mild plaque seen in the CCA and ICA. No significant  stenosis visualized. LEFT VERTEBRAL ARTERY:  Antegrade flow IMPRESSION: 1. No significant stenosis seen in either the right or left ICA. Electronically Signed   By: Gerome Sam III M.D   On: 08/21/2017 17:41    Medications: I have reviewed the patient's current medications.  Patient seen and examined.  Clinical course and management discussed.  Necessary edits performed.  I agree with the above.  Assessment and plan of care developed and discussed below.    Assessment/Plan: No new strokes or stroke like symptoms thus far. Symptoms continues to improve with mild residual effect. Carotid dopplers unremarkable with no significant stenosis.  Echocardiogram pending.  1. Will continue to work with PT,OT and Speech therapist as recommended. 2. Continue  aggressive medical management with dual therapy Aspirin 81 mg/day and Plavix 75 mg /day with intensive management of vascular risk factor to keep systolic BP (SBP) <140 mm Hg (782 mm Hg if diabetic) and low density lipoprotein (LDL) <70 mg/dl, and lifestyle modification.    LOS: 1 day   08/23/2017  10:21 AM   Thana Farr, MD Neurology [phone]  08/23/2017  12:10 PM

## 2017-08-23 NOTE — Progress Notes (Signed)
Physical Therapy Treatment Patient Details Name: Kevin Bradshaw MRN: [id number] DOB: 11/17/1935 Today's Date: 08/23/2017    History of Present Illness Pt is a 82 y.o. 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 PMH includes hypertension, hyperlipidemia, history of Parkinson's disease.    PT Comments    Pt is progressing well towards goals today, but is still very limited by R sided weakness.  Pt was able to preform bed mobility with supervision and verbal cueing, transfers sit<>stand with CGA, and ambulate 20' with Min A 2/2. See below for mobility details. PT noted L side trunk elongation with subsequent shortening on R side that improved with sitting balance reaching and verbal cueing to sit up tall. Current d/c recommendations continue to remain appropriate, but PT will continue to assess pt during hospital stay.  Follow Up Recommendations  SNF     Equipment Recommendations  None recommended by PT    Recommendations for Other Services       Precautions / Restrictions Precautions Precautions: Fall Restrictions Weight Bearing Restrictions: No    Mobility  Bed Mobility Overal bed mobility: Needs Assistance Bed Mobility: Supine to Sit     Supine to sit: Supervision     General bed mobility comments: Pt required supervision with mild varbal cueing for saftey and motor planning  Transfers Overall transfer level: Needs assistance Equipment used: Rolling walker (2 wheeled) Transfers: Sit to/from Stand Sit to Stand: Min guard         General transfer comment: Pt required CGA at gait belt for sit<>stand for unsteadiness. Pt benefited from verbal cueing for proper use of UEs and sequencing.    Ambulation/Gait Ambulation/Gait assistance: Min assist Gait Distance (Feet): 20 Feet Assistive device: Rolling walker (2 wheeled) Gait Pattern/deviations: Step-through pattern     General Gait Details: Pt ambulated 20' with RW/Min A/chair follow 2/2 weakness, poor sequencing, and diminished balance. Pt benefited from consistent verbal cueing for proper use of RW. No knee buckling noted. Pt did experience LOB 2x with ambulation and persistent R side lean. Pt was able to incease stide length with verabal cue of "lift dots on sock up and forward"   Stairs             Wheelchair Mobility    Modified Rankin (Stroke Patients Only)       Balance Overall balance assessment: Needs assistance Sitting-balance support: No upper extremity supported;Feet supported Sitting balance-Leahy Scale: Good     Standing balance support: Bilateral upper extremity supported Standing balance-Leahy Scale: Poor Standing balance comment: Pt had right side lean with standing that did imrove mildy with mulimodal cueing.                            Cognition Arousal/Alertness: Awake/alert Behavior During Therapy: WFL for tasks assessed/performed Overall Cognitive Status: Within Functional Limits for tasks assessed                                 General Comments: Pt able to answer complex questions and follow multi step commands.      Exercises Other Exercises Other Exercises: Pt instructed in supine there.ex to include ankle  pumps x10, quad sets x10, hip abd/add x10, and SLR x10 all on R LE. Pt instructed in sit to stand there.ex x5 with PT CGA Other Exercises: Pt practiced sitting balance ther.ac that included no UE supporeted and reaching in mutli directions with alternating UEs x10    General Comments        Pertinent Vitals/Pain Pain Assessment: No/denies pain    Home Living                      Prior Function            PT Goals (current goals  can now be found in the care plan section) Acute Rehab PT Goals Patient Stated Goal: return to PLOF PT Goal Formulation: With patient Time For Goal Achievement: 09/05/17 Potential to Achieve Goals: Good Progress towards PT goals: Progressing toward goals    Frequency    7X/week      PT Plan Current plan remains appropriate    Co-evaluation              AM-PAC PT "6 Clicks" Daily Activity  Outcome Measure  Difficulty turning over in bed (including adjusting bedclothes, sheets and blankets)?: A Lot Difficulty moving from lying on back to sitting on the side of the bed? : A Little Difficulty sitting down on and standing up from a chair with arms (e.g., wheelchair, bedside commode, etc,.)?: Unable Help needed moving to and from a bed to chair (including a wheelchair)?: A Little Help needed walking in hospital room?: A Little Help needed climbing 3-5 steps with a railing? : A Lot 6 Click Score: 14    End of Session Equipment Utilized During Treatment: Gait belt Activity Tolerance: Patient tolerated treatment well;Patient limited by fatigue Patient left: in chair;with call bell/phone within reach;with chair alarm set Nurse Communication: Mobility status PT Visit Diagnosis: Unsteadiness on feet (R26.81);Other abnormalities of gait and mobility (R26.89);Muscle weakness (generalized) (M62.81);Difficulty in walking, not elsewhere classified (R26.2);Hemiplegia and hemiparesis Hemiplegia - Right/Left: Right Hemiplegia - dominant/non-dominant: Dominant Hemiplegia - caused by: Cerebral infarction     Time: [id number] PT Time Calculation (min) (ACUTE ONLY): 23 min  Charges:                        Kevin Bradshaw, SPT    Kevin Bradshaw 08/23/2017, 1:53 PM

## 2017-08-23 NOTE — Discharge Instructions (Signed)
HHPT °

## 2017-08-23 NOTE — Progress Notes (Signed)
[facility] [place] [facility]   PATIENT NAME: Kevin NordmannHenry Bradshaw    MR#:  [id number]  DATE OF BIRTH:  10/05/1935  SUBJECTIVE:  CHIEF COMPLAINT:   Chief Complaint  Patient presents with  . Weakness   Right leg weakness, unable to stand on right leg. REVIEW OF SYSTEMS:  Review of Systems  Constitutional: Negative for chills, fever and malaise/fatigue.  HENT: Negative for sore throat.   Eyes: Negative for blurred vision and double vision.  Respiratory: Negative for cough, hemoptysis, shortness of breath, wheezing and stridor.   Cardiovascular: Negative for chest pain, palpitations, orthopnea and leg swelling.  Gastrointestinal: Negative for abdominal pain, blood in stool, diarrhea, melena, nausea and vomiting.  Genitourinary: Negative for dysuria, flank pain and hematuria.  Musculoskeletal: Negative for back pain and joint pain.  Skin: Negative for rash.  Neurological: Positive for focal weakness and weakness. Negative for dizziness, sensory change, seizures, loss of consciousness and headaches.  Endo/Heme/Allergies: Negative for polydipsia.  Psychiatric/Behavioral: Negative for depression. The patient is not nervous/anxious.     DRUG ALLERGIES:  No Known Allergies VITALS:  Blood pressure 139/80, pulse 87, temperature 98.4 F (36.9 C), temperature source Oral, resp. rate 20, height 5\' 1"  (1.549 m), weight 68.9 kg, SpO2 92 %. PHYSICAL EXAMINATION:  Physical Exam  Constitutional: He is oriented to person, place, and time. He appears well-developed.  HENT:  Head: Normocephalic.  Mouth/Throat: Oropharynx is clear and moist.  Eyes: Pupils are equal, round, and reactive to light. Conjunctivae and EOM are normal. No scleral icterus.  Neck: Normal range of motion. Neck supple. No JVD present. No tracheal deviation present.  Cardiovascular: Normal rate, regular rhythm and normal heart sounds. Exam reveals no gallop.  No murmur heard. Pulmonary/Chest: Effort normal  and breath sounds normal. No respiratory distress. He has no wheezes. He has no rales.  Abdominal: Soft. Bowel sounds are normal. He exhibits no distension. There is no tenderness. There is no rebound.  Musculoskeletal: Normal range of motion. He exhibits no edema or tenderness.  Neurological: He is alert and oriented to person, place, and time. No cranial nerve deficit.  Right leg weakness 4/5.  Skin: No rash noted. No erythema.   LABORATORY PANEL:  Male CBC Recent Labs  Lab 08/21/17 1042  WBC 9.1  HGB 16.6  HCT 47.2  PLT 243   ------------------------------------------------------------------------------------------------------------------ Chemistries  Recent Labs  Lab 08/21/17 1042  08/23/17 0417  NA 140   < > 140  K 3.2*   < > 3.0*  CL 103   < > 102  CO2 26   < > 31  GLUCOSE 121*   < > 107*  BUN 14   < > 17  CREATININE 1.12   < > 1.03  CALCIUM 9.7   < > 8.8*  MG  --    < > 2.1  AST 37  --   --   ALT 23  --   --   ALKPHOS 41  --   --   BILITOT 0.9  --   --    < > = values in this interval not displayed.   RADIOLOGY:  No results found. ASSESSMENT AND PLAN:  82 year old male with past medical history of hypertension, hyperlipidemia, previous history of Parkinson's who presents to the hospital due to right lower extremity weakness.  1.  Acute CVA with right lower extremity weakness.   CT head is negative for acute pathology. MRI of brain show acute CVA. Carotid duplex  is unremarkable, echocardiogram: LV EF: 55% -   60%  continue aspirin, changed to Lipitor.  Added Plavix per Dr. Thad Bradshaw. PT:SNF.  2.  Essential hypertension-continue Norvasc, hydrochlorothiazide.  3.  Hyperlipidemia- change to Lipitor Lipitor  Hypokalemia.  Given potassium supplement and follow-up level.  The patient needs more PT at least twice a day, hopefully go home with HHPT. The patient and daughter want him to go to SNF. All the records are reviewed and case discussed with Care  Management/Social Worker. Management plans discussed with the patient, his daughter and they are in agreement.  CODE STATUS: Full Code  TOTAL TIME TAKING CARE OF THIS PATIENT:  32 minutes.   More than 50% of the time was spent in counseling/coordination of care: YES  POSSIBLE D/C IN 1 DAYS, DEPENDING ON CLINICAL CONDITION.   Kevin PollackQing Kevin Bradshaw M.D on 08/23/2017 at 2:40 PM  Between 7am to 6pm - Pager - [phone]  After 6pm go to [email or web address] - Therapist, nutritionalpassword EPAS [facility]  [facility]

## 2017-08-23 NOTE — Clinical Social Work Note (Signed)
Patient's daughter Franky Machoam Clement [phone] called CSW this morning and would like patient to go to [facility]. CSW notified Sue Lushndrea, admissions coordinator at [facility] of bed acceptance. CSW will continue to follow for discharge planning.   Ruthe Mannanandace Naquita Nappier MSW, [address]CSWA [phone]

## 2017-08-23 NOTE — Clinical Social Work Placement (Addendum)
   CLINICAL SOCIAL WORK PLACEMENT  NOTE  Date:  08/23/2017  Patient Details  Name: Kevin Bradshaw MRN: [id number] Date of Birth: 09/18/1935  Clinical Social Work is seeking post-discharge placement for this patient at the Skilled  Nursing Facility level of care (*CSW will initial, date and re-position this form in  chart as items are completed):  Yes   Patient/family provided with [facility] Clinical Social Work Department's list of facilities offering this level of care within the geographic area requested by the patient (or if unable, by the patient's family).  Yes   Patient/family informed of their freedom to choose among providers that offer the needed level of care, that participate in Medicare, Medicaid or managed care program needed by the patient, have an available bed and are willing to accept the patient.  Yes   Patient/family informed of [facility]'s ownership interest in [facility] and [facility], as well as of the fact that they are under no obligation to receive care at these facilities.  PASRR submitted to EDS on 08/22/17     PASRR number received on       Existing PASRR number confirmed on 08/22/17     FL2 transmitted to all facilities in geographic area requested by pt/family on 08/22/17     FL2 transmitted to all facilities within larger geographic area on       Patient informed that his/her managed care company has contracts with or will negotiate with certain facilities, including the following:        Yes   Patient/family informed of bed offers received.  [facility]     Physician recommends and patient chooses bed at [facility](SNF)    [facility] .  Patient to be transferred to facility by EMS     Patient family notified on  of transfer.  Name of family member notified:       PHYSICIAN       Additional Comment:    _______________________________________________ Kevin Mannanandace  Bradshaw,  LCSWA 08/23/2017, 8:36 AM   Updated Kevin Bradshaw, MSW, Theresia MajorsLCSWA [phone]  08/24/2017 5:11 PM

## 2017-08-23 NOTE — Progress Notes (Signed)
*  PRELIMINARY RESULTS* Echocardiogram 2D Echocardiogram has been performed.  Kevin Bradshaw 08/23/2017, 10:00 AM

## 2017-08-23 NOTE — Progress Notes (Signed)
Pharmacy Electrolyte Monitoring  Pharmacy has been consulted for management of electrolytes in this 82 year old male admitted for right leg weakness and acute CVA  Labs: 8/13  K 3.0          Mg 2.1  Assessment/Plan: Patient continued on PTA potassium 20 mEq PO BID. Will also give 60 mEq PO x 1 dose for a total daily dose of 100 mEq potassium.  No need for magnesium replacement.  Will order electrolytes with morning labs.  Pharmacy will continue to monitor.  Mauri ReadingSavanna M Martin, PharmD Pharmacy Resident  08/23/2017 7:08 AM

## 2017-08-24 LAB — BASIC METABOLIC PANEL
Anion gap: 8 (ref 5–15)
BUN: 23 mg/dL (ref 8–23)
CHLORIDE: 103 mmol/L (ref 98–111)
CO2: 29 mmol/L (ref 22–32)
CREATININE: 0.99 mg/dL (ref 0.61–1.24)
Calcium: 8.9 mg/dL (ref 8.9–10.3)
GFR calc non Af Amer: >60 mL/min (ref >60)
Glucose, Bld: 109 mg/dL — ABNORMAL HIGH (ref 70–99)
Potassium: 3.4 mmol/L — ABNORMAL LOW (ref 3.5–5.1)
SODIUM: 140 mmol/L (ref 135–145)

## 2017-08-24 LAB — MAGNESIUM: MAGNESIUM: 1.9 mg/dL (ref 1.7–2.4)

## 2017-08-24 MED ORDER — POTASSIUM CHLORIDE CRYS ER 20 MEQ PO TBCR
20.0000 meq | EXTENDED_RELEASE_TABLET | Freq: Once | ORAL | Status: AC
  Administered 2017-08-24: 20 meq via ORAL
  Filled 2017-08-24: qty 1

## 2017-08-24 NOTE — Progress Notes (Signed)
[employer] - [place] at [facility]   PATIENT NAME: Kevin NordmannHenry Bradshaw    MR#:  [id number]  DATE OF BIRTH:  04/08/1935  SUBJECTIVE:  CHIEF COMPLAINT:   Chief Complaint  Patient presents with  . Weakness   Right leg weakness persists.  Sitting in a chair  REVIEW OF SYSTEMS:  Review of Systems  Constitutional: Negative for chills, fever and malaise/fatigue.  HENT: Negative for sore throat.   Eyes: Negative for blurred vision and double vision.  Respiratory: Negative for cough, hemoptysis, shortness of breath, wheezing and stridor.   Cardiovascular: Negative for chest pain, palpitations, orthopnea and leg swelling.  Gastrointestinal: Negative for abdominal pain, blood in stool, diarrhea, melena, nausea and vomiting.  Genitourinary: Negative for dysuria, flank pain and hematuria.  Musculoskeletal: Negative for back pain and joint pain.  Skin: Negative for rash.  Neurological: Positive for focal weakness and weakness. Negative for dizziness, sensory change, seizures, loss of consciousness and headaches.  Endo/Heme/Allergies: Negative for polydipsia.  Psychiatric/Behavioral: Negative for depression. The patient is not nervous/anxious.     DRUG ALLERGIES:  No Known Allergies VITALS:  Blood pressure (!) 144/72, pulse 81, temperature 97.6 F (36.4 C), temperature source Oral, resp. rate 16, height 5\' 1"  (1.549 m), weight 68.9 kg, SpO2 95 %. PHYSICAL EXAMINATION:  Physical Exam  Constitutional: He is oriented to person, place, and time. He appears well-developed.  HENT:  Head: Normocephalic.  Mouth/Throat: Oropharynx is clear and moist.  Eyes: Pupils are equal, round, and reactive to light. Conjunctivae and EOM are normal. No scleral icterus.  Neck: Normal range of motion. Neck supple. No JVD present. No tracheal deviation present.  Cardiovascular: Normal rate, regular rhythm and normal heart sounds. Exam reveals no gallop.  No murmur heard. Pulmonary/Chest: Effort  normal and breath sounds normal. No respiratory distress. He has no wheezes. He has no rales.  Abdominal: Soft. Bowel sounds are normal. He exhibits no distension. There is no tenderness. There is no rebound.  Musculoskeletal: Normal range of motion. He exhibits no edema or tenderness.  Neurological: He is alert and oriented to person, place, and time. No cranial nerve deficit.  Right leg weakness 4/5.  Skin: No rash noted. No erythema.   LABORATORY PANEL:  Male CBC Recent Labs  Lab 08/21/17 1042  WBC 9.1  HGB 16.6  HCT 47.2  PLT 243   ------------------------------------------------------------------------------------------------------------------ Chemistries  Recent Labs  Lab 08/21/17 1042  08/24/17 0453  NA 140   < > 140  K 3.2*   < > 3.4*  CL 103   < > 103  CO2 26   < > 29  GLUCOSE 121*   < > 109*  BUN 14   < > 23  CREATININE 1.12   < > 0.99  CALCIUM 9.7   < > 8.9  MG  --    < > 1.9  AST 37  --   --   ALT 23  --   --   ALKPHOS 41  --   --   BILITOT 0.9  --   --    < > = values in this interval not displayed.   RADIOLOGY:  No results found. ASSESSMENT AND PLAN:  82 year old male with past medical history of hypertension, hyperlipidemia, previous history of Parkinson's who presents to the hospital due to right lower extremity weakness.  1.  Acute CVA with right lower extremity weakness.   CT head is negative for acute pathology. MRI of brain show acute CVA.  Carotid duplex is unremarkable, echocardiogram: LV EF: 55% -   60%  continue aspirin, . Plavix and lipitor added PT:SNF  2.  Essential hypertension-continue Norvasc, hydrochlorothiazide.  3.  Hyperlipidemia- change to Lipitor Lipitor  4. Hypokalemia. supplemented  All the records are reviewed and case discussed with Care Management/Social Worker. Management plans discussed with the patient, his daughter and they are in agreement.  CODE STATUS: Full Code  TOTAL TIME TAKING CARE OF THIS PATIENT:  30  minutes.   POSSIBLE D/C IN 1-2 DAYS, DEPENDING ON CLINICAL CONDITION.   Orie FishermanSrikar R Maurianna Benard M.D on 08/24/2017 at 12:39 PM  Between 7am to 6pm - Pager - [phone]  After 6pm go to [email or web address] - Therapist, nutritionalpassword EPAS [facility]  [facility] [place] [facility]

## 2017-08-24 NOTE — Clinical Social Work Note (Signed)
CSW spoke to [facility] and they can accept patient on Thursday if he is medically ready for discharge and orders have been received.  CSW updated physician, bedside nurse, and patient, patient expressed that he is looking forward to going to [facility] to [facility] his therapy.  CSW to continue to follow patient's progress throughout discharge planning.  Kevin Bradshaw, MSW, Kevin Bradshaw [phone]  08/24/2017 5:10 PM

## 2017-08-24 NOTE — Progress Notes (Signed)
Physical Therapy Treatment Patient Details Name: Kevin Bradshaw Site MRN: [id number] DOB: 09/11/1935 Today's Date: 08/24/2017    History of Present Illness Pt is a 82 y.o. 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 PMH includes hypertension, hyperlipidemia, history of Parkinson's disease.    PT Comments    Pt is progressing well towards goals today, but is still very limited by R sided weakness.  Pt was able to preform bed mobility with supervision and verbal cueing, transfers sit<>stand with CGA, and ambulate 6460' with Min A for safety. See below for mobility details. PT noted improved sitting posture today and pt was able to progress reaching out side of BOS while sitting with a focus on over head to L reaching to shorten L side side trunk. Current d/c recommendations continue to remain appropriate, but PT will continue to assess pt during hospital stay.   Follow Up Recommendations  SNF     Equipment Recommendations  None recommended by PT    Recommendations for Other Services       Precautions / Restrictions Precautions Precautions: Fall Restrictions Weight Bearing Restrictions: No    Mobility  Bed Mobility Overal bed mobility: Needs Assistance Bed Mobility: Supine to Sit     Supine to sit: Supervision     General bed mobility comments: Pt required supervision for saftey   Transfers Overall transfer level: Needs assistance Equipment used: Rolling walker (2 wheeled) Transfers: Sit to/from Stand Sit to Stand: Min guard         General transfer comment: Pt required CGA at gait belt for sit<>stand for unsteadiness. Pt benefited from verbal cueing for proper use of UEs and sequencing.    Ambulation/Gait Ambulation/Gait assistance: Min assist Gait Distance (Feet): 60 Feet Assistive device: Rolling walker (2 wheeled) Gait Pattern/deviations: Step-through pattern     General Gait Details: Pt ambulated 3860' with RW/Min A/chair follow 2/2 weakness, poor sequencing, and diminished balance. Pt benefited from consistent verbal/tactile cueing for proper use of RW. No knee buckling noted. Pt did experience LOB 4x with ambulation and very little R side lean. Pt was able to incease stide length with verabal cue of "lift dots on sock up and forward"   Stairs             Wheelchair Mobility    Modified Rankin (Stroke Patients Only)       Balance Overall balance assessment: Needs assistance Sitting-balance support: No upper extremity supported;Feet supported Sitting balance-Leahy Scale: Good Sitting balance - Comments: Pt able to increase reaching out side of BOS while sitting.   Standing balance support: Bilateral upper extremity supported Standing balance-Leahy Scale: Poor                              Cognition Arousal/Alertness: Awake/alert Behavior During Therapy: WFL for tasks assessed/performed Overall Cognitive Status: Within Functional Limits for tasks assessed                                 General Comments: Pt able to answer complex questions and follow multi step commands.      Exercises Other Exercises Other Exercises: Pt instructed in supine  there.ex to include ankle pumps x15, quad/glute sets x15, heel slides x15, hip abd/add x15, and SLR x15 all on R LE.  Other Exercises: Pt practiced sitting balance ther.ac that included no UE supported and reaching in mutli directions with alternating UEs x20. Foucused on UE reaching into flexion and L side    General Comments        Pertinent Vitals/Pain Pain Assessment: No/denies pain    Home Living                      Prior Function            PT Goals  (current goals can now be found in the care plan section) Acute Rehab PT Goals Patient Stated Goal: return to PLOF PT Goal Formulation: With patient Time For Goal Achievement: 09/05/17 Potential to Achieve Goals: Good Progress towards PT goals: Progressing toward goals    Frequency    7X/week      PT Plan Current plan remains appropriate    Co-evaluation              AM-PAC PT "6 Clicks" Daily Activity  Outcome Measure  Difficulty turning over in bed (including adjusting bedclothes, sheets and blankets)?: A Little Difficulty moving from lying on back to sitting on the side of the bed? : A Little Difficulty sitting down on and standing up from a chair with arms (e.g., wheelchair, bedside commode, etc,.)?: Unable Help needed moving to and from a bed to chair (including a wheelchair)?: A Little Help needed walking in hospital room?: A Little Help needed climbing 3-5 steps with a railing? : A Lot 6 Click Score: 15    End of Session Equipment Utilized During Treatment: Gait belt Activity Tolerance: Patient tolerated treatment well;Patient limited by fatigue Patient left: in chair;with call bell/phone within reach;with chair alarm set Nurse Communication: Mobility status PT Visit Diagnosis: Unsteadiness on feet (R26.81);Other abnormalities of gait and mobility (R26.89);Muscle weakness (generalized) (M62.81);Difficulty in walking, not elsewhere classified (R26.2);Hemiplegia and hemiparesis Hemiplegia - Right/Left: Right Hemiplegia - dominant/non-dominant: Dominant Hemiplegia - caused by: Cerebral infarction     Time: 0912-0937 PT Time Calculation (min) (ACUTE ONLY): 25 min  Charges:                        Renaldo HarrisonStephen Piers Baade, SPT    Renaldo HarrisonStephen Levita Monical 08/24/2017, 10:24 AM

## 2017-08-24 NOTE — Final Progress Note (Signed)
Subjective: Patient continues to improve, he has been up with PT with noted improvement. He denies speech difficulty or any new symptoms. Anticipate discharge to SNF with PT.  Objective: Current vital signs: BP 92/66 (BP Location: Left Arm)   Pulse 78   Temp (!) 97.3 F (36.3 C) (Oral)   Resp 18   Ht 5\' 1"  (1.549 m)   Wt 68.9 kg   SpO2 99%   BMI 28.70 kg/m  Vital signs in last 24 hours: Temp:  [97.3 F (36.3 C)-98.4 F (36.9 C)] 97.3 F (36.3 C) (08/14 1326) Pulse Rate:  [78-87] 78 (08/14 1326) Resp:  [16-20] 18 (08/14 1326) BP: (92-144)/(66-80) 92/66 (08/14 1326) SpO2:  [92 %-99 %] 99 % (08/14 1326)  Intake/Output from previous day: 08/13 0701 - 08/14 0700 In: 360 [P.O.:360] Out: 850 [Urine:850] Intake/Output this shift: Total I/O In: 240 [P.O.:240] Out: 200 [Urine:200] Nutritional status:  Diet Order            Diet - low sodium heart healthy        Diet Heart Room service appropriate? Yes; Fluid consistency: Thin  Diet effective now             Physical Exam   Vitals Blood pressure 92/66, pulse 78, temperature (!) 97.3 F (36.3 C), temperature source Oral, resp. rate 18, height 5\' 1"  (1.549 m), weight 68.9 kg, SpO2 99 %.   General Exam . Patient looks appropriate of age, well built, nourished and appropriately groomed.  . Cardiovascular Exam: S1, S2 heart sounds present  . Carotid exam revealed no bruit  . Lung exam was clear to auscultation  .  Neurological Exam . Alert,  . Oriented to time, place, person  . Attention span and concentration seemed appropriate  . Language seemed intact (naming, spontaneous speech, comprehension)  I. Olfactory not examined II: Visual fields were full. Pupils were equal, round and reactive to light and accommodation III,IV, VI: ptosis not present, extra-ocular motions intact bilaterally V,VII: smile symmetric, facial light touch sensation normal bilaterally VIII: Finger rub was heard symmetric in both ears IX, X:  Palate and uvular movements are normal and oral sensations are OK, gag reflex deffered XI: Neck muscle strength and shoulder shrug is normal XII: midline tongue extension . Tone is normal in all extremities, no abnormal movements seen  . Muscle strength decreased in right lower extremities otherwise normal in all other extremities. . Deep tendon reflexes were symmetric  . Sensations were intact to light touch in all extremities  . Gait and station are slow and unsteady when examined in small exam room, using a walker with right mild hemiparetic gait noted.  Lab Results: Basic Metabolic Panel: Recent Labs  Lab 08/21/17 1042 08/22/17 0723 08/23/17 0417 08/24/17 0453  NA 140 137 140 140  K 3.2* 3.1* 3.0* 3.4*  CL 103 99 102 103  CO2 26 28 31 29   GLUCOSE 121* 120* 107* 109*  BUN 14 13 17 23   CREATININE 1.12 0.91 1.03 0.99  CALCIUM 9.7 9.1 8.8* 8.9  MG  --  1.8 2.1 1.9    Liver Function Tests: Recent Labs  Lab 08/21/17 1042  AST 37  ALT 23  ALKPHOS 41  BILITOT 0.9  PROT 7.9  ALBUMIN 4.4   No results for input(s): LIPASE, AMYLASE in the last 168 hours. No results for input(s): AMMONIA in the last 168 hours.  CBC: Recent Labs  Lab 08/21/17 1042  WBC 9.1  NEUTROABS 6.9*  HGB 16.6  HCT 47.2  MCV 92.2  PLT 243    Cardiac Enzymes: Recent Labs  Lab 08/21/17 1042  TROPONINI <0.03    Lipid Panel: Recent Labs  Lab 08/22/17 0723  CHOL 180  TRIG 174*  HDL 45  CHOLHDL 4.0  VLDL 35  LDLCALC 409100*    CBG: Recent Labs  Lab 08/21/17 1051  GLUCAP 131*    Microbiology: Results for orders placed or performed during the hospital encounter of 01/06/16  Culture, blood (Routine X 2) w Reflex to ID Panel     Status: None   Collection Time: 01/06/16  2:36 PM  Result Value Ref Range Status   Specimen Description BLOOD  R AC  Final   Special Requests   Final    BOTTLES DRAWN AEROBIC AND ANAEROBIC  ANA 2 AER 4 ML   Culture NO GROWTH 5 DAYS  Final   Report Status  01/11/2016 FINAL  Final  Culture, blood (Routine X 2) w Reflex to ID Panel     Status: None   Collection Time: 01/06/16  2:36 PM  Result Value Ref Range Status   Specimen Description BLOOD L AC  Final   Special Requests BOTTLES DRAWN AEROBIC AND ANAEROBIC  ANA 17 AER 16  Final   Culture NO GROWTH 5 DAYS  Final   Report Status 01/11/2016 FINAL  Final    Coagulation Studies: No results for input(s): LABPROT, INR in the last 72 hours.  Imaging: No results found.  Medications:  I have reviewed the patient's current medications. Scheduled: . amLODipine  5 mg Oral Daily  . aspirin  81 mg Oral Daily  . atorvastatin  80 mg Oral q1800  . clopidogrel  75 mg Oral Daily  . enoxaparin (LOVENOX) injection  40 mg Subcutaneous Q24H  . hydrochlorothiazide  25 mg Oral Daily  . potassium chloride SA  20 mEq Oral BID  . potassium chloride  20 mEq Oral Once    Patient seen and examined.  Clinical course and management discussed.  Necessary edits performed.  I agree with the above.  Assessment and plan of care developed and discussed below.    Assessment No new strokes or stroke like symptoms thus far. Symptoms continue to improve with minimal residual effect on the right leg. Anticipate discharge today to SNF with PT. Carotid dopplersunremarkable with no significant stenosis.  Echocardiogram normal with no source of cardiac emboli, EF 55-60%.  Plan - Continue  aggressive medical management with dual therapy Aspirin 81 mg/day and Plavix 75 mg /day with intensive management of vascular risk factor to keep systolic BP (SBP) <140 mm Hg (811130 mm Hg if diabetic) and low density lipoprotein (LDL) <70 mg/dl, and lifestyle modification. Recommend next available outpatient Neurology follow up. - Continue PT,OT and Speech therapist as recommended.    LOS: 2 days   Thana FarrLeslie Lakendria Nicastro, MD Neurology [phone] 08/24/2017  1:29 PM

## 2017-08-24 NOTE — Progress Notes (Signed)
Pharmacy Electrolyte Monitoring  Pharmacy has been consulted for management of electrolytes in this 82 year old male admitted for right leg weakness and acute CVA  Labs: 8/13  K 3.0          Mg 2.1 8/14  K = 3.4          Mg = 1.9  Assessment/Plan: Patient continued on PTA potassium 20 mEq PO BID. No need for additional potassium replacement.  No need for magnesium replacement.  Will order electrolytes with morning labs.  Pharmacy will continue to monitor.  Mauri ReadingSavanna M Martin, PharmD Pharmacy Resident  08/24/2017 7:08 AM

## 2017-08-24 NOTE — Evaluation (Signed)
Occupational Therapy Evaluation Patient Details Name: Kevin Bradshaw MRN: [id number] DOB: 1935-04-17 Today's Date: 08/24/2017    History of Present Illness Pt is a 82 y.o. 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 PMH includes hypertension, hyperlipidemia, history of Parkinson's disease.   Clinical Impression   Pt seen for OT evaluation this date. Prior to hospital admission, pt was independent with basic ADL, had recently started driving again, and endorses a couple falls in past 12 months 2/2 BLE "giving out" on him. Pt reports he used to love wood working projects in his workshop but stopped after he recovered from PNA approx 3YA. That is also around the time the pt stopped driving (until about 1MA). Pt receives assist from family for medication set up and for meal prep. Pt lives with his daughter and son in [place]. Currently pt demonstrates impairments in strength, mild coordination deficits in RUE, impaired activity tolerance, and motor planning 2/2 Parkinson's. Pt able to perform seated ADL tasks relatively well requiring supervision assist. CGA to min assist required for LB ADL during transitional movements to standing. CGA for transfers and Min A for ambulation with RW and cues for sequencing and for exaggerated movements. Pt educated in Parkinson's therapy and exercise programs available in the area. Also educated in exaggerated movement patterns to support improved quality of movement and safety during ADL. Pt would benefit from skilled OT to address noted impairments and functional limitations (see below for any additional details) in order to maximize safety and independence while minimizing falls risk and caregiver burden.   Upon hospital discharge, recommend pt discharge to [facility].     Follow Up Recommendations  SNF    Equipment Recommendations  Other (comment)(TBD)    Recommendations for Other Services       Precautions / Restrictions Precautions Precautions: Fall Restrictions Weight Bearing Restrictions: No      Mobility Bed Mobility     General bed mobility comments: deferred, up in recliner at start and end of session  Transfers Overall transfer level: Needs assistance Equipment used: Rolling walker (2 wheeled) Transfers: Sit to/from Stand Sit to Stand: Min guard         General transfer comment: VC for hand placement with RW    Balance Overall balance assessment: Needs assistance Sitting-balance support: No upper extremity supported;Feet supported Sitting balance-Leahy Scale: Good Sitting balance - Comments: Pt able to increase reaching out side of BOS while sitting.   Standing balance support: Bilateral upper extremity supported Standing balance-Leahy Scale: Poor                             ADL either performed or assessed with clinical judgement   ADL Overall ADL's : Needs assistance/impaired Eating/Feeding: Sitting;Independent   Grooming: Sitting;Independent   Upper Body Bathing: Sitting;Supervision/ safety;Set up   Lower Body Bathing: Sit to/from stand;Minimal assistance;Min guard Lower Body Bathing Details (indicate cue type and reason): during transitional movements Upper Body Dressing : Sitting;Supervision/safety;Set up   Lower Body Dressing: Sit to/from stand;Minimal assistance;Min guard Lower Body Dressing Details (indicate cue type and reason): during transitional movements Toilet Transfer: RW;Minimal assistance;Cueing for sequencing;Comfort height toilet;Ambulation  Vision Baseline Vision/History: Wears glasses Wears Glasses: At all times Patient Visual Report: No change from baseline       Perception     Praxis       Pertinent Vitals/Pain Pain Assessment: No/denies pain     Hand Dominance Right   Extremity/Trunk Assessment Upper Extremity Assessment Upper Extremity Assessment: Generalized weakness;RUE deficits/detail(grossly 4/5 bilaterally, intact sensation) RUE Deficits / Details: mild coordination impairments with finger to nose and thumb opposition testing RUE Coordination: decreased fine motor   Lower Extremity Assessment Lower Extremity Assessment: Generalized weakness;Defer to PT evaluation   Cervical / Trunk Assessment Cervical / Trunk Assessment: Normal   Communication Communication Communication: No difficulties(soft spoken)   Cognition Arousal/Alertness: Awake/alert Behavior During Therapy: WFL for tasks assessed/performed Overall Cognitive Status: Within Functional Limits for tasks assessed                                 General Comments: Pt able to answer complex questions and follow multi step commands.   General Comments       Exercises Exercises: Other exercises Other Exercises Other Exercises: Pt educated in [place] and [facility] programs as well as Tai Chi for Parkinson's classes to consider in the future to address his Parkinson's symptoms Other Exercises: Pt educated in exaggerated movement patterns to improve coordination and movement quality during ADL and mobility tasks.  Other Exercises: Pt educated in AE to improve independence and safety with ADL.    Shoulder Instructions      Home Living Family/patient expects to be discharged to:: Private residence Living Arrangements: Children(daughter and son in law) Available Help at Discharge: Family(dtr works, son in law retired and generally around 24/7) Type of Home: [place] Access: Stairs to enter Technical brewer of Steps: 2 Entrance Stairs-Rails: None Home Layout: One level     Bathroom Shower/Tub: Occupational psychologist: [place] Accessibility: Yes How  Accessible: Accessible via [place]: [place] - standard;Shower seat;Grab bars - tub/shower          Prior Functioning/Environment Level of Independence: Needs assistance  Gait / Transfers Assistance Needed: Mod I with standard walker ADL's / Homemaking Assistance Needed: Pt needed assistance with meal prep and medication mgt. Endorses "a couple" falls in past 56mo2/2 BLE "giving out". Pt indep with bathing, dressing, toileting, self feeding PTA. After getting PNA approx 3YA, pt stopped driving until about 1MA when he started back driving some.             OT Problem List: Decreased strength;Decreased knowledge of use of DME or AE;Decreased coordination;Decreased activity tolerance;Impaired balance (sitting and/or standing)      OT Treatment/Interventions: Self-care/ADL training;Balance training;Therapeutic exercise;Therapeutic activities;Neuromuscular education;DME and/or AE instruction;Patient/family education    OT Goals(Current goals can be found in the care plan section) Acute Rehab OT Goals Patient Stated Goal: return to PLOF OT Goal Formulation: With patient Time For Goal Achievement: 09/07/17 Potential to Achieve Goals: Good ADL Goals Pt Will Transfer to Toilet: with supervision;ambulating(LRAD for ambulation) Additional ADL Goal #1: Pt will perform functional transitional movements during ADL tasks with supervision and min cues for exaggerated movements to minimize impact of Parkinson's sx and maximize safety/independence.  OT Frequency: Min 1X/week   Barriers to D/C:            Co-evaluation              AM-PAC PT "6 Clicks" Daily  Activity     Outcome Measure Help from another person eating meals?: None Help from another person taking care of personal grooming?: None Help from another person toileting, which includes using toliet, bedpan, or urinal?: A Little Help from another person bathing (including washing, rinsing, drying)?: A Little Help  from another person to put on and taking off regular upper body clothing?: A Little Help from another person to put on and taking off regular lower body clothing?: A Little 6 Click Score: 20   End of Session Nurse Communication: Other (comment)(Spoke with CNA re: end of session)  Activity Tolerance: Patient tolerated treatment well Patient left: in chair;with call bell/phone within reach;with chair alarm set  OT Visit Diagnosis: Other abnormalities of gait and mobility (R26.89);Repeated falls (R29.6);Muscle weakness (generalized) (M62.81)                Time: 9923-4144 OT Time Calculation (min): 25 min Charges:  OT General Charges $OT Visit: 1 Visit OT Evaluation $OT Eval Low Complexity: 1 Low OT Treatments $Self Care/Home Management : 8-22 mins  Jeni Salles, MPH, MS, OTR/L ascom [phone] 08/24/17, 11:15 AM

## 2017-08-24 NOTE — Progress Notes (Signed)
OT Cancellation Note  Patient Details Name: Kevin Bradshaw MRN: [id number] DOB: 04/13/1935   Cancelled Treatment:    Reason Eval/Treat Not Completed: Other (comment). Order received, chart reviewed. Upon initial attempt, pt with CNA in bathroom. CNA requesting OT come back shortly. Will re-attempt OT evaluation.   Richrd PrimeJamie Stiller, MPH, MS, OTR/L ascom [phone] 08/24/17, 10:20 AM

## 2017-08-25 LAB — BASIC METABOLIC PANEL
Anion gap: 9 (ref 5–15)
BUN: 20 mg/dL (ref 8–23)
CO2: 29 mmol/L (ref 22–32)
Calcium: 9.1 mg/dL (ref 8.9–10.3)
Chloride: 102 mmol/L (ref 98–111)
Creatinine, Ser: 0.9 mg/dL (ref 0.61–1.24)
GFR calc Af Amer: >60 mL/min (ref >60)
GLUCOSE: 105 mg/dL — AB (ref 70–99)
POTASSIUM: 3.5 mmol/L (ref 3.5–5.1)
Sodium: 140 mmol/L (ref 135–145)

## 2017-08-25 LAB — MAGNESIUM: Magnesium: 1.8 mg/dL (ref 1.7–2.4)

## 2017-08-25 MED ORDER — MAGNESIUM SULFATE 2 GM/50ML IV SOLN
2.0000 g | Freq: Once | INTRAVENOUS | Status: AC
  Administered 2017-08-25: 2 g via INTRAVENOUS
  Filled 2017-08-25: qty 50

## 2017-08-25 NOTE — Progress Notes (Signed)
EMS called for transport to [facility]. Shelma Eiben S, RN  

## 2017-08-25 NOTE — Progress Notes (Addendum)
Patient is medically stable for D/C to [facility] today. Per Sue LushAndrea admissions coordinator at [facility] patient can come today to room 326. RN will call report at [phone] and arrange EMS for transport. Clinical Child psychotherapistocial Worker (CSW) sent D/C orders to [facility] via [facility]. Patient is aware of above. CSW left patient's daughter [name] a Engineer, technical salesvoicemail. CSW also contacted patient's son in law [name] and made him aware of above. Please reconsult if future social work needs arise. CSW signing off.   Patient's daughter [name] called CSW back and was made aware of above.   Baker Hughes IncorporatedBailey Cortney Beissel, LCSW [phone]

## 2017-08-25 NOTE — Progress Notes (Signed)
Physical Therapy Treatment Patient Details Name: Kevin Bradshaw MRN: [id number] DOB: 07/22/1935 Today's Date: 08/25/2017    History of Present Illness Pt is a 82 y.o. 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 PMH includes hypertension, hyperlipidemia, history of Parkinson's disease.    PT Comments    Pt is progressing well towards goals today, but is still limited by R sided weakness. Bed mobility deferred 2/2 pt found in recliner and preference to return to recliner. Pt transfers sit<>stand with CGA, and ambulate 3680' with Min A for safety. See below for mobility details. PT noted improved sitting posture today and pt was able to progress reaching out side of BOS while sitting with a focus on over head to L reaching to shorten L side side trunk. PT focused on hip strengthening there.ex. Current d/c recommendations continue to remain appropriate, but PT will continue to assess pt during hospital stay.   Follow Up Recommendations  SNF     Equipment Recommendations  None recommended by PT    Recommendations for Other Services       Precautions / Restrictions Precautions Precautions: Fall Restrictions Weight Bearing Restrictions: No    Mobility  Bed Mobility               General bed mobility comments: deferred, up in recliner at start and end of session  Transfers Overall transfer level: Needs assistance Equipment used: Rolling walker (2 wheeled) Transfers: Sit to/from Stand Sit to Stand: Min guard         General transfer comment: pt beneifited from CGA for safety  Ambulation/Gait Ambulation/Gait assistance: Min assist Gait Distance (Feet): 80 Feet Assistive device: Rolling walker (2  wheeled) Gait Pattern/deviations: Step-through pattern     General Gait Details: Pt ambulated 10380' with RW/Min A 2/2 weakness, poor sequencing, and diminished balance. Pt benefited from consistent verbal/tactile cueing for proper use of RW. No knee buckling noted. Pt did experience LOB 2x with ambulation. Pt was able to incease stide length with verabal cue of "lift dots on sock up and forward". With no cueing pt has festenating/shuffle gait and pushes RW too far away.   Stairs             Wheelchair Mobility    Modified Rankin (Stroke Patients Only)       Balance Overall balance assessment: Needs assistance Sitting-balance support: No upper extremity supported;Feet supported Sitting balance-Leahy Scale: Good Sitting balance - Comments: Pt able to increase reaching out side of BOS while sitting.   Standing balance support: Bilateral upper extremity supported Standing balance-Leahy Scale: Poor                              Cognition Arousal/Alertness: Awake/alert Behavior During Therapy: WFL for tasks assessed/performed Overall Cognitive Status: Within Functional Limits for tasks assessed                                        Exercises Other Exercises Other Exercises: pt instructed in seated there.ex to include hip flexor marching x10, LAQ x10, PT resisted hip/add x10, Shoulder flexion x10 all bilateral. Pt  was fatigued with seated there.ex.  Other Exercises: Pt practiced sitting balance ther.ac that included no UE supported and reaching in mutli directions with alternating UEs x20. Foucused on UE reaching into flexion and L side    General Comments        Pertinent Vitals/Pain Pain Assessment: No/denies pain    Home Living                      Prior Function            PT Goals (current goals can now be found in the care plan section) Acute Rehab PT Goals Patient Stated Goal: return to PLOF PT Goal Formulation: With  patient Time For Goal Achievement: 09/05/17 Potential to Achieve Goals: Good Progress towards PT goals: Progressing toward goals    Frequency    7X/week      PT Plan Current plan remains appropriate    Co-evaluation              AM-PAC PT "6 Clicks" Daily Activity  Outcome Measure  Difficulty turning over in bed (including adjusting bedclothes, sheets and blankets)?: A Little Difficulty moving from lying on back to sitting on the side of the bed? : A Little Difficulty sitting down on and standing up from a chair with arms (e.g., wheelchair, bedside commode, etc,.)?: Unable Help needed moving to and from a bed to chair (including a wheelchair)?: A Little Help needed walking in hospital room?: A Little Help needed climbing 3-5 steps with a railing? : A Lot 6 Click Score: 15    End of Session Equipment Utilized During Treatment: Gait belt Activity Tolerance: Patient tolerated treatment well;Patient limited by fatigue Patient left: in chair;with call bell/phone within reach;with chair alarm set Nurse Communication: Mobility status PT Visit Diagnosis: Unsteadiness on feet (R26.81);Other abnormalities of gait and mobility (R26.89);Muscle weakness (generalized) (M62.81);Difficulty in walking, not elsewhere classified (R26.2);Hemiplegia and hemiparesis Hemiplegia - Right/Left: Right Hemiplegia - dominant/non-dominant: Dominant Hemiplegia - caused by: Cerebral infarction     Time: [id number] PT Time Calculation (min) (ACUTE ONLY): 42 min  Charges:                        Renaldo HarrisonStephen Braylan Faul, SPT    Renaldo HarrisonStephen Heydi Swango 08/25/2017, 12:05 PM

## 2017-08-25 NOTE — Progress Notes (Signed)
Called [facility] to give report. Report given to OakesdaleJessy, Charity fundraiserN.

## 2017-08-25 NOTE — Clinical Social Work Placement (Addendum)
   CLINICAL SOCIAL WORK PLACEMENT  NOTE  Date:  08/25/2017  Patient Details  Name: Kevin Bradshaw MRN: [id number] Date of Birth: 08/01/1935  Clinical Social Work is seeking post-discharge placement for this patient at the Skilled  Nursing Facility level of care (*CSW will initial, date and re-position this form in  chart as items are completed):  Yes   Patient/family provided with [facility] Clinical Social Work Department's list of facilities offering this level of care within the geographic area requested by the patient (or if unable, by the patient's family).  Yes   Patient/family informed of their freedom to choose among providers that offer the needed level of care, that participate in Medicare, Medicaid or managed care program needed by the patient, have an available bed and are willing to accept the patient.  Yes   Patient/family informed of [facility]'s ownership interest in [facility] and [facility], as well as of the fact that they are under no obligation to receive care at these facilities.  PASRR submitted to EDS on       PASRR number received on       Existing PASRR number confirmed on 08/22/17     FL2 transmitted to all facilities in geographic area requested by pt/family on 08/22/17     FL2 transmitted to all facilities within larger geographic area on       Patient informed that his/her managed care company has contracts with or will negotiate with certain facilities, including the following:        Yes   Patient/family informed of bed offers received.  [facility] bed [facility]     Physician recommends and patient chooses bed at [facility](SNF)    Patient to be transferred to [facility] on 08/25/17.  Patient to be transferred to facility by EMS     Patient family notified on 08/25/17 of transfer.  Name of family member notified:  Patient's son in law [name] is aware of D/C today.  Patient's daughter [name] is also aware of D/C today.   PHYSICIAN    Additional Comment:    _______________________________________________ Vlada Uriostegui, Darleen CrockerBailey M, LCSW 08/25/2017, 9:36 AM

## 2017-08-25 NOTE — Discharge Summary (Signed)
[employer] [facility] -  at [facility]   PATIENT NAME: Kevin Bradshaw    MR#:  [id number]  DATE OF BIRTH:  07/29/1935  DATE OF ADMISSION:  08/21/2017 ADMITTING PHYSICIAN: Houston Siren, MD  DATE OF DISCHARGE: 08/25/2017  PRIMARY CARE PHYSICIAN: Gracelyn Nurse, MD   ADMISSION DIAGNOSIS:  Cerebral infarction [facility]) [I63.9] CVA (cerebral infarction) [I63.9] Cerebrovascular accident (CVA), unspecified mechanism (HCC) [I63.9]  DISCHARGE DIAGNOSIS:  Active Problems:   Right leg weakness   Acute CVA (cerebrovascular accident) (HCC)  SECONDARY DIAGNOSIS:   Past Medical History:  Diagnosis Date  . Hyperlipidemia   . Hyperlipidemia   . Hypertension   . Parkinson disease (HCC)    ADMITTING HISTORY  HISTORY OF PRESENT ILLNESS:  Kevin Bradshaw  is a 82 y.o. male with a known history of hypertension, hyperlipidemia, history of Parkinson's disease who presents to the hospital due to right leg weakness.  Patient says he was in his usual state of health when this morning he developed worsening right lower extremity weakness.  Patient does have a history of Parkinson's but is currently not on any meds and does not have any intentional tremor or weakness secondary to his Parkinson's.  He does usually use a walker to ambulate but today his right leg was feeling increasingly weak and it gives way when he puts any weight on it.  He denies any headache, nausea, vomiting, chest pains or shortness of breath.  Patient denies ever having a previous stroke in the past.  He denies any right upper extremity or right facial numbness or weakness.  Patient presented to the ER underwent a CT of his head which was negative for acute pathology but continues to have significant right-sided weakness and therefore hospitalist services were contacted for admission.  HOSPITAL COURSE:   82 year old male with past medical history of hypertension, hyperlipidemia, previous history of Parkinson's who presents to the  hospital due to right lower extremity weakness.  1. Acute CVA with right lower extremity weakness. CT head is negative for acute pathology. MRI of brain show acute CVA. Carotid duplex is unremarkable, echocardiogram: LV EF: 55% - 60%. continue aspirin, . Plavix and lipitor added. PT - SNF.  2. Essential hypertension-continue Norvasc, hydrochlorothiazide.  3. Hyperlipidemia- change to Lipitor Lipitor.  4. Hypokalemia - supplemented  Stable for discharge to SNF.  CONSULTS OBTAINED:  Treatment Team:  Thana Farr, MD  DRUG ALLERGIES:  No Known Allergies  DISCHARGE MEDICATIONS:   Allergies as of 08/25/2017   No Known Allergies     Medication List    STOP taking these medications   lovastatin 40 MG tablet Commonly known as:  MEVACOR     TAKE these medications   amLODipine 5 MG tablet Commonly known as:  NORVASC Take 5 mg by mouth daily.   aspirin 81 MG tablet Take 81 mg by mouth daily.   atorvastatin 80 MG tablet Commonly known as:  LIPITOR Take 1 tablet (80 mg total) by mouth daily at 6 PM.   clopidogrel 75 MG tablet Commonly known as:  PLAVIX Take 1 tablet (75 mg total) by mouth daily.   hydrochlorothiazide 25 MG tablet Commonly known as:  HYDRODIURIL Take 25 mg by mouth daily.   KLOR-CON M20 20 MEQ tablet Generic drug:  potassium chloride SA Take 20 mEq by mouth 2 (two) times daily.      Today   VITAL SIGNS:  Blood pressure 117/76, pulse 90, temperature (!) 97.5 F (36.4 C), temperature source Oral, resp.  rate 16, height 5\' 1"  (1.549 m), weight 68.9 kg, SpO2 96 %.  I/O:    Intake/Output Summary (Last 24 hours) at 08/25/2017 0729 Last data filed at 08/25/2017 0446 Gross per 24 hour  Intake 240 ml  Output 750 ml  Net -510 ml   PHYSICAL EXAMINATION:  Physical Exam  GENERAL:  82 y.o.-year-old patient lying in the bed with no acute distress.  LUNGS: Normal breath sounds bilaterally, no wheezing, rales,rhonchi or crepitation. No use  of accessory muscles of respiration.  CARDIOVASCULAR: S1, S2 normal. No murmurs, rubs, or gallops.  ABDOMEN: Soft, non-tender, non-distended. Bowel sounds present. No organomegaly or mass.  NEUROLOGIC: RLE weakness PSYCHIATRIC: The patient is alert and oriented x 3.  SKIN: No obvious rash, lesion, or ulcer.   DATA REVIEW:   CBC Recent Labs  Lab 08/21/17 1042  WBC 9.1  HGB 16.6  HCT 47.2  PLT 243    Chemistries  Recent Labs  Lab 08/21/17 1042  08/25/17 0432  NA 140   < > 140  K 3.2*   < > 3.5  CL 103   < > 102  CO2 26   < > 29  GLUCOSE 121*   < > 105*  BUN 14   < > 20  CREATININE 1.12   < > 0.90  CALCIUM 9.7   < > 9.1  MG  --    < > 1.8  AST 37  --   --   ALT 23  --   --   ALKPHOS 41  --   --   BILITOT 0.9  --   --    < > = values in this interval not displayed.    Cardiac Enzymes Recent Labs  Lab 08/21/17 1042  TROPONINI <0.03    Microbiology Results  Results for orders placed or performed during the hospital encounter of 01/06/16  Culture, blood (Routine X 2) w Reflex to ID Panel     Status: None   Collection Time: 01/06/16  2:36 PM  Result Value Ref Range Status   Specimen Description BLOOD  R AC  Final   Special Requests   Final    BOTTLES DRAWN AEROBIC AND ANAEROBIC  ANA 2 AER 4 ML   Culture NO GROWTH 5 DAYS  Final   Report Status 01/11/2016 FINAL  Final  Culture, blood (Routine X 2) w Reflex to ID Panel     Status: None   Collection Time: 01/06/16  2:36 PM  Result Value Ref Range Status   Specimen Description BLOOD L AC  Final   Special Requests BOTTLES DRAWN AEROBIC AND ANAEROBIC  ANA 17 AER 16  Final   Culture NO GROWTH 5 DAYS  Final   Report Status 01/11/2016 FINAL  Final    RADIOLOGY:  No results found.  Follow up with PCP in 1 week.  Management plans discussed with the patient, family and they are in agreement.  CODE STATUS:     Code Status Orders  (From admission, onward)         Start     Ordered   08/21/17 1302  Full code   Continuous     08/21/17 1301        Code Status History    Date Active Date Inactive Code Status Order ID Comments User Context   01/06/2016 1752 01/08/2016 1456 Full Code [id number]  Hower, Cletis Athensavid K, MD ED      TOTAL TIME TAKING CARE OF THIS PATIENT ON  DAY OF DISCHARGE: more than 30 minutes.   Orie FishermanSrikar R Tou Hayner M.D on 08/25/2017 at 7:29 AM  Between 7am to 6pm - Pager - [phone]  After 6pm go to [email or web address] - password [email or web address] [facility]  [facility] [place] [facility]  Office  [phone]  CC: Primary care physician; Gracelyn NurseJohnston, John D, MD  Note: This dictation was prepared with Dragon dictation along with smaller phrase technology. Any transcriptional errors that result from this process are unintentional.

## 2017-08-25 NOTE — Care Management Important Message (Signed)
Important Message  Patient Details  Name: Kevin Bradshaw MRN: [id number] Date of Birth: 06/03/1935   Medicare Important Message Given:  Yes    Olegario MessierKathy A Nilesh Stegall 08/25/2017, 10:49 AM

## 2017-08-25 NOTE — Progress Notes (Signed)
Pharmacy Electrolyte Monitoring  Pharmacy has been consulted for management of electrolytes in this 82 year old male admitted for right leg weakness and acute CVA  Labs: 8/13  K 3.0          Mg 2.1 8/14  K = 3.4          Mg = 1.9 8/15  K 3.5          Mg 1.8  Assessment/Plan: Patient continued on PTA potassium 20 mEq PO BID. No need for additional potassium replacement. Replaced Mg with Mg sulfate 2 g IV x 1.  Will order electrolytes with morning labs.  Pharmacy will continue to monitor.  Mauri ReadingSavanna M Martin, PharmD Pharmacy Resident  08/25/2017 7:16 AM

## 2017-08-29 DIAGNOSIS — G8191 Hemiplegia, unspecified affecting right dominant side: Secondary | ICD-10-CM

## 2017-08-29 DIAGNOSIS — G20 Parkinson's disease: Secondary | ICD-10-CM | POA: Diagnosis not present

## 2017-08-29 DIAGNOSIS — G3184 Mild cognitive impairment, so stated: Secondary | ICD-10-CM | POA: Diagnosis not present

## 2017-08-29 DIAGNOSIS — I10 Essential (primary) hypertension: Secondary | ICD-10-CM | POA: Diagnosis not present

## 2018-02-18 ENCOUNTER — Emergency Department: Payer: Medicare Other

## 2018-02-18 ENCOUNTER — Inpatient Hospital Stay
Admission: EM | Admit: 2018-02-18 | Discharge: 2018-02-21 | DRG: 872 | Disposition: A | Discharge status: Skilled Nursing Facility | Payer: Medicare Other | Attending: Internal Medicine | Admitting: Internal Medicine

## 2018-02-18 ENCOUNTER — Other Ambulatory Visit: Payer: Self-pay

## 2018-02-18 DIAGNOSIS — R059 Cough, unspecified: Secondary | ICD-10-CM

## 2018-02-18 DIAGNOSIS — R05 Cough: Secondary | ICD-10-CM

## 2018-02-18 DIAGNOSIS — Z8673 Personal history of transient ischemic attack (TIA), and cerebral infarction without residual deficits: Secondary | ICD-10-CM

## 2018-02-18 DIAGNOSIS — A419 Sepsis, unspecified organism: Secondary | ICD-10-CM

## 2018-02-18 DIAGNOSIS — Z87891 Personal history of nicotine dependence: Secondary | ICD-10-CM

## 2018-02-18 DIAGNOSIS — R531 Weakness: Secondary | ICD-10-CM

## 2018-02-18 DIAGNOSIS — Z79899 Other long term (current) drug therapy: Secondary | ICD-10-CM

## 2018-02-18 DIAGNOSIS — Z7982 Long term (current) use of aspirin: Secondary | ICD-10-CM | POA: Diagnosis not present

## 2018-02-18 DIAGNOSIS — E785 Hyperlipidemia, unspecified: Secondary | ICD-10-CM | POA: Diagnosis present

## 2018-02-18 DIAGNOSIS — E872 Acidosis: Secondary | ICD-10-CM | POA: Diagnosis present

## 2018-02-18 DIAGNOSIS — J00 Acute nasopharyngitis [common cold]: Secondary | ICD-10-CM | POA: Diagnosis present

## 2018-02-18 DIAGNOSIS — Z8249 Family history of ischemic heart disease and other diseases of the circulatory system: Secondary | ICD-10-CM

## 2018-02-18 DIAGNOSIS — E876 Hypokalemia: Secondary | ICD-10-CM | POA: Diagnosis present

## 2018-02-18 DIAGNOSIS — Z7902 Long term (current) use of antithrombotics/antiplatelets: Secondary | ICD-10-CM

## 2018-02-18 DIAGNOSIS — R7989 Other specified abnormal findings of blood chemistry: Secondary | ICD-10-CM

## 2018-02-18 DIAGNOSIS — I10 Essential (primary) hypertension: Secondary | ICD-10-CM | POA: Diagnosis present

## 2018-02-18 DIAGNOSIS — G20 Parkinson's disease: Secondary | ICD-10-CM | POA: Diagnosis present

## 2018-02-18 LAB — CBC WITH DIFFERENTIAL/PLATELET
ABS IMMATURE GRANULOCYTES: 0.06 10*3/uL (ref 0.00–0.07)
Basophils Absolute: 0 10*3/uL (ref 0.0–0.1)
Basophils Relative: 0 %
EOS ABS: 0 10*3/uL (ref 0.0–0.5)
Eosinophils Relative: 0 %
HEMATOCRIT: 39.3 % (ref 39.0–52.0)
HEMOGLOBIN: 13.2 g/dL (ref 13.0–17.0)
IMMATURE GRANULOCYTES: 0 %
LYMPHS ABS: 0.4 10*3/uL — AB (ref 0.7–4.0)
LYMPHS PCT: 3 %
MCH: 30.8 pg (ref 26.0–34.0)
MCHC: 33.6 g/dL (ref 30.0–36.0)
MCV: 91.6 fL (ref 80.0–100.0)
MONOS PCT: 8 %
Monocytes Absolute: 1.2 10*3/uL — ABNORMAL HIGH (ref 0.1–1.0)
NEUTROS ABS: 14 10*3/uL — AB (ref 1.7–7.7)
NEUTROS PCT: 89 %
Platelets: 222 10*3/uL (ref 150–400)
RBC: 4.29 MIL/uL (ref 4.22–5.81)
RDW: 13 % (ref 11.5–15.5)
WBC: 15.7 10*3/uL — ABNORMAL HIGH (ref 4.0–10.5)
nRBC: 0 % (ref 0.0–0.2)

## 2018-02-18 LAB — COMPREHENSIVE METABOLIC PANEL
ALT: 21 U/L (ref 0–44)
AST: 34 U/L (ref 15–41)
Albumin: 4 g/dL (ref 3.5–5.0)
Alkaline Phosphatase: 56 U/L (ref 38–126)
Anion gap: 12 (ref 5–15)
BUN: 18 mg/dL (ref 8–23)
CHLORIDE: 100 mmol/L (ref 98–111)
CO2: 26 mmol/L (ref 22–32)
CREATININE: 1.15 mg/dL (ref 0.61–1.24)
Calcium: 9 mg/dL (ref 8.9–10.3)
GFR, EST NON AFRICAN AMERICAN: 60 mL/min — AB (ref >60)
Glucose, Bld: 170 mg/dL — ABNORMAL HIGH (ref 70–99)
POTASSIUM: 2.9 mmol/L — AB (ref 3.5–5.1)
Sodium: 138 mmol/L (ref 135–145)
Total Bilirubin: 1.2 mg/dL (ref 0.3–1.2)
Total Protein: 7.4 g/dL (ref 6.5–8.1)

## 2018-02-18 LAB — URINALYSIS, COMPLETE (UACMP) WITH MICROSCOPIC
Bacteria, UA: NONE SEEN
Bilirubin Urine: NEGATIVE
GLUCOSE, UA: NEGATIVE mg/dL
KETONES UR: NEGATIVE mg/dL
LEUKOCYTES UA: NEGATIVE
NITRITE: NEGATIVE
PH: 6 (ref 5.0–8.0)
PROTEIN: NEGATIVE mg/dL
Specific Gravity, Urine: 1.014 (ref 1.005–1.030)

## 2018-02-18 LAB — INFLUENZA PANEL BY PCR (TYPE A & B)
Influenza A By PCR: NEGATIVE
Influenza B By PCR: NEGATIVE

## 2018-02-18 LAB — PROTIME-INR
INR: 1.05
Prothrombin Time: 13.6 seconds (ref 11.4–15.2)

## 2018-02-18 LAB — LACTIC ACID, PLASMA
LACTIC ACID, VENOUS: 3.3 mmol/L — AB (ref 0.5–1.9)
Lactic Acid, Venous: 1.3 mmol/L (ref 0.5–1.9)

## 2018-02-18 LAB — PROCALCITONIN: Procalcitonin: <0.1 ng/mL

## 2018-02-18 LAB — TROPONIN I: Troponin I: <0.03 ng/mL (ref <0.03)

## 2018-02-18 LAB — MAGNESIUM: Magnesium: 1.6 mg/dL — ABNORMAL LOW (ref 1.7–2.4)

## 2018-02-18 MED ORDER — SODIUM CHLORIDE 0.9% FLUSH
3.0000 mL | Freq: Two times a day (BID) | INTRAVENOUS | Status: DC
  Administered 2018-02-18 – 2018-02-21 (×6): 3 mL via INTRAVENOUS

## 2018-02-18 MED ORDER — VANCOMYCIN HCL 10 G IV SOLR
1500.0000 mg | Freq: Once | INTRAVENOUS | Status: AC
  Administered 2018-02-18: 1500 mg via INTRAVENOUS
  Filled 2018-02-18: qty 1500

## 2018-02-18 MED ORDER — BISACODYL 5 MG PO TBEC: 5.0000 mg | DELAYED_RELEASE_TABLET | Freq: Every day | ORAL | Status: DC | PRN

## 2018-02-18 MED ORDER — ALBUTEROL SULFATE (2.5 MG/3ML) 0.083% IN NEBU: 2.5000 mg | INHALATION_SOLUTION | RESPIRATORY_TRACT | Status: DC | PRN

## 2018-02-18 MED ORDER — ATORVASTATIN CALCIUM 20 MG PO TABS
80.0000 mg | ORAL_TABLET | Freq: Every day | ORAL | Status: DC
  Administered 2018-02-19 – 2018-02-20 (×2): 80 mg via ORAL
  Filled 2018-02-18: qty 4

## 2018-02-18 MED ORDER — ONDANSETRON HCL 4 MG PO TABS: 4.0000 mg | ORAL_TABLET | Freq: Four times a day (QID) | ORAL | Status: DC | PRN

## 2018-02-18 MED ORDER — GUAIFENESIN-DM 100-10 MG/5ML PO SYRP
5.0000 mL | ORAL_SOLUTION | ORAL | Status: DC | PRN
  Administered 2018-02-18 – 2018-02-20 (×3): 5 mL via ORAL
  Filled 2018-02-18 (×3): qty 5

## 2018-02-18 MED ORDER — SODIUM CHLORIDE 0.9 % IV BOLUS
1000.0000 mL | Freq: Once | INTRAVENOUS | Status: AC
Start: 2018-02-18 — End: 2018-02-18
  Administered 2018-02-18: 1000 mL via INTRAVENOUS

## 2018-02-18 MED ORDER — ASPIRIN EC 81 MG PO TBEC
81.0000 mg | DELAYED_RELEASE_TABLET | Freq: Every day | ORAL | Status: DC
  Administered 2018-02-19 – 2018-02-21 (×3): 81 mg via ORAL
  Filled 2018-02-18 (×3): qty 1

## 2018-02-18 MED ORDER — CLOPIDOGREL BISULFATE 75 MG PO TABS
75.0000 mg | ORAL_TABLET | Freq: Every day | ORAL | Status: DC
  Administered 2018-02-19 – 2018-02-21 (×3): 75 mg via ORAL
  Filled 2018-02-18 (×3): qty 1

## 2018-02-18 MED ORDER — SENNOSIDES-DOCUSATE SODIUM 8.6-50 MG PO TABS: 1.0000 | ORAL_TABLET | Freq: Every evening | ORAL | Status: DC | PRN

## 2018-02-18 MED ORDER — VANCOMYCIN HCL IN DEXTROSE 1-5 GM/200ML-% IV SOLN
1000.0000 mg | INTRAVENOUS | Status: DC
  Administered 2018-02-19: 1000 mg via INTRAVENOUS
  Filled 2018-02-18 (×2): qty 200

## 2018-02-18 MED ORDER — ACETAMINOPHEN 650 MG RE SUPP: 650.0000 mg | Freq: Four times a day (QID) | RECTAL | Status: DC | PRN

## 2018-02-18 MED ORDER — HYDROCODONE-ACETAMINOPHEN 5-325 MG PO TABS: 1.0000 | ORAL_TABLET | ORAL | Status: DC | PRN

## 2018-02-18 MED ORDER — ACETAMINOPHEN 325 MG PO TABS: 650.0000 mg | ORAL_TABLET | Freq: Four times a day (QID) | ORAL | Status: DC | PRN

## 2018-02-18 MED ORDER — ENOXAPARIN SODIUM 40 MG/0.4ML ~~LOC~~ SOLN
40.0000 mg | SUBCUTANEOUS | Status: DC
  Administered 2018-02-18 – 2018-02-20 (×3): 40 mg via SUBCUTANEOUS
  Filled 2018-02-18 (×3): qty 0.4

## 2018-02-18 MED ORDER — METRONIDAZOLE IN NACL 5-0.79 MG/ML-% IV SOLN
500.0000 mg | Freq: Three times a day (TID) | INTRAVENOUS | Status: DC
  Administered 2018-02-18 – 2018-02-20 (×5): 500 mg via INTRAVENOUS
  Filled 2018-02-18 (×8): qty 100

## 2018-02-18 MED ORDER — SODIUM CHLORIDE 0.9 % IV SOLN
2.0000 g | Freq: Once | INTRAVENOUS | Status: AC
  Administered 2018-02-18: 2 g via INTRAVENOUS
  Filled 2018-02-18: qty 2

## 2018-02-18 MED ORDER — SODIUM CHLORIDE 0.9 % IV SOLN: 250.0000 mL | INTRAVENOUS | Status: DC | PRN

## 2018-02-18 MED ORDER — SODIUM CHLORIDE 0.9 % IV BOLUS (SEPSIS)
1000.0000 mL | Freq: Once | INTRAVENOUS | Status: AC
  Administered 2018-02-18: 1000 mL via INTRAVENOUS

## 2018-02-18 MED ORDER — SODIUM CHLORIDE 0.9% FLUSH: 3.0000 mL | INTRAVENOUS | Status: DC | PRN

## 2018-02-18 MED ORDER — SODIUM CHLORIDE 0.9 % IV BOLUS (SEPSIS)
250.0000 mL | Freq: Once | INTRAVENOUS | Status: AC
  Administered 2018-02-18: 250 mL via INTRAVENOUS

## 2018-02-18 MED ORDER — VANCOMYCIN HCL IN DEXTROSE 1-5 GM/200ML-% IV SOLN: 1000.0000 mg | Freq: Once | INTRAVENOUS | Status: DC

## 2018-02-18 MED ORDER — POTASSIUM CHLORIDE CRYS ER 20 MEQ PO TBCR
20.0000 meq | EXTENDED_RELEASE_TABLET | Freq: Two times a day (BID) | ORAL | Status: DC
  Administered 2018-02-18: 22:00:00 20 meq via ORAL
  Filled 2018-02-18: qty 1

## 2018-02-18 MED ORDER — METRONIDAZOLE IN NACL 5-0.79 MG/ML-% IV SOLN
500.0000 mg | Freq: Three times a day (TID) | INTRAVENOUS | Status: DC
  Administered 2018-02-18: 500 mg via INTRAVENOUS
  Filled 2018-02-18 (×4): qty 100

## 2018-02-18 MED ORDER — SODIUM CHLORIDE 0.9 % IV SOLN
INTRAVENOUS | Status: DC
  Administered 2018-02-18 – 2018-02-20 (×3): via INTRAVENOUS

## 2018-02-18 MED ORDER — ONDANSETRON HCL 4 MG/2ML IJ SOLN: 4.0000 mg | Freq: Four times a day (QID) | INTRAMUSCULAR | Status: DC | PRN

## 2018-02-18 MED ORDER — TAMSULOSIN HCL 0.4 MG PO CAPS
0.4000 mg | ORAL_CAPSULE | Freq: Every day | ORAL | Status: DC
  Administered 2018-02-19 – 2018-02-21 (×3): 0.4 mg via ORAL
  Filled 2018-02-18 (×3): qty 1

## 2018-02-18 MED ORDER — SODIUM CHLORIDE 0.9 % IV SOLN
2.0000 g | Freq: Two times a day (BID) | INTRAVENOUS | Status: DC
  Administered 2018-02-19 – 2018-02-20 (×3): 2 g via INTRAVENOUS
  Filled 2018-02-18 (×6): qty 2

## 2018-02-18 NOTE — Progress Notes (Signed)
Advanced Care Plan.  Purpose of Encounter: CODE STATUS. Parties in Attendance: The patient, his wife and me. Patient's Decisional Capacity: Yes. Medical Story: Kevin Bradshaw  is a 83 y.o. male with a known history of hypertension, hyperlipidemia, pneumonia, Parkinson's disease.  The patient is being admitted for sepsis.  I discussed with the patient about his current condition, prognosis and CODE STATUS.  The patient wants to be resuscitated and intubated if he has cardiopulmonary arrest. Plan:  Code Status: Full code. Time spent discussing advance care planning: 17 minutes.

## 2018-02-18 NOTE — ED Notes (Signed)
Dr Derrill KayGoodman notified of critical lactic of 3.3 - no new orders at this time

## 2018-02-18 NOTE — ED Notes (Signed)
mya and Allayne Stack transporting pt

## 2018-02-18 NOTE — ED Notes (Signed)
Pt is aware that urine sample is needed. 

## 2018-02-18 NOTE — ED Notes (Signed)
Pt reports he is still unable to void and refuses in and out cath

## 2018-02-18 NOTE — ED Triage Notes (Addendum)
Pt arrived via ems for report of several falls this am d/t legs becoming weak - pt has had a non-productive cough x2 days and started with fever and decreased O2 sat 87% today - tylenol was given in route - heart rate goes from high 40's to 120 (no hx of afib)

## 2018-02-18 NOTE — Progress Notes (Signed)
Pharmacy Antibiotic Note  Kevin Bradshaw is a 83 y.o. male admitted on 02/18/2018. Pharmacy has been consulted for vancomycin and cefepime dosing for unknown source.  Plan: Vancomycin 1500 mg IV x 1. Vancomycin 1000 mg IV Q 24 hrs. Goal AUC 400-550. Expected AUC: 477.5 SCr used: 1.15  Cefepime 2 g IV q12h  Height: 5\' 6"  (167.6 cm) Weight: 150 lb (68 kg) IBW/kg (Calculated) : 63.8  Temp (24hrs), Avg:100 F (37.8 C), Min:100 F (37.8 C), Max:100 F (37.8 C)  Recent Labs  Lab 02/18/18 1059 02/18/18 1337  WBC 15.7*  --   CREATININE 1.15  --   LATICACIDVEN 3.3* 1.3    Estimated Creatinine Clearance: 46.2 mL/min (by C-G formula based on SCr of 1.15 mg/dL).    No Known Allergies  Antimicrobials this admission: Vancomycin 02/08 >>  Cefepime 02/08 >>   Dose adjustments this admission: NA  Microbiology results: 02/08 BCx: pending  Thank you for allowing pharmacy to be a part of this patient's care.  Pricilla Riffle, PharmD Pharmacy Resident  02/18/2018 2:35 PM

## 2018-02-18 NOTE — ED Notes (Signed)
Report finished att, calling for transport 

## 2018-02-18 NOTE — ED Notes (Signed)
Pt off bedpan, cleaned of stool, healing pressure ulcer (stage 2) noted in gluteal fold, chux and diaper placed

## 2018-02-18 NOTE — ED Notes (Signed)
ED TO INPATIENT HANDOFF REPORT  Name/Age/Gender Kevin Bradshaw 83 y.o. male  Code Status Code Status History    Date Active Date Inactive Code Status Order ID Comments User Context   08/21/2017 1301 08/25/2017 1820 Full Code [id number]  Houston SirenSainani, Vivek J, MD ED   01/06/2016 1752 01/08/2016 1456 Full Code [id number]  Hower, Cletis Athensavid K, MD ED      Home/SNF/Other Home  Chief Complaint general weakness  Level of Care/Admitting Diagnosis ED Disposition    ED Disposition Condition Comment   Admit  [facility]ospital [facility] [[id number]]  Level of Care: Med-Surg [16]  Diagnosis: Sepsis [facility](HCC) [[id number]][[id number]]  Admitting Physician: Shaune PollackHEN, QING [[id number]]  Attending Physician: Shaune PollackHEN, QING [phone][[id number]]  Estimated length of stay: past midnight tomorrow  Certification:: I certify this patient will need inpatient services for at least 2 midnights  PT Class (Do Not Modify): Inpatient [101]  PT Acc Code (Do Not Modify): Private [1]       Medical History Past Medical History:  Diagnosis Date  . Hyperlipidemia   . Hyperlipidemia   . Hypertension   . Parkinson disease (HCC)     Allergies No Known Allergies  IV Location/Drains/Wounds Patient Lines/Drains/Airways Status   Active Line/Drains/Airways    Name:   Placement date:   Placement time:   Site:   Days:   Peripheral IV 02/18/18 Right Hand   02/18/18    1057    Hand   less than 1   Peripheral IV 02/18/18 Left Forearm   02/18/18    1058    Forearm   less than 1          Labs/Imaging Results for orders placed or performed during the hospital encounter of 02/18/18 (from the past 48 hour(s))  Comprehensive metabolic panel     Status: Abnormal   Collection Time: 02/18/18 10:59 AM  Result Value Ref Range   Sodium 138 135 - 145 mmol/L   Potassium 2.9 (L) 3.5 - 5.1 mmol/L   Chloride 100 98 - 111 mmol/L   CO2 26 22 - 32 mmol/L   Glucose, Bld 170 (H) 70 - 99 mg/dL   BUN 18 8 - 23 mg/dL   Creatinine, Ser 2.951.15 0.61 - 1.24 mg/dL    Calcium 9.0 8.9 - 28.410.3 mg/dL   Total Protein 7.4 6.5 - 8.1 g/dL   Albumin 4.0 3.5 - 5.0 g/dL   AST 34 15 - 41 U/L   ALT 21 0 - 44 U/L   Alkaline Phosphatase 56 38 - 126 U/L   Total Bilirubin 1.2 0.3 - 1.2 mg/dL   GFR calc non Af Amer 60 (L) >60 mL/min   GFR calc Af Amer >60 >60 mL/min   Anion gap 12 5 - 15    Comment: Performed at [facility], [address] [place], [place] [address]  Lactic acid, plasma     Status: Abnormal   Collection Time: 02/18/18 10:59 AM  Result Value Ref Range   Lactic Acid, Venous 3.3 (HH) 0.5 - 1.9 mmol/L    Comment: CRITICAL RESULT CALLED TO, READ BACK BY AND VERIFIED WITH TERESA CLAPP AT 1147 ON 02/18/2018 [facility]. Performed at [facility], [address] [place], [place] [address]   CBC with Differential     Status: Abnormal   Collection Time: 02/18/18 10:59 AM  Result Value Ref Range   WBC 15.7 (H) 4.0 - 10.5 K/uL   RBC 4.29 4.22 - 5.81 MIL/uL   Hemoglobin 13.2 13.0 -  17.0 g/dL   HCT 81.2 75.1 - 70.0 %   MCV 91.6 80.0 - 100.0 fL   MCH 30.8 26.0 - 34.0 pg   MCHC 33.6 30.0 - 36.0 g/dL   RDW 17.4 94.4 - 96.7 %   Platelets 222 150 - 400 K/uL   nRBC 0.0 0.0 - 0.2 %   Neutrophils Relative % 89 %   Neutro Abs 14.0 (H) 1.7 - 7.7 K/uL   Lymphocytes Relative 3 %   Lymphs Abs 0.4 (L) 0.7 - 4.0 K/uL   Monocytes Relative 8 %   Monocytes Absolute 1.2 (H) 0.1 - 1.0 K/uL   Eosinophils Relative 0 %   Eosinophils Absolute 0.0 0.0 - 0.5 K/uL   Basophils Relative 0 %   Basophils Absolute 0.0 0.0 - 0.1 K/uL   Immature Granulocytes 0 %   Abs Immature Granulocytes 0.06 0.00 - 0.07 K/uL    Comment: Performed at [facility], [address] [place], [place] [address]  Protime-INR     Status: None   Collection Time: 02/18/18 10:59 AM  Result Value Ref Range   Prothrombin Time 13.6 11.4 - 15.2 seconds   INR 1.05     Comment: Performed at [facility], [address] [place], [place] [address]  Troponin I - Add-On to  previous collection     Status: None   Collection Time: 02/18/18 10:59 AM  Result Value Ref Range   Troponin I <0.03 <0.03 ng/mL    Comment: Performed at [facility], [address], [place], [place] [address]  Influenza panel by PCR (type A & B)     Status: None   Collection Time: 02/18/18 11:48 AM  Result Value Ref Range   Influenza A By PCR NEGATIVE NEGATIVE   Influenza B By PCR NEGATIVE NEGATIVE    Comment: (NOTE) The Xpert Xpress Flu assay is intended as an aid in the diagnosis of  influenza and should not be used as a sole basis for treatment.  This  assay is FDA approved for nasopharyngeal swab specimens only. Nasal  washings and aspirates are unacceptable for Xpert Xpress Flu testing. Performed at [facility], [address] [place], [place] [address]   Lactic acid, plasma     Status: None   Collection Time: 02/18/18  1:37 PM  Result Value Ref Range   Lactic Acid, Venous 1.3 0.5 - 1.9 mmol/L    Comment: Performed at [facility], [address] [place], [place] [address]  Urinalysis, Complete w Microscopic     Status: Abnormal   Collection Time: 02/18/18  3:12 PM  Result Value Ref Range   Color, Urine YELLOW (A) YELLOW   APPearance CLEAR (A) CLEAR   Specific Gravity, Urine 1.014 1.005 - 1.030   pH 6.0 5.0 - 8.0   Glucose, UA NEGATIVE NEGATIVE mg/dL   Hgb urine dipstick SMALL (A) NEGATIVE   Bilirubin Urine NEGATIVE NEGATIVE   Ketones, ur NEGATIVE NEGATIVE mg/dL   Protein, ur NEGATIVE NEGATIVE mg/dL   Nitrite NEGATIVE NEGATIVE   Leukocytes, UA NEGATIVE NEGATIVE   RBC / HPF 0-5 0 - 5 RBC/hpf   WBC, UA 0-5 0 - 5 WBC/hpf   Bacteria, UA NONE SEEN NONE SEEN   Squamous Epithelial / LPF 0-5 0 - 5   Mucus PRESENT     Comment: Performed at [facility], [address], [place], [place] [address]   Dg Chest Portable 1 View  Result Date: 02/18/2018 CLINICAL DATA:  Cough.  Fever.  Pain following recent fall EXAM: PORTABLE CHEST 1 VIEW  COMPARISON:  January 06, 2016 FINDINGS: There is no appreciable edema or consolidation. Heart is upper normal in size with pulmonary vascularity normal. No adenopathy. There is aortic atherosclerosis. IMPRESSION: No edema or consolidation.  There is aortic atherosclerosis. Electronically Signed   By: Bretta BangWilliam  Woodruff III M.D.   On: 02/18/2018 11:38    Pending Labs Unresulted Labs (From admission, onward)    Start     Ordered   02/18/18 1055  Culture, blood (Routine x 2)  BLOOD CULTURE X 2,   STAT     02/18/18 1054   Signed and Held  Basic metabolic panel  Tomorrow morning,   R     Signed and Held   Signed and Held  CBC  Tomorrow morning,   R     Signed and Held   Signed and Held  Creatinine, serum  (enoxaparin (LOVENOX)    CrCl >/= 30 ml/min)  Weekly,   R    Comments:  while on enoxaparin therapy    Signed and Held   Signed and Held  Procalcitonin  ONCE - STAT,   R     Signed and Held   Signed and Held  Magnesium  Add-on,   R     Signed and Held          Vitals/Pain Today's Vitals   02/18/18 1820 02/18/18 1821 02/18/18 1908 02/18/18 1934  BP: 103/82   136/84  Pulse: (!) 108   (!) 52  Resp:    19  Temp:      TempSrc:      SpO2: 97%   96%  Weight:      Height:      PainSc:  0-No pain 0-No pain 0-No pain    Isolation Precautions Droplet precaution  Medications Medications  metroNIDAZOLE (FLAGYL) IVPB 500 mg (0 mg Intravenous Stopped 02/18/18 1822)  potassium chloride SA (K-DUR,KLOR-CON) CR tablet 20 mEq (has no administration in time range)  vancomycin (VANCOCIN) IVPB 1000 mg/200 mL premix (has no administration in time range)  ceFEPIme (MAXIPIME) 2 g in sodium chloride 0.9 % 100 mL IVPB (has no administration in time range)  sodium chloride 0.9 % bolus 1,000 mL (0 mLs Intravenous Stopped 02/18/18 1335)  sodium chloride 0.9 % bolus 1,000 mL (0 mLs Intravenous Stopped 02/18/18 1432)    And  sodium chloride 0.9 % bolus 250 mL (0 mLs Intravenous Stopped 02/18/18 1553)   ceFEPIme (MAXIPIME) 2 g in sodium chloride 0.9 % 100 mL IVPB (0 g Intravenous Stopped 02/18/18 1432)  vancomycin (VANCOCIN) 1,500 mg in sodium chloride 0.9 % 500 mL IVPB (0 mg Intravenous Stopped 02/18/18 1822)    Mobility walks with device

## 2018-02-18 NOTE — ED Notes (Signed)
Pt is eating food with family.

## 2018-02-18 NOTE — H&P (Signed)
[employer] - [place] at [facility]   PATIENT NAME: Kevin Bradshaw    MR#:  [id number]  DATE OF BIRTH:  1935-02-22  DATE OF ADMISSION:  02/18/2018  PRIMARY CARE PHYSICIAN: Gracelyn Nurse, MD   REQUESTING/REFERRING PHYSICIAN: Dr. Derrill Kay.  CHIEF COMPLAINT:   Chief Complaint  Patient presents with  . Fall  . Cough   Fall, cough, generalized weakness. HISTORY OF PRESENT ILLNESS:  Kevin Bradshaw  is a 83 y.o. male with a known history of hypertension, hyperlipidemia, pneumonia, Parkinson's disease.  The patient is sent to ED due to above chief complaints.  The patient fell twice this morning.  He has generalized weakness and cough but denies any fever or chills, no shortness of breath or palpitation or leg swelling.  He is found sepsis with tachycardia, leukocytosis and lactic acidosis.  Chest x-ray is unremarkable.  Influenza test is negative.  Urinalysis is pending.  The patient denies any dysuria or hematuria.  He is treated with antibiotics and Dr. Derrill Kay requested admission for sepsis.  PAST MEDICAL HISTORY:   Past Medical History:  Diagnosis Date  . Hyperlipidemia   . Hyperlipidemia   . Hypertension   . Parkinson disease (HCC)     PAST SURGICAL HISTORY:   Past Surgical History:  Procedure Laterality Date  . CARDIOVASCULAR STRESS TEST     normal  . CARPAL TUNNEL RELEASE  2009   right hand  . X-STOP IMPLANTATION    . X-STOP IMPLANTATION     Dr. Gerrit Heck    SOCIAL HISTORY:   Social History   Tobacco Use  . Smoking status: Former Smoker    Packs/day: 1.50    Years: 50.00    Pack years: 75.00    Types: Cigarettes    Last attempt to quit: 11/04/2002    Years since quitting: 15.3  . Smokeless tobacco: Never Used  Substance Use Topics  . Alcohol use: Yes    Comment: once a week    FAMILY HISTORY:   Family History  Problem Relation Age of Onset  . Heart disease Father 38  . Cancer Sister        ? type  . Heart disease Brother   . Cancer  Brother        ? type  . Stroke Child   . Seizures Child     DRUG ALLERGIES:  No Known Allergies  REVIEW OF SYSTEMS:   Review of Systems  Constitutional: Positive for malaise/fatigue. Negative for chills and fever.  HENT: Negative for sore throat.   Eyes: Negative for blurred vision and double vision.  Respiratory: Positive for cough. Negative for hemoptysis, shortness of breath, wheezing and stridor.   Cardiovascular: Negative for chest pain, palpitations, orthopnea and leg swelling.  Gastrointestinal: Negative for abdominal pain, blood in stool, diarrhea, melena, nausea and vomiting.  Genitourinary: Negative for dysuria, flank pain and hematuria.  Musculoskeletal: Negative for back pain and joint pain.  Neurological: Negative for dizziness, sensory change, focal weakness, seizures, loss of consciousness, weakness and headaches.  Endo/Heme/Allergies: Negative for polydipsia.  Psychiatric/Behavioral: Negative for depression. The patient is not nervous/anxious.     MEDICATIONS AT HOME:   Prior to Admission medications   Medication Sig Start Date End Date Taking? Authorizing Provider  amLODipine (NORVASC) 5 MG tablet Take 5 mg by mouth daily.   Yes [provider]  aspirin 81 MG tablet Take 81 mg by mouth daily.     Yes [provider]  atorvastatin (LIPITOR)  80 MG tablet Take 1 tablet (80 mg total) by mouth daily at 6 PM. 08/23/17  Yes Shaune Pollackhen, Quantina Dershem, MD  clopidogrel (PLAVIX) 75 MG tablet Take 1 tablet (75 mg total) by mouth daily. 08/23/17  Yes Shaune Pollackhen, Shayda Kalka, MD  hydrochlorothiazide (HYDRODIURIL) 25 MG tablet Take 25 mg by mouth daily.   Yes [provider]  KLOR-CON M20 20 MEQ tablet Take 20 mEq by mouth 2 (two) times daily. 06/15/17  Yes [provider]  tamsulosin (FLOMAX) 0.4 MG CAPS capsule Take 0.4 mg by mouth daily. 09/19/17 09/19/18 Yes [provider]      VITAL SIGNS:  Blood pressure 124/76, pulse (!) 113, temperature 100 F (37.8 C),  temperature source Oral, height 5\' 6"  (1.676 m), weight 68 kg, SpO2 95 %.  PHYSICAL EXAMINATION:  Physical Exam  GENERAL:  83 y.o.-year-old patient lying in the bed with no acute distress.  EYES: Pupils equal, round, reactive to light and accommodation. No scleral icterus. Extraocular muscles intact.  HEENT: Head atraumatic, normocephalic. Oropharynx and nasopharynx clear.  NECK:  Supple, no jugular venous distention. No thyroid enlargement, no tenderness.  LUNGS: Normal breath sounds bilaterally, no wheezing, rales,rhonchi or crepitation. No use of accessory muscles of respiration.  CARDIOVASCULAR: S1, S2 normal. No murmurs, rubs, or gallops.  ABDOMEN: Soft, nontender, nondistended. Bowel sounds present. No organomegaly or mass.  EXTREMITIES: No pedal edema, cyanosis, or clubbing.  NEUROLOGIC: Cranial nerves II through XII are intact. Muscle strength 5/5 in all extremities. Sensation intact. Gait not checked.  PSYCHIATRIC: The patient is alert and oriented x 3.  SKIN: No obvious rash, lesion, or ulcer.   LABORATORY PANEL:   CBC Recent Labs  Lab 02/18/18 1059  WBC 15.7*  HGB 13.2  HCT 39.3  PLT 222   ------------------------------------------------------------------------------------------------------------------  Chemistries  Recent Labs  Lab 02/18/18 1059  NA 138  K 2.9*  CL 100  CO2 26  GLUCOSE 170*  BUN 18  CREATININE 1.15  CALCIUM 9.0  AST 34  ALT 21  ALKPHOS 56  BILITOT 1.2   ------------------------------------------------------------------------------------------------------------------  Cardiac Enzymes Recent Labs  Lab 02/18/18 1059  TROPONINI <0.03   ------------------------------------------------------------------------------------------------------------------  RADIOLOGY:  Dg Chest Portable 1 View  Result Date: 02/18/2018 CLINICAL DATA:  Cough.  Fever.  Pain following recent fall EXAM: PORTABLE CHEST 1 VIEW COMPARISON:  January 06, 2016  FINDINGS: There is no appreciable edema or consolidation. Heart is upper normal in size with pulmonary vascularity normal. No adenopathy. There is aortic atherosclerosis. IMPRESSION: No edema or consolidation.  There is aortic atherosclerosis. Electronically Signed   By: Bretta BangWilliam  Woodruff III M.D.   On: 02/18/2018 11:38      IMPRESSION AND PLAN:   Sepsis, unclear source. The patient will be admitted to medical floor. Continue cefepime, vancomycin and Flagyl IV, IV fluid support, follow-up CBC and cultures.  Follow-up urinalysis.  Lactic acidosis.  Due to above, follow-up lactic acid level.  Hypokalemia.  Potassium supplement.  Hold HCTZ.  Hypertension.  Hold Norvasc and HCTZ due to normal blood pressure and sepsis.  All the records are reviewed and case discussed with ED provider. Management plans discussed with the patient, his wife and they are in agreement.  CODE STATUS: Full code.  TOTAL TIME TAKING CARE OF THIS PATIENT: 33 minutes.    Shaune PollackQing Bardia Wangerin M.D on 02/18/2018 at 2:02 PM  Between 7am to 6pm - Pager - [phone]  After 6pm go to [email or web address] - Therapist, nutritionalpassword [email or web address]  [employer] [place] [facility]  Office  [phone]  CC: Primary care physician; Gracelyn NurseJohnston, John D, MD   Note: This dictation was prepared with Dragon dictation along with smaller phrase technology. Any transcriptional errors that result from this process are unin

## 2018-02-18 NOTE — ED Notes (Signed)
Pt requested to go to toilet - he is unable to stand without falling backwards and he is unable to take a step - his legs become shaky and he falls back on bed - offered urinal and bedpan but pt declined

## 2018-02-18 NOTE — ED Provider Notes (Signed)
[facility] Emergency Department Provider Note   ____________________________________________   I have reviewed the triage vital signs and the nursing notes.   HISTORY  Chief Complaint Fall and Cough   History limited by: Not Limited   HPI Kevin Bradshaw is a 83 y.o. male who presents to the emergency department today because of concerns for falls.  He states he had 2 falls this morning.  He normally ambulates with a walker but felt weaker than normal.  He could not walk with his walker.  He denies any injuries from his falls.  He has been dealing with a head cold the past couple of days.  States he has had cough.  Denies any shortness of breath but EMS found to be hypoxic on room air.  Patient has not felt like he has had any fevers although EMS measured a fever.  Patient has had similar symptoms in the past and has had pneumonia in the past.   Per medical record review patient has a history of community-acquired pneumonia, hypertension, hyperlipidemia  Past Medical History:  Diagnosis Date  . Hyperlipidemia   . Hyperlipidemia   . Hypertension   . Parkinson disease [facility])     Patient Active Problem List   Diagnosis Date Noted  . Acute CVA (cerebrovascular accident) (HCC) 08/22/2017  . Right leg weakness 08/21/2017  . 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 CAP (community acquired pneumonia) 01/06/2016  . Productive cough 11/09/2010  . Hypertension 11/04/2010  . Hyperlipidemia 11/04/2010    Past Surgical History:  Procedure Laterality Date  . CARDIOVASCULAR STRESS TEST     normal  . CARPAL TUNNEL RELEASE  2009   right hand  . X-STOP IMPLANTATION    . X-STOP IMPLANTATION     Dr. Gerrit Heck    Prior to Admission medications   Medication Sig Start Date End Date Taking? Authorizing Provider  amLODipine (NORVASC) 5 MG tablet Take 5 mg by mouth daily.    [provider]  aspirin 81 MG tablet Take 81 mg by mouth daily.      [provider]  atorvastatin (LIPITOR) 80 MG tablet Take 1 tablet (80 mg total) by mouth daily at 6 PM. 08/23/17   Shaune Pollack, MD  clopidogrel (PLAVIX) 75 MG tablet Take 1 tablet (75 mg total) by mouth daily. 08/23/17   Shaune Pollack, MD  hydrochlorothiazide (HYDRODIURIL) 25 MG tablet Take 25 mg by mouth daily.    [provider]  KLOR-CON M20 20 MEQ tablet Take 20 mEq by mouth 2 (two) times daily. 06/15/17   [provider]    Allergies Patient has no known allergies.  Family History  Problem Relation Age of Onset  . Heart disease Father 1  . Cancer Sister        ? type  . Heart disease Brother   . Cancer Brother        ? type  . Stroke Child   . Seizures Child     Social History Social History   Tobacco Use  . Smoking status: Former Smoker    Packs/day: 1.50    Years: 50.00    Pack years: 75.00    Types: Cigarettes    Last attempt to quit: 11/04/2002    Years since quitting: 15.3  . Smokeless tobacco: Never Used  Substance Use Topics  . Alcohol use: Yes    Comment: once a week  . Drug use: No    Review of Systems Constitutional: No  fever/chills.  Positive for generalized weakness Eyes: No visual changes. ENT: Positive for congestion Cardiovascular: Denies chest pain. Respiratory: Denies shortness of breath.  Positive for cough Gastrointestinal: No abdominal pain.  No nausea, no vomiting.  No diarrhea.   Genitourinary: Negative for dysuria. Musculoskeletal: Negative for back pain. Skin: Negative for rash. Neurological: Negative for headaches, focal weakness or numbness.  ____________________________________________   PHYSICAL EXAM:  VITAL SIGNS: ED Triage Vitals  Enc Vitals Group     BP 02/18/18 1050 124/76     Pulse Rate 02/18/18 1050 (!) 113     Resp --      Temp 02/18/18 1050 100 F (37.8 C)     Temp Source 02/18/18 1050 Oral     SpO2 02/18/18 1048 95 %     Weight 02/18/18 1052 150 lb (68 kg)     Height 02/18/18 1052 5\' 6"  (1.676 m)      Head Circumference --      Peak Flow --      Pain Score 02/18/18 1052 0   Constitutional: Alert and oriented.  Eyes: Conjunctivae are normal.  ENT      Head: Normocephalic and atraumatic.      Nose: No congestion/rhinnorhea.      Mouth/Throat: Mucous membranes are moist.      Neck: No stridor. Hematological/Lymphatic/Immunilogical: No cervical lymphadenopathy. Cardiovascular: Tachycardic, irregular rhythm.  No murmurs, rubs, or gallops.  Respiratory: Normal respiratory effort without tachypnea nor retractions. Breath sounds are clear and equal bilaterally. No wheezes/rales/rhonchi. Gastrointestinal: Soft and non tender. No rebound. No guarding.  Genitourinary: Deferred Musculoskeletal: Normal range of motion in all extremities. No lower extremity edema. Neurologic:  Normal speech and language. No gross focal neurologic deficits are appreciated.  Skin:  Skin is warm, dry and intact. No rash noted. Psychiatric: Mood and affect are normal. Speech and behavior are normal. Patient exhibits appropriate insight and judgment.  ____________________________________________    LABS (pertinent positives/negatives)  CMP k 2.9, glu 170 otherwise wnl Influenza negative Lactic 3.3 Trop <0.03 CBC wbc 15.7, hgb 13.2, plt 222  ____________________________________________   EKG  I, Phineas SemenGraydon Hudsyn Barich, attending physician, personally viewed and interpreted this EKG  EKG Time: 1055 Rate: 121 Rhythm: sinus tachycardia with arrythmia vs afib Axis: normal Intervals: qtc 467 QRS: LPFB ST changes: no st elevation Impression: abnormal ekg   ____________________________________________    RADIOLOGY  CXR No acute disease  ____________________________________________   PROCEDURES  Procedures  CRITICAL CARE Performed by: Phineas SemenGraydon Maniyah Moller   Total critical care time: 35 minutes  Critical care time was exclusive of separately billable procedures and treating other  patients.  Critical care was necessary to treat or prevent imminent or life-threatening deterioration.  Critical care was time spent personally by me on the following activities: development of treatment plan with patient and/or surrogate as well as nursing, discussions with consultants, evaluation of patient's response to treatment, examination of patient, obtaining history from patient or surrogate, ordering and performing treatments and interventions, ordering and review of laboratory studies, ordering and review of radiographic studies, pulse oximetry and re-evaluation of patient's condition.  ____________________________________________   INITIAL IMPRESSION / ASSESSMENT AND PLAN / ED COURSE  Pertinent labs & imaging results that were available during my care of the patient were reviewed by me and considered in my medical decision making (see chart for details).   Patient presented to the emergency department today with complaints of generalized weakness and a couple of falls.  Differential would be broad including anemia, infection,  electrolyte abnormality amongst other etiologies.  Did have greatest concern for possible infection given fever and tachycardia.  Chest x-ray however without pneumonia.  Influenza was checked and this was negative.  Lactic acid level and white count and blood were both elevated.  To have concern for infection with unknown source at this time.  Patient will be given broad-spectrum antibiotics.  Patient will be given IV fluid boluses.  Discussed findings and plan of admission with patient.  ____________________________________________   FINAL CLINICAL IMPRESSION(S) / ED DIAGNOSES  Final diagnoses:  Sepsis, due to unspecified organism, unspecified whether acute organ dysfunction present (HCC)  Cough  Weakness  Elevated lactic acid level     Note: This dictation was prepared with Dragon dictation. Any transcriptional errors that result from this process are  unintentional     Phineas Semen, MD 02/18/18 1352

## 2018-02-19 ENCOUNTER — Other Ambulatory Visit: Payer: Self-pay

## 2018-02-19 LAB — BLOOD CULTURE ID PANEL (REFLEXED)

## 2018-02-19 LAB — BASIC METABOLIC PANEL
Anion gap: 8 (ref 5–15)
BUN: 12 mg/dL (ref 8–23)
CO2: 27 mmol/L (ref 22–32)
Calcium: 8.2 mg/dL — ABNORMAL LOW (ref 8.9–10.3)
Chloride: 105 mmol/L (ref 98–111)
Creatinine, Ser: 0.88 mg/dL (ref 0.61–1.24)
GFR calc Af Amer: >60 mL/min (ref >60)
GFR calc non Af Amer: >60 mL/min (ref >60)
Glucose, Bld: 105 mg/dL — ABNORMAL HIGH (ref 70–99)
Potassium: 2.8 mmol/L — ABNORMAL LOW (ref 3.5–5.1)
Sodium: 140 mmol/L (ref 135–145)

## 2018-02-19 LAB — EXPECTORATED SPUTUM ASSESSMENT W GRAM STAIN, RFLX TO RESP C

## 2018-02-19 LAB — CBC
HCT: 34.5 % — ABNORMAL LOW (ref 39.0–52.0)
Hemoglobin: 11.5 g/dL — ABNORMAL LOW (ref 13.0–17.0)
MCH: 30.9 pg (ref 26.0–34.0)
MCHC: 33.3 g/dL (ref 30.0–36.0)
MCV: 92.7 fL (ref 80.0–100.0)
Platelets: 183 10*3/uL (ref 150–400)
RBC: 3.72 MIL/uL — ABNORMAL LOW (ref 4.22–5.81)
RDW: 13.2 % (ref 11.5–15.5)
WBC: 10.9 10*3/uL — ABNORMAL HIGH (ref 4.0–10.5)
nRBC: 0 % (ref 0.0–0.2)

## 2018-02-19 MED ORDER — MAGNESIUM SULFATE 2 GM/50ML IV SOLN
2.0000 g | Freq: Once | INTRAVENOUS | Status: AC
  Administered 2018-02-19: 14:00:00 2 g via INTRAVENOUS
  Filled 2018-02-19: qty 50

## 2018-02-19 MED ORDER — POTASSIUM CHLORIDE CRYS ER 20 MEQ PO TBCR
40.0000 meq | EXTENDED_RELEASE_TABLET | Freq: Two times a day (BID) | ORAL | Status: DC
  Administered 2018-02-19 – 2018-02-21 (×5): 40 meq via ORAL
  Filled 2018-02-19 (×5): qty 2

## 2018-02-19 NOTE — Clinical Social Work Note (Signed)
CSW received consult for possible SNF placement. PT evaluation is pending; CSW will follow if PT recommends SNF.  Argentina Ponder, MSW, Theresia Majors [phone]

## 2018-02-19 NOTE — Plan of Care (Signed)

## 2018-02-19 NOTE — Progress Notes (Signed)
[place] at [place] NAME: Kevin Bradshaw    MR#:  [id number]  DATE OF BIRTH:  05/22/1935  SUBJECTIVE:  CHIEF COMPLAINT: Patient reports he is feeling little better today but extremely weak agreeable with the PT consult.  Reporting productive cough with whitish phlegm  REVIEW OF SYSTEMS:  CONSTITUTIONAL: No fever, fatigue or weakness.  EYES: No blurred or double vision.  EARS, NOSE, AND THROAT: No tinnitus or ear pain.  RESPIRATORY: No cough, shortness of breath, wheezing or hemoptysis.  CARDIOVASCULAR: No chest pain, orthopnea, edema.  GASTROINTESTINAL: No nausea, vomiting, diarrhea or abdominal pain.  GENITOURINARY: No dysuria, hematuria.  ENDOCRINE: No polyuria, nocturia,  HEMATOLOGY: No anemia, easy bruising or bleeding SKIN: No rash or lesion. MUSCULOSKELETAL: No joint pain or arthritis.   NEUROLOGIC: No tingling, numbness, weakness.  PSYCHIATRY: No anxiety or depression.   DRUG ALLERGIES:  No Known Allergies  VITALS:  Blood pressure 112/79, pulse (!) 107, temperature 98.6 F (37 C), temperature source Oral, resp. rate 18, height 5' 6"  (1.676 m), weight 68 kg, SpO2 93 %.  PHYSICAL EXAMINATION:  GENERAL:  83 y.o.-year-old patient lying in the bed with no acute distress.  EYES: Pupils equal, round, reactive to light and accommodation. No scleral icterus. Extraocular muscles intact.  HEENT: Head atraumatic, normocephalic. Oropharynx and nasopharynx clear.  NECK:  Supple, no jugular venous distention. No thyroid enlargement, no tenderness.  LUNGS: Moderate breath sounds bilaterally, no wheezing, rales,rhonchi or crepitation. No use of accessory muscles of respiration.  CARDIOVASCULAR: S1, S2 normal. No murmurs, rubs, or gallops.  ABDOMEN: Soft, nontender, nondistended. Bowel sounds present. No organomegaly or mass.  EXTREMITIES: No pedal edema, cyanosis, or clubbing.  NEUROLOGIC: Awake alert and oriented x3. Sensation intact. Gait  not checked.  PSYCHIATRIC: The patient is alert and oriented x 3.  SKIN: No obvious rash, lesion, or ulcer.    LABORATORY PANEL:   CBC Recent Labs  Lab 02/19/18 0401  WBC 10.9*  HGB 11.5*  HCT 34.5*  PLT 183   ------------------------------------------------------------------------------------------------------------------  Chemistries  Recent Labs  Lab 02/18/18 1059 02/19/18 0401  NA 138 140  K 2.9* 2.8*  CL 100 105  CO2 26 27  GLUCOSE 170* 105*  BUN 18 12  CREATININE 1.15 0.88  CALCIUM 9.0 8.2*  MG 1.6*  --   AST 34  --   ALT 21  --   ALKPHOS 56  --   BILITOT 1.2  --    ------------------------------------------------------------------------------------------------------------------  Cardiac Enzymes Recent Labs  Lab 02/18/18 1059  TROPONINI <0.03   ------------------------------------------------------------------------------------------------------------------  RADIOLOGY:  Dg Chest Portable 1 View  Result Date: 02/18/2018 CLINICAL DATA:  Cough.  Fever.  Pain following recent fall EXAM: PORTABLE CHEST 1 VIEW COMPARISON:  January 06, 2016 FINDINGS: There is no appreciable edema or consolidation. Heart is upper normal in size with pulmonary vascularity normal. No adenopathy. There is aortic atherosclerosis. IMPRESSION: No edema or consolidation.  There is aortic atherosclerosis. Electronically Signed   By: Lowella Grip III M.D.   On: 02/18/2018 11:38    EKG:   Orders placed or performed during the hospital encounter of 02/18/18  . EKG 12-Lead  . EKG 12-Lead  . EKG 12-Lead  . EKG 12-Lead    ASSESSMENT AND PLAN:   Sepsis, unclear source. Clinically feeling better Met septic criteria with the lactic acidosis, leukocytosis at the time of admission We will get sputum culture and sensitivity Continue cefepime, vancomycin and Flagyl IV, IV fluid  support Blood cultures negative so far Influenza test negative Urinalysis negative  Lactic acidosis.   Due to above, follow-up lactic acid level.  Hypokalemia and hypomagnesemia -provide potassium, magnesium supplement.  Hold HCTZ.  Hypertension.  Hold Norvasc and HCTZ due to normal blood pressure and sepsis  Generalized weakness PT assessment     All the records are reviewed and case discussed with Care Management/Social Workerr. Management plans discussed with the patient, family and they are in agreement.  CODE STATUS:  fc   TOTAL TIME TAKING CARE OF THIS PATIENT: 36  minutes.   POSSIBLE D/C IN 2  DAYS, DEPENDING ON CLINICAL CONDITION.  Note: This dictation was prepared with Dragon dictation along with smaller phrase technology. Any transcriptional errors that result from this process are unintentional.   [name] on 02/19/2018 at 1:22 PM  Between 7am to 6pm - Pager - [phone] After 6pm go to [email or web address] - password [email or web address] [place] [facility]  Office  [phone]  CC: Primary care physician; [name], MD

## 2018-02-19 NOTE — Progress Notes (Signed)
   02/19/18 1300  Clinical Encounter Type  Visited With Patient and family together  Visit Type Initial;Spiritual support  Referral From Nurse  Spiritual Encounters  Spiritual Needs Emotional;Grief support;Other (Comment)  Stress Factors  Patient Stress Factors Loss;Other (Comment)  Family Stress Factors Loss  Advance Directives (For Healthcare)  Does Patient Have a Medical Advance Directive? No  Would patient like information on creating a medical advance directive? Yes (Inpatient - patient requests chaplain consult to create a medical advance directive)  Mental Health Advance Directives  Does Patient Have a Mental Health Advance Directive? No  Would patient like information on creating a mental health advance directive? No - Patient declined

## 2018-02-19 NOTE — Progress Notes (Signed)
PHARMACY - PHYSICIAN COMMUNICATION CRITICAL VALUE ALERT - BLOOD CULTURE IDENTIFICATION (BCID)  Kevin Bradshaw is an 83 y.o. male who [facility] with a chief complaint of sepsis  Assessment:  1 out of 4 bottle positive for CoNS.  (include suspected source if known)  Name of physician (or Provider) Contacted: Dr. Imogene Burnhen and Dr. Amado CoeGouru   Current antibiotics: cefepime and vancomycin  Changes to prescribed antibiotics recommended:  Patient is on recommended antibiotics - No changes needed. Source not known. Recommend continuing and narrow when possible.   Results for orders placed or performed during the hospital encounter of 02/18/18  Blood Culture ID Panel (Reflexed) (Collected: 02/18/2018  1:38 PM)  Result Value Ref Range   Enterococcus species NOT DETECTED NOT DETECTED   Listeria monocytogenes NOT DETECTED NOT DETECTED   Staphylococcus species DETECTED (A) NOT DETECTED   Staphylococcus aureus (BCID) NOT DETECTED NOT DETECTED   Methicillin resistance NOT DETECTED NOT DETECTED   Streptococcus species NOT DETECTED NOT DETECTED   Streptococcus agalactiae NOT DETECTED NOT DETECTED   Streptococcus pneumoniae NOT DETECTED NOT DETECTED   Streptococcus pyogenes NOT DETECTED NOT DETECTED   Acinetobacter baumannii NOT DETECTED NOT DETECTED   Enterobacteriaceae species NOT DETECTED NOT DETECTED   Enterobacter cloacae complex NOT DETECTED NOT DETECTED   Escherichia coli NOT DETECTED NOT DETECTED   Klebsiella oxytoca NOT DETECTED NOT DETECTED   Klebsiella pneumoniae NOT DETECTED NOT DETECTED   Proteus species NOT DETECTED NOT DETECTED   Serratia marcescens NOT DETECTED NOT DETECTED   Haemophilus influenzae NOT DETECTED NOT DETECTED   Neisseria meningitidis NOT DETECTED NOT DETECTED   Pseudomonas aeruginosa NOT DETECTED NOT DETECTED   Candida albicans NOT DETECTED NOT DETECTED   Candida glabrata NOT DETECTED NOT DETECTED   Candida krusei NOT DETECTED NOT DETECTED   Candida parapsilosis NOT DETECTED NOT DETECTED   Candida tropicalis NOT DETECTED NOT DETECTED    Ronnald RampKishan S Tawanna Funk, PharmD, BCPS Clinical Pharmacist  02/19/2018  3:02 PM

## 2018-02-19 NOTE — Progress Notes (Signed)
PT Cancellation Note  Patient Details Name: Kevin Bradshaw MRN: [id number] DOB: 02-Aug-1935   Cancelled Treatment:    Reason Eval/Treat Not Completed: Patient's level of consciousness. PT entered the room and patient was fast asleep with the television on and lights on. PT deferred treatment this date and will re-attempt at a later date.   Alva Garnet PT, DPT, CSCS    02/19/2018, 3:33 PM

## 2018-02-20 LAB — CBC
HCT: 36.5 % — ABNORMAL LOW (ref 39.0–52.0)
Hemoglobin: 12.1 g/dL — ABNORMAL LOW (ref 13.0–17.0)
MCH: 30.5 pg (ref 26.0–34.0)
MCHC: 33.2 g/dL (ref 30.0–36.0)
MCV: 91.9 fL (ref 80.0–100.0)
Platelets: 190 10*3/uL (ref 150–400)
RBC: 3.97 MIL/uL — ABNORMAL LOW (ref 4.22–5.81)
RDW: 13.2 % (ref 11.5–15.5)
WBC: 8.2 10*3/uL (ref 4.0–10.5)
nRBC: 0 % (ref 0.0–0.2)

## 2018-02-20 LAB — BASIC METABOLIC PANEL
Anion gap: 5 (ref 5–15)
BUN: 12 mg/dL (ref 8–23)
CO2: 27 mmol/L (ref 22–32)
CREATININE: 0.93 mg/dL (ref 0.61–1.24)
Calcium: 8.1 mg/dL — ABNORMAL LOW (ref 8.9–10.3)
Chloride: 106 mmol/L (ref 98–111)
GFR calc non Af Amer: >60 mL/min (ref >60)
Glucose, Bld: 109 mg/dL — ABNORMAL HIGH (ref 70–99)
Potassium: 3.3 mmol/L — ABNORMAL LOW (ref 3.5–5.1)
Sodium: 138 mmol/L (ref 135–145)

## 2018-02-20 LAB — MAGNESIUM: Magnesium: 1.7 mg/dL (ref 1.7–2.4)

## 2018-02-20 MED ORDER — CEFDINIR 300 MG PO CAPS
300.0000 mg | ORAL_CAPSULE | Freq: Two times a day (BID) | ORAL | Status: DC
  Administered 2018-02-20 – 2018-02-21 (×3): 300 mg via ORAL
  Filled 2018-02-20 (×4): qty 1

## 2018-02-20 MED ORDER — MAGNESIUM SULFATE 2 GM/50ML IV SOLN
2.0000 g | Freq: Once | INTRAVENOUS | Status: AC
  Administered 2018-02-20: 16:00:00 2 g via INTRAVENOUS
  Filled 2018-02-20: qty 50

## 2018-02-20 MED ORDER — POTASSIUM CHLORIDE CRYS ER 20 MEQ PO TBCR: 40.0000 meq | EXTENDED_RELEASE_TABLET | Freq: Once | ORAL | Status: DC

## 2018-02-20 NOTE — Progress Notes (Signed)
[facility] NAME: Kevin Bradshaw    MR#:  [id number]  DATE OF BIRTH:  09-05-1935  SUBJECTIVE:  CHIEF COMPLAINT: Patient reports he is feeling weak and agreeable to go to rehab center as recommended by physical therapy.  Daughter at bedside  REVIEW OF SYSTEMS:  CONSTITUTIONAL: No fever, fatigue or weakness.  EYES: No blurred or double vision.  EARS, NOSE, AND THROAT: No tinnitus or ear pain.  RESPIRATORY: Reports cough, denies shortness of breath, wheezing or hemoptysis.  CARDIOVASCULAR: No chest pain, orthopnea, edema.  GASTROINTESTINAL: No nausea, vomiting, diarrhea or abdominal pain.  GENITOURINARY: No dysuria, hematuria.  ENDOCRINE: No polyuria, nocturia,  HEMATOLOGY: No anemia, easy bruising or bleeding SKIN: No rash or lesion. MUSCULOSKELETAL: No joint pain or arthritis.   NEUROLOGIC: No tingling, numbness, weakness.  PSYCHIATRY: No anxiety or depression.   DRUG ALLERGIES:  No Known Allergies  VITALS:  Blood pressure 127/85, pulse (!) 103, temperature 98 F (36.7 C), temperature source Oral, resp. rate 18, height 5' 6"  (1.676 m), weight 68 kg, SpO2 90 %.  PHYSICAL EXAMINATION:  GENERAL:  83 y.o.-year-old patient lying in the bed with no acute distress.  EYES: Pupils equal, round, reactive to light and accommodation. No scleral icterus. Extraocular muscles intact.  HEENT: Head atraumatic, normocephalic. Oropharynx and nasopharynx clear.  NECK:  Supple, no jugular venous distention. No thyroid enlargement, no tenderness.  LUNGS: Moderate breath sounds bilaterally, no wheezing, rales,rhonchi or crepitation. No use of accessory muscles of respiration.  CARDIOVASCULAR: S1, S2 normal. No murmurs, rubs, or gallops.  ABDOMEN: Soft, nontender, nondistended. Bowel sounds present. No organomegaly or mass.  EXTREMITIES: No pedal edema, cyanosis, or clubbing.  NEUROLOGIC: Awake alert and oriented x3. Sensation intact. Gait not  checked.  PSYCHIATRIC: The patient is alert and oriented x 3.  SKIN: No obvious rash, lesion, or ulcer.    LABORATORY PANEL:   CBC Recent Labs  Lab 02/20/18 0354  WBC 8.2  HGB 12.1*  HCT 36.5*  PLT 190   ------------------------------------------------------------------------------------------------------------------  Chemistries  Recent Labs  Lab 02/18/18 1059  02/20/18 0354  NA 138   < > 138  K 2.9*   < > 3.3*  CL 100   < > 106  CO2 26   < > 27  GLUCOSE 170*   < > 109*  BUN 18   < > 12  CREATININE 1.15   < > 0.93  CALCIUM 9.0   < > 8.1*  MG 1.6*  --  1.7  AST 34  --   --   ALT 21  --   --   ALKPHOS 56  --   --   BILITOT 1.2  --   --    < > = values in this interval not displayed.   ------------------------------------------------------------------------------------------------------------------  Cardiac Enzymes Recent Labs  Lab 02/18/18 1059  TROPONINI <0.03   ------------------------------------------------------------------------------------------------------------------  RADIOLOGY:  No results found.  EKG:   Orders placed or performed during the hospital encounter of 02/18/18  . EKG 12-Lead  . EKG 12-Lead  . EKG 12-Lead  . EKG 12-Lead    ASSESSMENT AND PLAN:   Sepsis-probably viral etiology Clinically feeling better Met septic criteria with the lactic acidosis, leukocytosis at the time of admission sputum culture and sensitivity-no organisms seen Discontinued cefepime, vancomycin and Flagyl IV  IV fluid support Blood cultures negative so far Influenza test negative Urinalysis negative   Lactic acidosis.  Due to above, follow-up lactic  acid level.  Hypokalemia and hypomagnesemia -potassium at 3.3 and magnesium at 1.7 provide potassium, magnesium supplements  Hold HCTZ.  Hypertension.  Hold Norvasc and HCTZ due to normal blood pressure and sepsis  Generalized weakness PT assessment-recommending skilled nursing facility patient and  her daughter at bedside are agreeable to go to rehab center     All the records are reviewed and case discussed with Care Management/Social Workerr. Management plans discussed with the patient, family and they are in agreement.  CODE STATUS:  fc   TOTAL TIME TAKING CARE OF THIS PATIENT: 36  minutes.   POSSIBLE D/C IN 2  DAYS, DEPENDING ON CLINICAL CONDITION.  Note: This dictation was prepared with Dragon dictation along with smaller phrase technology. Any transcriptional errors that result from this process are unintentional.   [name] on 02/20/2018 at 2:40 PM  Between 7am to 6pm - Pager - [phone] After 6pm go to [email or web address] - password [email or web address] [facility]  Office  [phone]  CC: Primary care physician; [name], MD

## 2018-02-20 NOTE — Clinical Social Work Placement (Signed)
   CLINICAL SOCIAL WORK PLACEMENT  NOTE  Date:  02/20/2018  Patient Details  Name: Gabrial Schellin MRN: [id number] Date of Birth: [birth year]  Clinical Social Work is seeking post-discharge placement for this patient at the Skilled  Nursing Facility level of care (*CSW will initial, date and re-position this form in  chart as items are completed):  Yes   Patient/family provided with [facility] Clinical Social Work Department's list of facilities offering this level of care within the geographic area requested by the patient (or if unable, by the patient's family).  Yes   Patient/family informed of their freedom to choose among providers that offer the needed level of care, that participate in Medicare, Medicaid or managed care program needed by the patient, have an available bed and are willing to accept the patient.  Yes   Patient/family informed of [facility]'s ownership interest in [facility] and [facility], as well as of the fact that they are under no obligation to receive care at these facilities.  PASRR submitted to EDS on 02/20/18     PASRR number received on       Existing PASRR number confirmed on 02/20/18     FL2 transmitted to all facilities in geographic area requested by pt/family on 02/20/18     FL2 transmitted to all facilities within larger geographic area on       Patient informed that his/her managed care company has contracts with or will negotiate with certain facilities, including the following:            Patient/family informed of bed offers received.  Patient chooses bed at       Physician recommends and patient chooses bed at      Patient to be transferred to   on  .  Patient to be transferred to facility by       Patient family notified on   of transfer.  Name of family member notified:        PHYSICIAN       Additional Comment:    _______________________________________________ Ruthe Mannan, LCSWA 02/20/2018, 4:49 PM

## 2018-02-20 NOTE — [place] FL2 (Signed)
[place] MEDICAID FL2 LEVEL OF CARE SCREENING TOOL     IDENTIFICATION  Patient Name: Kevin Bradshaw Birthdate: 05/21/1935 Sex: male Admission Date (Current Location): 02/18/2018  [place]   Facility and Address:  [facility], [address], [place], [place] [address]      Provider Number: [id number]  Attending Physician Name and Address:  Ramonita LabGouru, Aruna, MD  Relative Name and Phone Number:       Current Level of Care: Hospital Recommended Level of Care: Skilled Nursing Facility Prior Approval Number:    Date Approved/Denied:   PASRR Number: [id number]  Discharge Plan: SNF    Current Diagnoses: Patient Active Problem List   Diagnosis Date Noted  . Sepsis (HCC) 02/18/2018  . Acute CVA (cerebrovascular accident) (HCC) 08/22/2017  . Right leg weakness 08/21/2017  . 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 CAP (community acquired pneumonia) 01/06/2016  . Productive cough 11/09/2010  . Hypertension 11/04/2010  . Hyperlipidemia 11/04/2010    Orientation RESPIRATION BLADDER Height & Weight     Self, Time, Place  Normal Continent Weight: 150 lb (68 kg) Height:  5\' 6"  (167.6 cm)  BEHAVIORAL SYMPTOMS/MOOD NEUROLOGICAL BOWEL NUTRITION STATUS  (none) (none) Continent Diet(Heart Healthy )  AMBULATORY STATUS COMMUNICATION OF NEEDS Skin   Extensive Assist Verbally Normal                       Personal Care Assistance Level of Assistance  Bathing, Feeding, Dressing Bathing Assistance: Limited assistance Feeding assistance: Independent Dressing Assistance: Limited assistance     Functional Limitations Info  Sight, Hearing, Speech Sight Info: Adequate(Wears glasses ) Hearing Info: Adequate Speech Info: Adequate    SPECIAL CARE FACTORS FREQUENCY  PT (By licensed PT), OT (By licensed OT)     PT Frequency: 5 OT Frequency: 5            Contractures Contractures Info: Not present    Additional  Factors Info  Code Status, Allergies Code Status Info: Full Code  Allergies Info: NKA           Current Medications (02/20/2018):  This is the current hospital active medication list Current Facility-Administered Medications  Medication Dose Route Frequency Provider Last Rate Last Dose  . 0.9 %  sodium chloride infusion  250 mL Intravenous PRN Shaune Pollackhen, Qing, MD      . acetaminophen (TYLENOL) tablet 650 mg  650 mg Oral Q6H PRN Shaune Pollackhen, Qing, MD       Or  . acetaminophen (TYLENOL) suppository 650 mg  650 mg Rectal Q6H PRN Shaune Pollackhen, Qing, MD      . albuterol (PROVENTIL) (2.5 MG/3ML) 0.083% nebulizer solution 2.5 mg  2.5 mg Nebulization Q2H PRN Shaune Pollackhen, Qing, MD      . aspirin EC tablet 81 mg  81 mg Oral Daily Shaune Pollackhen, Qing, MD   81 mg at 02/20/18 [id number]  . atorvastatin (LIPITOR) tablet 80 mg  80 mg Oral q1800 Shaune Pollackhen, Qing, MD   80 mg at 02/19/18 1530  . bisacodyl (DULCOLAX) EC tablet 5 mg  5 mg Oral Daily PRN Shaune Pollackhen, Qing, MD      . cefdinir (OMNICEF) capsule 300 mg  300 mg Oral Q12H Gouru, Aruna, MD   300 mg at 02/20/18 1600  . clopidogrel (PLAVIX) tablet 75 mg  75 mg Oral Daily Shaune Pollackhen, Qing, MD   75 mg at 02/20/18 [id number]  . enoxaparin (LOVENOX) injection 40 mg  40 mg Subcutaneous Q24H Shaune Pollackhen, Qing,  MD   40 mg at 02/19/18 2129  . guaiFENesin-dextromethorphan (ROBITUSSIN DM) 100-10 MG/5ML syrup 5 mL  5 mL Oral Q4H PRN Shaune Pollackhen, Qing, MD   5 mL at 02/20/18 0152  . HYDROcodone-acetaminophen (NORCO/VICODIN) 5-325 MG per tablet 1 tablet  1 tablet Oral Q4H PRN Shaune Pollackhen, Qing, MD      . magnesium sulfate IVPB 2 g 50 mL  2 g Intravenous Once Gouru, Aruna, MD 50 mL/hr at 02/20/18 1606 2 g at 02/20/18 1606  . ondansetron (ZOFRAN) tablet 4 mg  4 mg Oral Q6H PRN Shaune Pollackhen, Qing, MD       Or  . ondansetron [facility](ZOFRAN) injection 4 mg  4 mg Intravenous Q6H PRN Shaune Pollackhen, Qing, MD      . potassium chloride SA (K-DUR,KLOR-CON) CR tablet 40 mEq  40 mEq Oral BID Arnaldo Nataliamond, Michael S, MD   40 mEq at 02/20/18 0837  . senna-docusate (Senokot-S) tablet 1 tablet   1 tablet Oral QHS PRN Shaune Pollackhen, Qing, MD      . sodium chloride flush (NS) 0.9 % injection 3 mL  3 mL Intravenous Q12H Shaune Pollackhen, Qing, MD   3 mL at 02/20/18 1607  . sodium chloride flush (NS) 0.9 % injection 3 mL  3 mL Intravenous PRN Shaune Pollackhen, Qing, MD      . tamsulosin [facility](FLOMAX) capsule 0.4 mg  0.4 mg Oral Daily Shaune Pollackhen, Qing, MD   0.4 mg at 02/20/18 [id number]     Discharge Medications: Please see discharge summary for a list of discharge medications.  Relevant Imaging Results:  Relevant Lab Results:   Additional Information SSN: [id number]  Ruthe Mannanandace  [place], [place]

## 2018-02-20 NOTE — Progress Notes (Signed)
PT Evaluation Note  Assessment: [name] is admitted for the above and presents with the deficits listed below (see PT problem list). Pt lives with daughter and son in law in 1 story home with 1 step to enter. Pt's daughter and son in law are able to provide care intermittently and daughter reports pt is never alone for more than 2-3 hours. PTA pt was independent with dressing and bathing, but required assistance for cooking, cleaning, and driving. Per pt daughter pt had 1 fall 2 months ago. Pt required Max A x2 to go from supine to sitting at EOB, once sitting at EOB pt did not require physical assist. Min A x2 was provided for sit>stand and Min A x1 was provided for stand>sit. Pt required Min-Mod A x 1-2 for ambulation to maintain balance. With static and dynamic activity pt demonstrated right posterior lean, verbal, tactile, and visual cues provided to maintain upright posture. Verbal, tactile, and visual cues provided for pt to take larger steps as pt demonstrated festinating gait pattern R>L. Multiple breaks were instructed via SPT to instruct pt on safe RW management as pt tended to let RW too far in front of him. Upon discharge pt is recommending SNF due to assistance required for all aspects of mobility.       02/20/18 1100  PT Visit Information  Last PT Received On 02/20/18  Assistance Needed +2  History of Present Illness Pt is a 83 y.o male who presented to the ED following two falls. Upon admittance pt diagnosed with sepsis of unclear origin. Sepsis being treated with antibiotics and IV fluids. Pt has PMH of Parkinson's and HTN.   Precautions  Precautions Fall  Restrictions  Weight Bearing Restrictions No  Home Living  Family/patient expects to be discharged to: Private residence  Living Arrangements Children  Available Help at Discharge Family;Available PRN/intermittently  Type of Home House  Home Access Stairs to enter  Entrance Stairs-Number of Steps 1  Entrance Stairs-Rails None   Home Layout One level  Bathroom Nurse, children'shower/Tub Tub/shower unit  Bathroom Toilet Standard  Bathroom Accessibility Yes  Home Equipment Walker - standard;Wheelchair - manual;Grab bars - tub/shower;Shower seat  Additional Comments Per pt daughter - pt is never alone for more than 2-3 hours, daughter works til 6 and daughter's husband frequently leaves at varying times.   Prior Function  Level of Independence Needs assistance  Gait / Transfers Assistance Needed Per pt daughter pt was house hold ambulator with standard walker. Pt not driving and had 1 other fall 2 months ago.   ADL's / Homemaking Assistance Needed Per pt daughter pt was independent with dressing, toileting, and bathing. Pt required assistance for cooking and cleaning.  Communication  Communication No difficulties  Pain Assessment  Pain Assessment No/denies pain  Cognition  Arousal/Alertness Awake/alert  Behavior During Therapy WFL for tasks assessed/performed  Overall Cognitive Status Within Functional Limits for tasks assessed  Upper Extremity Assessment  Upper Extremity Assessment Overall WFL for tasks assessed  Lower Extremity Assessment  Lower Extremity Assessment RLE deficits/detail;LLE deficits/detail  RLE Deficits / Details Pt was able to perform SLR x10 without physical assist. Of note RLE difficult to advance forward with gait- festinating pattern.  LLE Deficits / Details Pt was able to perform SLR x10 without physical assist. Pt able to advance LLE more easier than RLE with gait- festinating pattern.  Cervical / Trunk Assessment  Cervical / Trunk Assessment Kyphotic  Bed Mobility  Overal bed mobility Needs Assistance  Bed Mobility  Supine to Sit  Supine to sit Max assist;+2 for physical assistance;HOB elevated  General bed mobility comments Pt was able to move BLE towards EOB but required Max A x2 to get to EOB for LE and trunk support. In sitting pt did not require any physical assist.  Transfers  Overall transfer  level Needs assistance  Equipment used Rolling walker (2 wheeled)  Transfers Sit to/from Stand  Sit to Stand +2 physical assistance;Min assist  General transfer comment Min A x2 was provided for patient to perform sit>stand. Min A x1 provided for stand>sit. VC provided for hand placement on RW. Right posterior lean noted in static standing. Verbal, tactile, and visual cues provided.  Ambulation/Gait  Ambulation/Gait assistance Mod assist;+2 physical assistance  Gait Distance (Feet) 4 Feet  Assistive device Rolling walker (2 wheeled)  Gait Pattern/deviations Decreased step length - right;Decreased step length - left;Decreased weight shift to left;Festinating;Shuffle;Leaning posteriorly;Staggering right;Trunk flexed  General Gait Details Pt required Mod A x1-2 to maintain standing balance, pt right posteriorly lean with dynamic movement. Verbal, tactile, and visual cues provided to maintain upright posture as well as take steps. Pt with festinating pattern R>L with minmal foot clearance BLE.   Gait velocity decreased   Balance  Overall balance assessment Needs assistance  Sitting-balance support Feet supported;No upper extremity supported  Sitting balance-Leahy Scale Fair  Sitting balance - Comments Pt does not require BUE support to maintain sitting balance. If challenged suspected pt would lose balance. SPT min guard for safety.  Standing balance support Bilateral upper extremity supported;During functional activity  Standing balance-Leahy Scale Poor  Standing balance comment Pt required BUE of RW to maintain support with static and dynamic activity. SPT Mod A for static standing.   General Comments  General comments (skin integrity, edema, etc.) Pt demonstrated poor RW management, VC provided.   Exercises  Exercises General Lower Extremity;Other exercises  Total Joint Exercises  Ankle Circles/Pumps AROM;Both;10 reps;Supine  General Exercises - Lower Extremity  Straight Leg Raises  Strengthening;Both;10 reps;Supine  Other Exercises  Other Exercises Pt instructed in alternating seated marching without UE support x8 each leg.   Other Exercises Pt instructed in standing alternating marching with BUE support of RW - pt unable to copmlete.   PT - End of Session  Equipment Utilized During Treatment Gait belt  Activity Tolerance Patient limited by fatigue;Other (comment) (limited to posterior lean)  Patient left in chair;with call bell/phone within reach;with chair alarm set;with family/visitor present  Nurse Communication Mobility status  PT Assessment  PT Recommendation/Assessment Patient needs continued PT services  PT Visit Diagnosis Unsteadiness on feet (R26.81);Other abnormalities of gait and mobility (R26.89);Repeated falls (R29.6);Muscle weakness (generalized) (M62.81);History of falling (Z91.81)  PT Problem List Decreased strength;Decreased range of motion;Decreased activity tolerance;Decreased balance;Decreased mobility;Decreased coordination;Decreased knowledge of use of DME;Decreased safety awareness  Barriers to Discharge Decreased caregiver support  Barriers to Discharge Comments Pt daughter and daughter's husband available to provide intermittent care.   PT Plan  PT Frequency (ACUTE ONLY) Min 2X/week  PT Treatment/Interventions (ACUTE ONLY) DME instruction;Gait training;Stair training;Functional mobility training;Therapeutic activities;Therapeutic exercise;Balance training;Neuromuscular re-education;Patient/family education;Wheelchair mobility training  AM-PAC PT "6 Clicks" Mobility Outcome Measure (Version 2)  Help needed turning from your back to your side while in a flat bed without using bedrails? 2  Help needed moving from lying on your back to sitting on the side of a flat bed without using bedrails? 2  Help needed moving to and from a bed to a chair (including a wheelchair)? 2  Help needed standing up from a chair using your arms (e.g., wheelchair or  bedside chair)? 2  Help needed to walk in hospital room? 2  Help needed climbing 3-5 steps with a railing?  1  6 Click Score 11  Consider Recommendation of Discharge To: CIR/SNF/LTACH  PT Recommendation  Follow Up Recommendations SNF  PT equipment Other (comment);Rolling walker with 5" wheels (pt will likely need RW, TBD at next venue )  Individuals Consulted  Consulted and Agree with Results and Recommendations Patient;Family member/caregiver  Family Member Consulted Daughter  Acute Rehab PT Goals  Patient Stated Goal To return home   PT Goal Formulation With patient  Time For Goal Achievement 03/06/18  Potential to Achieve Goals Fair  PT Time Calculation  PT Start Time (ACUTE ONLY) 1045  PT Stop Time (ACUTE ONLY) 1128  PT Time Calculation (min) (ACUTE ONLY) 43 min  PT General Charges  $$ ACUTE PT VISIT 1 Visit  PT Evaluation  $PT Eval Low Complexity 1 Low  PT Treatments  $Gait Training 8-22 mins  $Therapeutic Activity 8-22 mins   Emeline Gins, SPT

## 2018-02-20 NOTE — Plan of Care (Signed)
  Problem: Education: Goal: Knowledge of General Education information will improve Description Including pain rating scale, medication(s)/side effects and non-pharmacologic comfort measures Outcome: Progressing   Problem: Health Behavior/Discharge Planning: Goal: Ability to manage health-related needs will improve Outcome: Progressing   Problem: Clinical Measurements: Goal: Ability to maintain clinical measurements within normal limits will improve Outcome: Progressing Goal: Will remain free from infection Outcome: Progressing Goal: Diagnostic test results will improve Outcome: Progressing Goal: Respiratory complications will improve Outcome: Progressing Goal: Cardiovascular complication will be avoided Outcome: Progressing   Problem: Activity: Goal: Risk for activity intolerance will decrease Outcome: Progressing   Problem: Nutrition: Goal: Adequate nutrition will be maintained Outcome: Progressing   Problem: Coping: Goal: Level of anxiety will decrease Outcome: Progressing   Problem: Elimination: Goal: Will not experience complications related to bowel motility Outcome: Progressing Goal: Will not experience complications related to urinary retention Outcome: Progressing   Problem: Pain Managment: Goal: General experience of comfort will improve Outcome: Progressing   Problem: Skin Integrity: Goal: Risk for impaired skin integrity will decrease Outcome: Progressing   Problem: Spiritual Needs Goal: Ability to function at adequate level Outcome: Progressing   

## 2018-02-20 NOTE — Clinical Social Work Note (Signed)
Clinical Social Work Assessment  Patient Details  Name: Kevin Bradshaw MRN: [id number] Date of Birth: 1935/10/17  Date of referral:  02/20/18               Reason for consult:  Facility Placement                Permission sought to share information with:  Case Manager, Customer service manager, Family Supports Permission granted to share information::  Yes, Verbal Permission Granted  Name::      SNF  Agency::   [place]   Relationship::     Contact Information:     Housing/Transportation Living arrangements for the past 2 months:  [place] of Information:  Patient Patient Interpreter Needed:  None Criminal Activity/Legal Involvement Pertinent to Current Situation/Hospitalization:  No - Comment as needed Significant Relationships:  Adult Children Lives with:  Adult Children Do you feel safe going back to the place where you live?  Yes Need for family participation in patient care:  Yes (Comment)  Care giving concerns:  Patient lives with daughter    Social Worker assessment / plan:  CSW consulted for SNF placement. CSW met with patient to discuss discharge plan. Patient states that he lives with his daughter. CSW explained PT recommendation of SNF. Patient is in agreement with SNF placement and preference is [facility]. CSW explained that [facility] is usually full but would check to see if anything is available. CSW initiated bed search and will give bed offers once received. CSW will continue to follow for discharge planning.   Employment status:  Retired Forensic scientist:  [employer] PT Recommendations:  [place] / Referral to community resources:  [place]  Patient/Family's Response to care:  Patient thanked CSW for assistance   Patient/Family's Understanding of and Emotional Response to Diagnosis, Current Treatment, and Prognosis:  Patient accepting of discharge plan   Emotional  Assessment Appearance:  Appears stated age Attitude/Demeanor/Rapport:    Affect (typically observed):  Accepting, Pleasant Orientation:  Oriented to Self, Oriented to Place, Oriented to  Time Alcohol / Substance use:  Not Applicable Psych involvement (Current and /or in the community):  No (Comment)  Discharge Needs  Concerns to be addressed:  Discharge Planning Concerns Readmission within the last 30 days:  No Current discharge risk:  Dependent with Mobility Barriers to Discharge:  Continued Medical Work up   [employer], [place] 02/20/2018, 4:36 PM

## 2018-02-21 LAB — CULTURE, BLOOD (ROUTINE X 2)

## 2018-02-21 LAB — POTASSIUM: Potassium: 4.2 mmol/L (ref 3.5–5.1)

## 2018-02-21 LAB — MAGNESIUM: Magnesium: 2 mg/dL (ref 1.7–2.4)

## 2018-02-21 MED ORDER — ONDANSETRON HCL 4 MG PO TABS: 4.0000 mg | ORAL_TABLET | Freq: Four times a day (QID) | ORAL | 0 refills | Status: DC | PRN

## 2018-02-21 MED ORDER — ALBUTEROL SULFATE HFA 108 (90 BASE) MCG/ACT IN AERS: 2.0000 | INHALATION_SPRAY | Freq: Four times a day (QID) | RESPIRATORY_TRACT | 2 refills | Status: DC | PRN

## 2018-02-21 MED ORDER — BISACODYL 5 MG PO TBEC: 5.0000 mg | DELAYED_RELEASE_TABLET | Freq: Every day | ORAL | 0 refills | Status: DC | PRN

## 2018-02-21 MED ORDER — CEFDINIR 300 MG PO CAPS: 300.0000 mg | ORAL_CAPSULE | Freq: Two times a day (BID) | ORAL | 0 refills | Status: DC

## 2018-02-21 MED ORDER — TRAMADOL HCL 50 MG PO TABS
50.0000 mg | ORAL_TABLET | Freq: Two times a day (BID) | ORAL | Status: DC
  Administered 2018-02-21: 50 mg via ORAL
  Filled 2018-02-21: qty 1

## 2018-02-21 MED ORDER — ACETAMINOPHEN 325 MG PO TABS: 650.0000 mg | ORAL_TABLET | Freq: Four times a day (QID) | ORAL | Status: DC | PRN

## 2018-02-21 MED ORDER — GUAIFENESIN-DM 100-10 MG/5ML PO SYRP: 5.0000 mL | ORAL_SOLUTION | ORAL | 0 refills | Status: DC | PRN

## 2018-02-21 MED ORDER — TRAMADOL HCL 50 MG PO TABS: 50.0000 mg | ORAL_TABLET | Freq: Two times a day (BID) | ORAL | 0 refills | Status: DC

## 2018-02-21 NOTE — Care Management Important Message (Signed)
Important Message  Patient Details  Name: Normon Radford MRN: [id number] Date of Birth: 1935/07/09   Medicare Important Message Given:  Yes    Olegario Messier A Syliva Mee 02/21/2018, 10:55 AM

## 2018-02-21 NOTE — Clinical Social Work Note (Signed)
CSW spoke with patient's daughter Franky Macho [phone] and gave bed offers. Daughter chose [facility]. CSW notified Verlon Au at [facility] of bed acceptance and request for private room. CSW will continue to follow for discharge planning.   Ruthe Mannan MSW, [address] [phone]

## 2018-02-21 NOTE — Discharge Summary (Signed)
[place] [facility] [place] NAME: Kevin Bradshaw    MR#:  [id number]  DATE OF BIRTH:  1935/09/02  DATE OF ADMISSION:  02/18/2018 ADMITTING PHYSICIAN: Demetrios Loll, MD  DATE OF DISCHARGE:  02/21/18   PRIMARY CARE PHYSICIAN: [name], MD    ADMISSION DIAGNOSIS:  Cough [R05] Weakness [R53.1] Elevated lactic acid level [R79.89] Sepsis, due to unspecified organism, unspecified whether acute organ dysfunction present ([place]) [A41.9]  DISCHARGE DIAGNOSIS:  Active Problems:   Sepsis ([place])   SECONDARY DIAGNOSIS:   Past Medical History:  Diagnosis Date  . Hyperlipidemia   . Hyperlipidemia   . Hypertension   . Parkinson disease [facility])     HOSPITAL COURSE:  HPI  Kevin Bradshaw  is a 83 y.o. male with a known history of hypertension, hyperlipidemia, pneumonia, Parkinson's disease.  The patient is sent to ED due to above chief complaints.  The patient fell twice this morning.  He has generalized weakness and cough but denies any fever or chills, no shortness of breath or palpitation or leg swelling.  He is found sepsis with tachycardia, leukocytosis and lactic acidosis.  Chest x-ray is unremarkable.  Influenza test is negative.  Urinalysis is pending.  The patient denies any dysuria or hematuria.  He is treated with antibiotics and Dr. Archie Balboa requested admission for sepsis.  Sepsis-probably viral etiology Clinically feeling better Met septic criteria with the lactic acidosis, leukocytosis at the time of admission sputum culture and sensitivity-no organisms seen Discontinued cefepime, vancomycin and Flagyl IV.  Omnicef twice a day for 3 more days for acute bronchitis with antitussives and bronchodilator treatments as needed  IV fluid support provided during the hospital course Blood cultures negative so far Influenza test negative Urinalysis negative   Lacticacidosis. Due to above, follow-up lactic acid level.  Hypokalemia and  hypomagnesemia-potassium at 3.3 and magnesium at 1.7 provide potassium, magnesium supplementsHold HCTZ.  Hypertension. Hold Norvasc and HCTZdue to low- normal blood pressure and sepsis  Generalized weakness PT assessment-recommending skilled nursing facility patient and her daughter at bedside are agreeable to go to rehab center DISCHARGE CONDITIONS:   fair  CONSULTS OBTAINED:     PROCEDURES  None   DRUG ALLERGIES:  No Known Allergies  DISCHARGE MEDICATIONS:   Allergies as of 02/21/2018   No Known Allergies     Medication List    STOP taking these medications   amLODipine 5 MG tablet Commonly known as:  NORVASC   hydrochlorothiazide 25 MG tablet Commonly known as:  HYDRODIURIL     TAKE these medications   acetaminophen 325 MG tablet Commonly known as:  TYLENOL Take 2 tablets (650 mg total) by mouth every 6 (six) hours as needed for mild pain (or Fever >/= 101).   albuterol 108 (90 Base) MCG/ACT inhaler Commonly known as:  PROVENTIL HFA;VENTOLIN HFA Inhale 2 puffs into the lungs every 6 (six) hours as needed for wheezing or shortness of breath.   aspirin 81 MG tablet Take 81 mg by mouth daily.   atorvastatin 80 MG tablet Commonly known as:  LIPITOR Take 1 tablet (80 mg total) by mouth daily at 6 PM.   bisacodyl 5 MG EC tablet Commonly known as:  DULCOLAX Take 1 tablet (5 mg total) by mouth daily as needed for moderate constipation.   cefdinir 300 MG capsule Commonly known as:  OMNICEF Take 1 capsule (300 mg total) by mouth every 12 (twelve) hours.   clopidogrel 75 MG tablet Commonly known  as:  PLAVIX Take 1 tablet (75 mg total) by mouth daily.   guaiFENesin-dextromethorphan 100-10 MG/5ML syrup Commonly known as:  ROBITUSSIN DM Take 5 mLs by mouth every 4 (four) hours as needed for cough.   KLOR-CON M20 20 MEQ tablet Generic drug:  potassium chloride SA Take 20 mEq by mouth 2 (two) times daily.   ondansetron 4 MG tablet Commonly known as:   ZOFRAN Take 1 tablet (4 mg total) by mouth every 6 (six) hours as needed for nausea.   tamsulosin 0.4 MG Caps capsule Commonly known as:  FLOMAX Take 0.4 mg by mouth daily.   traMADol 50 MG tablet Commonly known as:  ULTRAM Take 1 tablet (50 mg total) by mouth every 12 (twelve) hours.        DISCHARGE INSTRUCTIONS:   Follow-up with primary care physician at the facility in 3 to 4 days  DIET:  Low-salt diet  DISCHARGE CONDITION:  Fair  ACTIVITY:  Activity as tolerated per PT  OXYGEN:  Home Oxygen: No.   Oxygen Delivery: room air  DISCHARGE LOCATION:  nursing home   If you experience worsening of your admission symptoms, develop shortness of breath, life threatening emergency, suicidal or homicidal thoughts you must seek medical attention immediately by calling 911 or calling your MD immediately  if symptoms less severe.  You Must read complete instructions/literature along with all the possible adverse reactions/side effects for all the Medicines you take and that have been prescribed to you. Take any new Medicines after you have completely understood and accpet all the possible adverse reactions/side effects.   Please note  You were cared for by a hospitalist during your hospital stay. If you have any questions about your discharge medications or the care you received while you were in the hospital after you are discharged, you can call the unit and asked to speak with the hospitalist on call if the hospitalist that took care of you is not available. Once you are discharged, your primary care physician will handle any further medical issues. Please note that NO REFILLS for any discharge medications will be authorized once you are discharged, as it is imperative that you return to your primary care physician (or establish a relationship with a primary care physician if you do not have one) for your aftercare needs so that they can reassess your need for medications and  monitor your lab values.     Today  Chief Complaint  Patient presents with  . Fall  . Cough   Patient is feeling fine other than generalized weakness.  Agreeable to go to rehab center today  ROS:  CONSTITUTIONAL: Denies fevers, chills. Denies any fatigue, weakness.  EYES: Denies blurry vision, double vision, eye pain. EARS, NOSE, THROAT: Denies tinnitus, ear pain, hearing loss. RESPIRATORY: Denies cough, wheeze, shortness of breath.  CARDIOVASCULAR: Denies chest pain, palpitations, edema.  GASTROINTESTINAL: Denies nausea, vomiting, diarrhea, abdominal pain. Denies bright red blood per rectum. GENITOURINARY: Denies dysuria, hematuria. ENDOCRINE: Denies nocturia or thyroid problems. HEMATOLOGIC AND LYMPHATIC: Denies easy bruising or bleeding. SKIN: Denies rash or lesion. MUSCULOSKELETAL: Denies pain in neck, back, shoulder, knees, hips or arthritic symptoms.  NEUROLOGIC: Denies paralysis, paresthesias.  PSYCHIATRIC: Denies anxiety or depressive symptoms.   VITAL SIGNS:  Blood pressure 121/81, pulse 78, temperature 98.2 F (36.8 C), temperature source Oral, resp. rate 18, height _0  (1.676 m), weight 68 kg, SpO2 93 %.  I/O:    Intake/Output Summary (Last 24 hours) at 02/21/2018 1237 Last data  filed at 02/21/2018 0500 Gross per 24 hour  Intake 341.31 ml  Output 800 ml  Net -458.69 ml    PHYSICAL EXAMINATION:  GENERAL:  83 y.o.-year-old patient lying in the bed with no acute distress.  EYES: Pupils equal, round, reactive to light and accommodation. No scleral icterus. Extraocular muscles intact.  HEENT: Head atraumatic, normocephalic. Oropharynx and nasopharynx clear.  NECK:  Supple, no jugular venous distention. No thyroid enlargement, no tenderness.  LUNGS: Normal breath sounds bilaterally, no wheezing, rales,rhonchi or crepitation. No use of accessory muscles of respiration.  CARDIOVASCULAR: S1, S2 normal. No murmurs, rubs, or gallops.  ABDOMEN: Soft, non-tender,  non-distended. Bowel sounds present. No organomegaly or mass.  EXTREMITIES: No pedal edema, cyanosis, or clubbing.  NEUROLOGIC: Awake, alert and oriented x3. Sensation intact. Gait not checked.  PSYCHIATRIC: The patient is alert and oriented x 3.  SKIN: No obvious rash, lesion, or ulcer.   DATA REVIEW:   CBC Recent Labs  Lab 02/20/18 0354  WBC 8.2  HGB 12.1*  HCT 36.5*  PLT 190    Chemistries  Recent Labs  Lab 02/18/18 1059  02/20/18 0354  NA 138   < > 138  K 2.9*   < > 3.3*  CL 100   < > 106  CO2 26   < > 27  GLUCOSE 170*   < > 109*  BUN 18   < > 12  CREATININE 1.15   < > 0.93  CALCIUM 9.0   < > 8.1*  MG 1.6*  --  1.7  AST 34  --   --   ALT 21  --   --   ALKPHOS 56  --   --   BILITOT 1.2  --   --    < > = values in this interval not displayed.    Cardiac Enzymes Recent Labs  Lab 02/18/18 1059  TROPONINI <0.03    Microbiology Results  Results for orders placed or performed during the hospital encounter of 02/18/18  Culture, blood (Routine x 2)     Status: None (Preliminary result)   Collection Time: 02/18/18 10:59 AM  Result Value Ref Range Status   Specimen Description BLOOD L WRIST  Final   Special Requests   Final    BOTTLES DRAWN AEROBIC AND ANAEROBIC Blood Culture adequate volume   Culture   Final    NO GROWTH 3 DAYS Performed at [facility], [address] [place], [place] [address]    Report Status PENDING  Incomplete  Culture, blood (Routine x 2)     Status: None (Preliminary result)   Collection Time: 02/18/18  1:38 PM  Result Value Ref Range Status   Specimen Description   Final    BLOOD BLOOD LEFT FOREARM Performed at [facility], [address], [place], [place] [address]    Special Requests   Final    BOTTLES DRAWN AEROBIC AND ANAEROBIC Blood Culture results may not be optimal due to an excessive volume of blood received in culture bottles Performed at [facility], [address] [place], [place]  [address]    Culture  Setup Time   Final    Organism ID to follow [place] CALLED TO, READ BACK BY AND VERIFIED WITH: HANK ZOMPA AT 3818 ON 02/19/2018 [place]. Performed at [facility], [address], [place], [place] [address]    Culture   Final    Lonell Grandchild POSITIVE COCCI CULTURE  REINCUBATED FOR BETTER GROWTH Performed at [facility], [address], [place], [place] [address]    Report Status PENDING  Incomplete  Blood Culture ID Panel (Reflexed)     Status: Abnormal   Collection Time: 02/18/18  1:38 PM  Result Value Ref Range Status   Enterococcus species NOT DETECTED NOT DETECTED Final   Listeria monocytogenes NOT DETECTED NOT DETECTED Final   Staphylococcus species DETECTED (A) NOT DETECTED Final    Comment: Methicillin (oxacillin) susceptible coagulase negative staphylococcus. Possible blood culture contaminant (unless isolated from more than one blood culture draw or clinical case suggests pathogenicity). No antibiotic treatment is indicated for blood  culture contaminants. CRITICAL RESULT CALLED TO, READ BACK BY AND VERIFIED WITH: HANK ZOMPA AT 4627 ON 02/19/2018 [place].    Staphylococcus aureus (BCID) NOT DETECTED NOT DETECTED Final   Methicillin resistance NOT DETECTED NOT DETECTED Final   Streptococcus species NOT DETECTED NOT DETECTED Final   Streptococcus agalactiae NOT DETECTED NOT DETECTED Final   Streptococcus pneumoniae NOT DETECTED NOT DETECTED Final   Streptococcus pyogenes NOT DETECTED NOT DETECTED Final   Acinetobacter baumannii NOT DETECTED NOT DETECTED Final   Enterobacteriaceae species NOT DETECTED NOT DETECTED Final   Enterobacter cloacae complex NOT DETECTED NOT DETECTED Final   Escherichia coli NOT DETECTED NOT DETECTED Final   Klebsiella oxytoca NOT DETECTED NOT DETECTED Final   Klebsiella pneumoniae NOT DETECTED NOT DETECTED Final   Proteus species NOT DETECTED NOT DETECTED Final   Serratia marcescens  NOT DETECTED NOT DETECTED Final   Haemophilus influenzae NOT DETECTED NOT DETECTED Final   Neisseria meningitidis NOT DETECTED NOT DETECTED Final   Pseudomonas aeruginosa NOT DETECTED NOT DETECTED Final   Candida albicans NOT DETECTED NOT DETECTED Final   Candida glabrata NOT DETECTED NOT DETECTED Final   Candida krusei NOT DETECTED NOT DETECTED Final   Candida parapsilosis NOT DETECTED NOT DETECTED Final   Candida tropicalis NOT DETECTED NOT DETECTED Final    Comment: Performed at [facility], [address] [place], [place] [address]  Expectorated sputum assessment w rflx to resp cult     Status: None   Collection Time: 02/19/18  9:35 AM  Result Value Ref Range Status   Specimen Description SPUTUM  Final   Special Requests NONE  Final   Sputum evaluation   Final    THIS SPECIMEN IS ACCEPTABLE FOR SPUTUM CULTURE Performed at [facility], [address], [place], [place] [address]    Report Status 02/19/2018 FINAL  Final  Culture, respiratory     Status: None (Preliminary result)   Collection Time: 02/19/18  9:35 AM  Result Value Ref Range Status   Specimen Description   Final    SPUTUM Performed at [facility], [address], [place], [place] [address]    Special Requests   Final    NONE Reflexed from [id number] Performed at [facility], [address] [place], [name] [address]    Gram Stain   Final    ABUNDANT WBC PRESENT, PREDOMINANTLY PMN NO ORGANISMS SEEN    Culture   Final    CULTURE REINCUBATED FOR BETTER GROWTH Performed at [facility], [address], [place], [place] [address]    Report Status PENDING  Incomplete    RADIOLOGY:  Dg Chest Portable 1 View  Result Date: 02/18/2018 CLINICAL DATA:  Cough.  Fever.  Pain following recent fall EXAM: PORTABLE CHEST 1 VIEW COMPARISON:  January 06, 2016 FINDINGS: There is  no appreciable edema or consolidation. Heart is upper normal in size with pulmonary vascularity normal.  No adenopathy. There is aortic atherosclerosis. IMPRESSION: No edema or consolidation.  There is aortic atherosclerosis. Electronically Signed   By: Lowella Grip III M.D.   On: 02/18/2018 11:38    EKG:   Orders placed or performed during the hospital encounter of 02/18/18  . EKG 12-Lead  . EKG 12-Lead  . EKG 12-Lead  . EKG 12-Lead      Management plans discussed with the patient, family and they are in agreement.  CODE STATUS:     Code Status Orders  (From admission, onward)         Start     Ordered   02/18/18 2014  Full code  Continuous     02/18/18 2014        Code Status History    Date Active Date Inactive Code Status Order ID Comments User Context   08/21/2017 1301 08/25/2017 1820 Full Code [id number]  Henreitta Leber, MD ED   01/06/2016 1752 01/08/2016 1456 Full Code [id number]  Hower, Aaron Mose, MD ED      TOTAL TIME TAKING CARE OF THIS PATIENT: 43  minutes.   Note: This dictation was prepared with Dragon dictation along with smaller phrase technology. Any transcriptional errors that result from this process are unintentional.   _0 @  on 02/21/2018 at 12:37 PM  Between 7am to 6pm - Pager - [phone]  After 6pm go to [email or web address] - password [email or web address] [place] [facility]  Office  [phone]  CC: Primary care physician; [name], MD

## 2018-02-21 NOTE — Discharge Instructions (Signed)
Follow-up with primary care physician at the facility in 3 to 4 days

## 2018-02-21 NOTE — Clinical Social Work Note (Signed)
Patient is medically ready for discharge today. CSW notified patient's daughter Franky Macho [phone] of discharge to [facility] today. CSW also notified Verlon Au at [facility] of discharge today. Patient will be transported by EMS. RN to call report and call for transport.   Ruthe Mannan MSW, [address] [phone]

## 2018-02-22 LAB — CULTURE, RESPIRATORY W GRAM STAIN: Culture: NORMAL

## 2018-02-23 LAB — CULTURE, BLOOD (ROUTINE X 2)
CULTURE: NO GROWTH
Special Requests: ADEQUATE

## 2018-09-12 DIAGNOSIS — A419 Sepsis, unspecified organism: Secondary | ICD-10-CM

## 2018-09-12 HISTORY — DX: Sepsis, unspecified organism: A41.9

## 2018-10-02 ENCOUNTER — Other Ambulatory Visit: Payer: Self-pay

## 2018-10-02 ENCOUNTER — Emergency Department: Payer: Medicare Other

## 2018-10-02 ENCOUNTER — Inpatient Hospital Stay
Admission: EM | Admit: 2018-10-02 | Discharge: 2018-10-05 | DRG: 871 | Disposition: A | Discharge status: Skilled Nursing Facility | Payer: Medicare Other | Attending: Internal Medicine | Admitting: Internal Medicine

## 2018-10-02 ENCOUNTER — Encounter: Payer: Self-pay | Admitting: Emergency Medicine

## 2018-10-02 DIAGNOSIS — A4151 Sepsis due to Escherichia coli [E. coli]: Principal | ICD-10-CM | POA: Diagnosis present

## 2018-10-02 DIAGNOSIS — Z20828 Contact with and (suspected) exposure to other viral communicable diseases: Secondary | ICD-10-CM | POA: Diagnosis present

## 2018-10-02 DIAGNOSIS — Z8673 Personal history of transient ischemic attack (TIA), and cerebral infarction without residual deficits: Secondary | ICD-10-CM | POA: Diagnosis not present

## 2018-10-02 DIAGNOSIS — I4891 Unspecified atrial fibrillation: Secondary | ICD-10-CM | POA: Diagnosis not present

## 2018-10-02 DIAGNOSIS — R6510 Systemic inflammatory response syndrome (SIRS) of non-infectious origin without acute organ dysfunction: Secondary | ICD-10-CM

## 2018-10-02 DIAGNOSIS — Z79899 Other long term (current) drug therapy: Secondary | ICD-10-CM

## 2018-10-02 DIAGNOSIS — I10 Essential (primary) hypertension: Secondary | ICD-10-CM | POA: Diagnosis present

## 2018-10-02 DIAGNOSIS — Z9181 History of falling: Secondary | ICD-10-CM

## 2018-10-02 DIAGNOSIS — R296 Repeated falls: Secondary | ICD-10-CM | POA: Diagnosis present

## 2018-10-02 DIAGNOSIS — N390 Urinary tract infection, site not specified: Secondary | ICD-10-CM | POA: Diagnosis present

## 2018-10-02 DIAGNOSIS — Z8249 Family history of ischemic heart disease and other diseases of the circulatory system: Secondary | ICD-10-CM | POA: Diagnosis not present

## 2018-10-02 DIAGNOSIS — R531 Weakness: Secondary | ICD-10-CM | POA: Diagnosis present

## 2018-10-02 DIAGNOSIS — G214 Vascular parkinsonism: Secondary | ICD-10-CM | POA: Diagnosis not present

## 2018-10-02 DIAGNOSIS — Z7902 Long term (current) use of antithrombotics/antiplatelets: Secondary | ICD-10-CM | POA: Diagnosis not present

## 2018-10-02 DIAGNOSIS — Z79891 Long term (current) use of opiate analgesic: Secondary | ICD-10-CM | POA: Diagnosis not present

## 2018-10-02 DIAGNOSIS — Z823 Family history of stroke: Secondary | ICD-10-CM | POA: Diagnosis not present

## 2018-10-02 DIAGNOSIS — E785 Hyperlipidemia, unspecified: Secondary | ICD-10-CM | POA: Diagnosis present

## 2018-10-02 DIAGNOSIS — R911 Solitary pulmonary nodule: Secondary | ICD-10-CM | POA: Diagnosis present

## 2018-10-02 DIAGNOSIS — Z7982 Long term (current) use of aspirin: Secondary | ICD-10-CM

## 2018-10-02 DIAGNOSIS — Z8701 Personal history of pneumonia (recurrent): Secondary | ICD-10-CM

## 2018-10-02 DIAGNOSIS — N3001 Acute cystitis with hematuria: Secondary | ICD-10-CM | POA: Diagnosis present

## 2018-10-02 DIAGNOSIS — A419 Sepsis, unspecified organism: Secondary | ICD-10-CM | POA: Diagnosis present

## 2018-10-02 DIAGNOSIS — I480 Paroxysmal atrial fibrillation: Secondary | ICD-10-CM | POA: Diagnosis present

## 2018-10-02 DIAGNOSIS — E876 Hypokalemia: Secondary | ICD-10-CM | POA: Diagnosis present

## 2018-10-02 DIAGNOSIS — Z87891 Personal history of nicotine dependence: Secondary | ICD-10-CM

## 2018-10-02 DIAGNOSIS — G20 Parkinson's disease: Secondary | ICD-10-CM | POA: Diagnosis present

## 2018-10-02 DIAGNOSIS — G92 Toxic encephalopathy: Secondary | ICD-10-CM | POA: Diagnosis present

## 2018-10-02 DIAGNOSIS — I959 Hypotension, unspecified: Secondary | ICD-10-CM | POA: Diagnosis not present

## 2018-10-02 LAB — LIPASE, BLOOD: Lipase: 30 U/L (ref 11–51)

## 2018-10-02 LAB — BASIC METABOLIC PANEL
Anion gap: 13 (ref 5–15)
BUN: 23 mg/dL (ref 8–23)
CO2: 25 mmol/L (ref 22–32)
Calcium: 9.2 mg/dL (ref 8.9–10.3)
Chloride: 98 mmol/L (ref 98–111)
Creatinine, Ser: 1.08 mg/dL (ref 0.61–1.24)
GFR calc Af Amer: >60 mL/min (ref >60)
GFR calc non Af Amer: >60 mL/min (ref >60)
Glucose, Bld: 112 mg/dL — ABNORMAL HIGH (ref 70–99)
Potassium: 3 mmol/L — ABNORMAL LOW (ref 3.5–5.1)
Sodium: 136 mmol/L (ref 135–145)

## 2018-10-02 LAB — CBC
HCT: 38.2 % — ABNORMAL LOW (ref 39.0–52.0)
Hemoglobin: 12.7 g/dL — ABNORMAL LOW (ref 13.0–17.0)
MCH: 29.2 pg (ref 26.0–34.0)
MCHC: 33.2 g/dL (ref 30.0–36.0)
MCV: 87.8 fL (ref 80.0–100.0)
Platelets: 195 10*3/uL (ref 150–400)
RBC: 4.35 MIL/uL (ref 4.22–5.81)
RDW: 14 % (ref 11.5–15.5)
WBC: 16.8 10*3/uL — ABNORMAL HIGH (ref 4.0–10.5)
nRBC: 0 % (ref 0.0–0.2)

## 2018-10-02 LAB — URINALYSIS, COMPLETE (UACMP) WITH MICROSCOPIC
Bilirubin Urine: NEGATIVE
Glucose, UA: NEGATIVE mg/dL
Ketones, ur: 5 mg/dL — AB
Nitrite: POSITIVE — AB
Protein, ur: 100 mg/dL — AB
RBC / HPF: >50 RBC/hpf — ABNORMAL HIGH (ref 0–5)
Specific Gravity, Urine: 1.021 (ref 1.005–1.030)
WBC, UA: >50 WBC/hpf — ABNORMAL HIGH (ref 0–5)
pH: 5 (ref 5.0–8.0)

## 2018-10-02 LAB — LACTIC ACID, PLASMA
Lactic Acid, Venous: 1.1 mmol/L (ref 0.5–1.9)
Lactic Acid, Venous: 1 mmol/L (ref 0.5–1.9)

## 2018-10-02 LAB — HEPATIC FUNCTION PANEL
ALT: 20 U/L (ref 0–44)
AST: 28 U/L (ref 15–41)
Albumin: 3.8 g/dL (ref 3.5–5.0)
Alkaline Phosphatase: 62 U/L (ref 38–126)
Bilirubin, Direct: 0.3 mg/dL — ABNORMAL HIGH (ref 0.0–0.2)
Indirect Bilirubin: 1.3 mg/dL — ABNORMAL HIGH (ref 0.3–0.9)
Total Bilirubin: 1.6 mg/dL — ABNORMAL HIGH (ref 0.3–1.2)
Total Protein: 7.2 g/dL (ref 6.5–8.1)

## 2018-10-02 MED ORDER — SODIUM CHLORIDE 0.9 % IV SOLN
1.0000 g | Freq: Once | INTRAVENOUS | Status: AC
  Administered 2018-10-02: 1 g via INTRAVENOUS
  Filled 2018-10-02: qty 10

## 2018-10-02 MED ORDER — SODIUM CHLORIDE 0.9 % IV BOLUS
1000.0000 mL | Freq: Once | INTRAVENOUS | Status: AC
  Administered 2018-10-02: 1000 mL via INTRAVENOUS

## 2018-10-02 MED ORDER — DILTIAZEM HCL 25 MG/5ML IV SOLN
10.0000 mg | Freq: Once | INTRAVENOUS | Status: AC
  Administered 2018-10-02: 10 mg via INTRAVENOUS
  Filled 2018-10-02: qty 5

## 2018-10-02 NOTE — ED Notes (Signed)
Called floor to give report. Was placed on hold will call back in 15 mins.

## 2018-10-02 NOTE — ED Notes (Signed)
Pt sating at 44 when sleeping. This RN placed pt on 1L via [place] at this time sating at 96%

## 2018-10-02 NOTE — ED Notes (Signed)
Patient transported to CT 

## 2018-10-02 NOTE — H&P (Signed)
[facility] [place] at [facility]   PATIENT NAME: Kevin NordmannHenry Wachter    MR#:  [id number]  DATE OF BIRTH:  11/15/1935  DATE OF ADMISSION:  10/02/2018  PRIMARY CARE PHYSICIAN: Gracelyn NurseJohnston, John D, MD   REQUESTING/REFERRING PHYSICIAN: Fuller PlanFunke, MD  CHIEF COMPLAINT:   Chief Complaint  Patient presents with  . Dysuria  . Weakness  . Altered Mental Status    HISTORY OF PRESENT ILLNESS:  Kevin Bradshaw  is a 83 y.o. male who presents with chief complaint as above.  Patient presents to the ED with some confusion, dysuria, generalized weakness.  Here he is found to meet sepsis criteria and has UTI.  In the ED he developed A. fib with RVR.  On chart review it appears that this is a new diagnosis for this patient.  Hospitalist called for admission  PAST MEDICAL HISTORY:   Past Medical History:  Diagnosis Date  . Hyperlipidemia   . Hypertension   . Parkinson disease (HCC)      PAST SURGICAL HISTORY:   Past Surgical History:  Procedure Laterality Date  . CARDIOVASCULAR STRESS TEST     normal  . CARPAL TUNNEL RELEASE  2009   right hand  . X-STOP IMPLANTATION    . X-STOP IMPLANTATION     Dr. Gerrit Heckaliff     SOCIAL HISTORY:   Social History   Tobacco Use  . Smoking status: Former Smoker    Packs/day: 1.50    Years: 50.00    Pack years: 75.00    Types: Cigarettes    Quit date: 11/04/2002    Years since quitting: 15.9  . Smokeless tobacco: Never Used  Substance Use Topics  . Alcohol use: Yes    Comment: once a week     FAMILY HISTORY:   Family History  Problem Relation Age of Onset  . Heart disease Father [id number]  . Cancer Sister        ? type  . Heart disease Brother   . Cancer Brother        ? type  . Stroke Child   . Seizures Child      DRUG ALLERGIES:  No Known Allergies  MEDICATIONS AT HOME:   Prior to Admission medications   Medication Sig Start Date End Date Taking? Authorizing Provider  acetaminophen (TYLENOL) 325 MG tablet Take 2 tablets  (650 mg total) by mouth every 6 (six) hours as needed for mild pain (or Fever >/= 101). 02/21/18  Yes Gouru, Aruna, MD  amLODipine (NORVASC) 5 MG tablet Take 5 mg by mouth daily.   Yes [provider]  aspirin 81 MG tablet Take 81 mg by mouth daily.     Yes [provider]  atorvastatin (LIPITOR) 80 MG tablet Take 1 tablet (80 mg total) by mouth daily at 6 PM. 08/23/17  Yes Shaune Pollackhen, Qing, MD  clopidogrel (PLAVIX) 75 MG tablet Take 1 tablet (75 mg total) by mouth daily. 08/23/17  Yes Shaune Pollackhen, Qing, MD  hydrochlorothiazide (MICROZIDE) 12.5 MG capsule Take 12.5 mg by mouth daily.   Yes [provider]  KLOR-CON M20 20 MEQ tablet Take 20 mEq by mouth 2 (two) times daily. 06/15/17  Yes [provider]  tamsulosin (FLOMAX) 0.4 MG CAPS capsule Take 0.4 mg by mouth daily. 09/06/18 09/06/19 Yes [provider]  Calcium Carbonate (CALCIUM 500 PO) Take 2 tablets by mouth daily.    01/06/16  [provider]    REVIEW OF SYSTEMS:  Review of Systems  Constitutional: Negative for chills, fever, malaise/fatigue and weight loss.  HENT: Negative for ear pain, hearing loss and tinnitus.   Eyes: Negative for blurred vision, double vision, pain and redness.  Respiratory: Negative for cough, hemoptysis and shortness of breath.   Cardiovascular: Negative for chest pain, palpitations, orthopnea and leg swelling.  Gastrointestinal: Negative for abdominal pain, constipation, diarrhea, nausea and vomiting.  Genitourinary: Positive for dysuria. Negative for frequency and hematuria.  Musculoskeletal: Negative for back pain, joint pain and neck pain.  Skin:       No acne, rash, or lesions  Neurological: Positive for weakness. Negative for dizziness, tremors and focal weakness.  Endo/Heme/Allergies: Negative for polydipsia. Does not bruise/bleed easily.  Psychiatric/Behavioral: Negative for depression. The patient is not nervous/anxious and does not have insomnia.      VITAL  SIGNS:   Vitals:   10/02/18 1546 10/02/18 1935 10/02/18 2000 10/02/18 2230  BP:  (!) 155/89 (!) 162/98 134/77  Pulse:  (!) 114 97 (!) 112  Resp:  18 17 20   Temp:      TempSrc:      SpO2:  96% 95% 95%  Weight: 63.5 kg     Height: 5\' 3"  (1.6 m)      Wt Readings from Last 3 Encounters:  10/02/18 63.5 kg  02/18/18 68 kg  08/21/17 68.9 kg    PHYSICAL EXAMINATION:  Physical Exam  Vitals reviewed. Constitutional: He is oriented to person, place, and time. He appears well-developed and well-nourished. No distress.  HENT:  Head: Normocephalic and atraumatic.  Mouth/Throat: Oropharynx is clear and moist.  Eyes: Pupils are equal, round, and reactive to light. Conjunctivae and EOM are normal. No scleral icterus.  Neck: Normal range of motion. Neck supple. No JVD present. No thyromegaly present.  Cardiovascular: Intact distal pulses. Exam reveals no gallop and no friction rub.  No murmur heard. Tachycardic, irregular rhythm  Respiratory: Effort normal and breath sounds normal. No respiratory distress. He has no wheezes. He has no rales.  GI: Soft. Bowel sounds are normal. He exhibits no distension. There is abdominal tenderness (Low abdomen).  Musculoskeletal: Normal range of motion.        General: No edema.     Comments: No arthritis, no gout  Lymphadenopathy:    He has no cervical adenopathy.  Neurological: He is alert and oriented to person, place, and time. No cranial nerve deficit.  No dysarthria, no aphasia  Skin: Skin is warm and dry. No rash noted. No erythema.  Psychiatric: He has a normal mood and affect. His behavior is normal. Judgment and thought content normal.    LABORATORY PANEL:   CBC Recent Labs  Lab 10/02/18 1550  WBC 16.8*  HGB 12.7*  HCT 38.2*  PLT 195   ------------------------------------------------------------------------------------------------------------------  Chemistries  Recent Labs  Lab 10/02/18 1550  NA 136  K 3.0*  CL 98  CO2 25   GLUCOSE 112*  BUN 23  CREATININE 1.08  CALCIUM 9.2  AST 28  ALT 20  ALKPHOS 62  BILITOT 1.6*   ------------------------------------------------------------------------------------------------------------------  Cardiac Enzymes No results for input(s): TROPONINI in the last 168 hours. ------------------------------------------------------------------------------------------------------------------  RADIOLOGY:  Ct Head Wo Contrast  Result Date: 10/02/2018 CLINICAL DATA:  Head trauma. Multiple unwitnessed falls over the last 2 days. EXAM: CT HEAD WITHOUT CONTRAST TECHNIQUE: Contiguous axial images were obtained from the base of the skull through the vertex without intravenous contrast. COMPARISON:  08/21/2017 FINDINGS: Brain: No evidence of acute infarction, hemorrhage, hydrocephalus, extra-axial collection or  mass lesion/mass effect. There is mild diffuse low-attenuation within the subcortical and periventricular white matter compatible with chronic microvascular disease. Chronic left basal ganglia lacunar infarct. Prominence of the sulci and ventricles compatible with brain atrophy. Vascular: No hyperdense vessel or unexpected calcification. Skull: Normal. Negative for fracture or focal lesion. Sinuses/Orbits: No acute finding. Other: None IMPRESSION: 1. No acute intracranial abnormality. 2. Chronic small vessel ischemic disease with brain atrophy. 3. Old left basal ganglia lacunar infarct. Electronically Signed   By: Signa Kell M.D.   On: 10/02/2018 17:25   Ct Renal Stone Study  Result Date: 10/02/2018 CLINICAL DATA:  Left flank pain. Left lower back pain. Multiple falls in the last 2 days. EXAM: CT ABDOMEN AND PELVIS WITHOUT CONTRAST TECHNIQUE: Multidetector CT imaging of the abdomen and pelvis was performed following the standard protocol without IV contrast. COMPARISON:  None. FINDINGS: Lower chest: 5 mm pulmonary nodule in the anterior aspect of the right middle lobe. Small  ill-defined area of density in the lingula and minimal atelectasis in the medial aspect of the right middle lobe. Aortic atherosclerosis. Coronary artery calcifications. Heart size is normal. No pericardial effusion. Hepatobiliary: The liver parenchyma appears normal. There is a single tiny stone in the otherwise normal appearing gallbladder. There is a 10 x 4 mm oblong stone in the region of the distal common bile duct. Common bile duct diameter is 8 mm. Is the patient's bilirubin elevated? Pancreas: Pancreas is normal. No pancreatic ductal dilatation. Spleen: Normal. Adrenals/Urinary Tract: Adrenal glands are unremarkable. Kidneys are normal, without renal calculi, focal lesion, or hydronephrosis. Bladder is unremarkable. Stomach/Bowel: Stomach is within normal limits. Appendix appears normal. No evidence of bowel wall thickening, distention, or inflammatory changes. Vascular/Lymphatic: Aortic atherosclerosis. No enlarged abdominal or pelvic lymph nodes. Reproductive: Prostate is unremarkable. Other: No abdominal wall hernia or abnormality. No abdominopelvic ascites. Musculoskeletal: No acute abnormality. Multilevel degenerative disc and joint disease. Posterior decompression at L3-4 through L5-S1. IMPRESSION: 1. 10 x 4 mm stone in the region of the distal common bile duct with slight dilatation of the common bile duct. 2. Tiny stone in the otherwise normal appearing gallbladder. 3. Aortic atherosclerosis. 4. 5 mm pulmonary nodule in the anterior aspect of the right middle lobe. If the patient is at low risk for lung cancer, no further follow-up is recommended. If the patient is at high risk for lung cancer, follow-up CT scan of the chest without contrast in 12 months is recommended. Aortic Atherosclerosis (ICD10-I70.0). Electronically Signed   By: Francene Boyers M.D.   On: 10/02/2018 17:30    EKG:   Orders placed or performed during the hospital encounter of 10/02/18  . EKG 12-Lead  . EKG 12-Lead     IMPRESSION AND PLAN:  Principal Problem:   Sepsis (HCC) -sepsis is due to UTI.  Lactic acid was within normal limits.  Blood pressure has been stable.  IV antibiotics were given.  Cultures were sent Active Problems:   UTI (urinary tract infection) -IV antibiotics and culture as above   Atrial fibrillation with RVR (HCC) -given push dose IV Cardizem in the ED, will give repeat dosing as needed until we achieve heart rate less than 110   Hypertension -home dose antihypertensives   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HCC) -continue home meds   Hyperlipidemia -home dose antilipid  Chart review performed and case discussed with ED provider. Labs, imaging and/or ECG reviewed by provider and discussed with patient/family. Management plans discussed with the patient and/or family.  COVID-19 status: Pending  DVT PROPHYLAXIS: SubQ lovenox   GI PROPHYLAXIS:  None  ADMISSION STATUS: Inpatient     CODE STATUS: Full Code Status History    Date Active Date Inactive Code Status Order ID Comments User Context   02/18/2018 2014 02/21/2018 1814 Full Code [id number]  Demetrios Loll, MD Inpatient   08/21/2017 1301 08/25/2017 1820 Full Code [id number]  Henreitta Leber, MD ED   01/06/2016 1752 01/08/2016 1456 Full Code [id number]  Hower, Aaron Mose, MD ED   Advance Care Planning Activity      TOTAL TIME TAKING CARE OF THIS PATIENT: 45 minutes.   This patient was evaluated in the context of the global COVID-19 pandemic, which necessitated consideration that the patient might be at risk for infection with the SARS-CoV-2 virus that causes COVID-19. Institutional protocols and algorithms that pertain to the evaluation of patients at risk for COVID-19 are in a state of rapid change based on information released by regulatory bodies including the CDC and federal and state organizations. These policies and algorithms were followed to the best of this provider's knowledge to date during the patient's care at this facility.  Ethlyn Daniels 10/02/2018, 10:42 PM  [facility]  Office  [phone]  CC: Primary care physician; [name], MD  Note:  This document was prepared using Dragon voice recognition software and may include unintentional dictation errors.

## 2018-10-02 NOTE — ED Notes (Signed)
Pt's on Afib RVR; HR 130's at this time. EKG done and given to MD Jessup.

## 2018-10-02 NOTE — Progress Notes (Signed)
CODE SEPSIS - PHARMACY COMMUNICATION  **Broad Spectrum Antibiotics should be administered within 1 hour of Sepsis diagnosis**  Time Code Sepsis Called/Page Received: 2015  Antibiotics Ordered: Rocephin  Time of 1st antibiotic administration: 1930(prior to page received)  Additional action taken by pharmacy: none  If necessary, Name of Provider/Nurse Contacted: n/a    Pearla Dubonnet ,PharmD Clinical Pharmacist  10/02/2018  8:12 PM

## 2018-10-02 NOTE — ED Notes (Signed)
No sign and held orders nor new ER orders at this time. This RN will continue to monitor pt.

## 2018-10-02 NOTE — ED Notes (Signed)
ED Provider Funk at bedside. 

## 2018-10-02 NOTE — ED Notes (Signed)
ED TO INPATIENT HANDOFF REPORT  ED Nurse Name and Phone #: [phone]  S Name/Age/Gender Kevin Bradshaw 83 y.o. male Room/Bed: ED17A/ED17A  Code Status   Code Status: Prior  Home/SNF/Other Home A/Ox4 Is this baseline? Yes   Triage Complete: Triage complete  Chief Complaint Unable to void/Fever  Triage Note Per daughter patient began with urinary frequency and weakness since. States has seemed confused and patient speaks clearly but cannot state why he is here. Patient lives with his daughter. Patient is not normally confused per daughter.    Allergies No Known Allergies  Level of Care/Admitting Diagnosis ED Disposition    ED Disposition Condition Comment   Admit  The patient appears reasonably stabilized for admission considering the current resources, flow, and capabilities available in the ED at this time, and I doubt any other [facility] requiring further screening and/or treatment in the ED prior to admission is  present.       B Medical/Surgery History Past Medical History:  Diagnosis Date  . Hyperlipidemia   . Hyperlipidemia   . Hypertension   . Parkinson disease [facility](HCC)    Past Surgical History:  Procedure Laterality Date  . CARDIOVASCULAR STRESS TEST     normal  . CARPAL TUNNEL RELEASE  2009   right hand  . X-STOP IMPLANTATION    . X-STOP IMPLANTATION     Dr. Kerrin Moaliff     A IV Location/Drains/Wounds Patient Lines/Drains/Airways Status   Active Line/Drains/Airways    Name:   Placement date:   Placement time:   Site:   Days:   Peripheral IV 10/02/18 Left Antecubital   10/02/18    1802    Antecubital   less than 1   Peripheral IV 10/02/18 Right Antecubital   10/02/18    1933    Antecubital   less than 1          Intake/Output Last 24 hours No intake or output data in the 24 hours ending 10/02/18 1942  Labs/Imaging Results for orders placed or performed during the hospital encounter of 10/02/18 (from the past 48 hour(s))  Lactic acid, plasma     Status:  None   Collection Time: 10/02/18  3:49 PM  Result Value Ref Range   Lactic Acid, Venous 1.1 0.5 - 1.9 mmol/L    Comment: Performed at [facility], [address] [place], [place] [address]  CBC     Status: Abnormal   Collection Time: 10/02/18  3:50 PM  Result Value Ref Range   WBC 16.8 (H) 4.0 - 10.5 K/uL   RBC 4.35 4.22 - 5.81 MIL/uL   Hemoglobin 12.7 (L) 13.0 - 17.0 g/dL   HCT 60.438.2 (L) 54.039.0 - 98.152.0 %   MCV 87.8 80.0 - 100.0 fL   MCH 29.2 26.0 - 34.0 pg   MCHC 33.2 30.0 - 36.0 g/dL   RDW 19.114.0 47.811.5 - 29.515.5 %   Platelets 195 150 - 400 K/uL   nRBC 0.0 0.0 - 0.2 %    Comment: Performed at [facility], [address] [place], [place] [address]  Basic metabolic panel     Status: Abnormal   Collection Time: 10/02/18  3:50 PM  Result Value Ref Range   Sodium 136 135 - 145 mmol/L   Potassium 3.0 (L) 3.5 - 5.1 mmol/L   Chloride 98 98 - 111 mmol/L   CO2 25 22 - 32 mmol/L   Glucose, Bld 112 (H) 70 - 99 mg/dL   BUN 23 8 -  23 mg/dL   Creatinine, Ser 2.68 0.61 - 1.24 mg/dL   Calcium 9.2 8.9 - 34.1 mg/dL   GFR calc non Af Amer >60 >60 mL/min   GFR calc Af Amer >60 >60 mL/min   Anion gap 13 5 - 15    Comment: Performed at [facility], [address] [place], [place] [address]  Hepatic function panel     Status: Abnormal   Collection Time: 10/02/18  3:50 PM  Result Value Ref Range   Total Protein 7.2 6.5 - 8.1 g/dL   Albumin 3.8 3.5 - 5.0 g/dL   AST 28 15 - 41 U/L   ALT 20 0 - 44 U/L   Alkaline Phosphatase 62 38 - 126 U/L   Total Bilirubin 1.6 (H) 0.3 - 1.2 mg/dL   Bilirubin, Direct 0.3 (H) 0.0 - 0.2 mg/dL   Indirect Bilirubin 1.3 (H) 0.3 - 0.9 mg/dL    Comment: Performed at [facility], [address] [place], [place] [address]  Lipase, blood     Status: None   Collection Time: 10/02/18  3:50 PM  Result Value Ref Range   Lipase 30 11 - 51 U/L    Comment: Performed at [facility], [address] [place], [place] [address]   Lactic acid, plasma     Status: None   Collection Time: 10/02/18  5:49 PM  Result Value Ref Range   Lactic Acid, Venous 1.0 0.5 - 1.9 mmol/L    Comment: Performed at [facility], [address] [place], [place] [address]  Urinalysis, Complete w Microscopic     Status: Abnormal   Collection Time: 10/02/18  5:56 PM  Result Value Ref Range   Color, Urine YELLOW (A) YELLOW   APPearance TURBID (A) CLEAR   Specific Gravity, Urine 1.021 1.005 - 1.030   pH 5.0 5.0 - 8.0   Glucose, UA NEGATIVE NEGATIVE mg/dL   Hgb urine dipstick LARGE (A) NEGATIVE   Bilirubin Urine NEGATIVE NEGATIVE   Ketones, ur 5 (A) NEGATIVE mg/dL   Protein, ur 144 (A) NEGATIVE mg/dL   Nitrite POSITIVE (A) NEGATIVE   Leukocytes,Ua MODERATE (A) NEGATIVE   RBC / HPF >50 (H) 0 - 5 RBC/hpf   WBC, UA >50 (H) 0 - 5 WBC/hpf   Bacteria, UA FEW (A) NONE SEEN   Squamous Epithelial / LPF 0-5 0 - 5   WBC Clumps PRESENT    Mucus PRESENT     Comment: Performed at [facility], [address], [place], [place] [address]   Ct Head Wo Contrast  Result Date: 10/02/2018 CLINICAL DATA:  Head trauma. Multiple unwitnessed falls over the last 2 days. EXAM: CT HEAD WITHOUT CONTRAST TECHNIQUE: Contiguous axial images were obtained from the base of the skull through the vertex without intravenous contrast. COMPARISON:  08/21/2017 FINDINGS: Brain: No evidence of acute infarction, hemorrhage, hydrocephalus, extra-axial collection or mass lesion/mass effect. There is mild diffuse low-attenuation within the subcortical and periventricular white matter compatible with chronic microvascular disease. Chronic left basal ganglia lacunar infarct. Prominence of the sulci and ventricles compatible with brain atrophy. Vascular: No hyperdense vessel or unexpected calcification. Skull: Normal. Negative for fracture or focal lesion. Sinuses/Orbits: No acute finding. Other: None IMPRESSION: 1. No acute intracranial abnormality. 2. Chronic small  vessel ischemic disease with brain atrophy. 3. Old left basal ganglia lacunar infarct. Electronically Signed   By: Signa Kell M.D.   On: 10/02/2018 17:25   Ct Renal Stone Study  Result  Date: 10/02/2018 CLINICAL DATA:  Left flank pain. Left lower back pain. Multiple falls in the last 2 days. EXAM: CT ABDOMEN AND PELVIS WITHOUT CONTRAST TECHNIQUE: Multidetector CT imaging of the abdomen and pelvis was performed following the standard protocol without IV contrast. COMPARISON:  None. FINDINGS: Lower chest: 5 mm pulmonary nodule in the anterior aspect of the right middle lobe. Small ill-defined area of density in the lingula and minimal atelectasis in the medial aspect of the right middle lobe. Aortic atherosclerosis. Coronary artery calcifications. Heart size is normal. No pericardial effusion. Hepatobiliary: The liver parenchyma appears normal. There is a single tiny stone in the otherwise normal appearing gallbladder. There is a 10 x 4 mm oblong stone in the region of the distal common bile duct. Common bile duct diameter is 8 mm. Is the patient's bilirubin elevated? Pancreas: Pancreas is normal. No pancreatic ductal dilatation. Spleen: Normal. Adrenals/Urinary Tract: Adrenal glands are unremarkable. Kidneys are normal, without renal calculi, focal lesion, or hydronephrosis. Bladder is unremarkable. Stomach/Bowel: Stomach is within normal limits. Appendix appears normal. No evidence of bowel wall thickening, distention, or inflammatory changes. Vascular/Lymphatic: Aortic atherosclerosis. No enlarged abdominal or pelvic lymph nodes. Reproductive: Prostate is unremarkable. Other: No abdominal wall hernia or abnormality. No abdominopelvic ascites. Musculoskeletal: No acute abnormality. Multilevel degenerative disc and joint disease. Posterior decompression at L3-4 through L5-S1. IMPRESSION: 1. 10 x 4 mm stone in the region of the distal common bile duct with slight dilatation of the common bile duct. 2. Tiny  stone in the otherwise normal appearing gallbladder. 3. Aortic atherosclerosis. 4. 5 mm pulmonary nodule in the anterior aspect of the right middle lobe. If the patient is at low risk for lung cancer, no further follow-up is recommended. If the patient is at high risk for lung cancer, follow-up CT scan of the chest without contrast in 12 months is recommended. Aortic Atherosclerosis (ICD10-I70.0). Electronically Signed   By: Lorriane Shire M.D.   On: 10/02/2018 17:30    Pending Labs Unresulted Labs (From admission, onward)    Start     Ordered   10/02/18 1856  Urine culture  Add-on,   AD     10/02/18 1855   10/02/18 1842  Blood culture (routine x 2)  BLOOD CULTURE X 2,   STAT     10/02/18 1841          Vitals/Pain Today's Vitals   10/02/18 1543 10/02/18 1546 10/02/18 1650 10/02/18 1935  BP: 129/83   (!) 155/89  Pulse: (!) 118   (!) 114  Resp: 20   18  Temp: 99.7 F (37.6 C)     TempSrc: Oral     SpO2: 94%   96%  Weight:  63.5 kg    Height:  5\' 3"  (1.6 m)    PainSc:  8  8      Isolation Precautions No active isolations  Medications Medications  cefTRIAXone (ROCEPHIN) 1 g in sodium chloride 0.9 % 100 mL IVPB (1 g Intravenous New Bag/Given 10/02/18 1930)  sodium chloride 0.9 % bolus 1,000 mL (1,000 mLs Intravenous New Bag/Given 10/02/18 1803)  sodium chloride 0.9 % bolus 1,000 mL (1,000 mLs Intravenous New Bag/Given 10/02/18 1933)    Mobility manual wheelchair High fall risk     R Recommendations: See Admitting Provider Note  Report given to:   Additional Notes:

## 2018-10-02 NOTE — ED Notes (Signed)
In/out with melody at bedside as a witnessed.

## 2018-10-02 NOTE — ED Triage Notes (Signed)
Per daughter patient began with urinary frequency and weakness since. States has seemed confused and patient speaks clearly but cannot state why he is here. Patient lives with his daughter. Patient is not normally confused per daughter.

## 2018-10-02 NOTE — ED Notes (Signed)
Pt provided with a sandwich tray and gingerale at this time. Per pt's request.

## 2018-10-02 NOTE — ED Provider Notes (Signed)
[facility] Emergency Department Provider Note  ____________________________________________   First MD Initiated Contact with Patient 10/02/18 1648     (approximate)  I have reviewed the triage vital signs and the nursing notes.   HISTORY  Chief Complaint Dysuria, Weakness, and Altered Mental Status    HPI Kevin Bradshaw is a 83 y.o. male who presents with increased urinary frequency and weakness.  Patient also had multiple unwitnessed falls in the past 2 days.  The urinary frequency started a few days ago, constant, nothing makes better, nothing makes it worse.  According to family he is also complaining some left flank pain but she is unsure if that was just from some of the falls.  He possibly did hit his head.  No known LOC.          Past Medical History:  Diagnosis Date  . Hyperlipidemia   . Hyperlipidemia   . Hypertension   . Parkinson disease [facility](HCC)     Patient Active Problem List   Diagnosis Date Noted  . Sepsis (HCC) 02/18/2018  . Acute CVA (cerebrovascular accident) (HCC) 08/22/2017  . Right leg weakness 08/21/2017  . 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 CAP (community acquired pneumonia) 01/06/2016  . Productive cough 11/09/2010  . Hypertension 11/04/2010  . Hyperlipidemia 11/04/2010    Past Surgical History:  Procedure Laterality Date  . CARDIOVASCULAR STRESS TEST     normal  . CARPAL TUNNEL RELEASE  2009   right hand  . X-STOP IMPLANTATION    . X-STOP IMPLANTATION     Dr. Gerrit Heckaliff    Prior to Admission medications   Medication Sig Start Date End Date Taking? Authorizing Provider  acetaminophen (TYLENOL) 325 MG tablet Take 2 tablets (650 mg total) by mouth every 6 (six) hours as needed for mild pain (or Fever >/= 101). 02/21/18   Gouru, Deanna ArtisAruna, MD  albuterol (PROVENTIL HFA;VENTOLIN HFA) 108 (90 Base) MCG/ACT inhaler Inhale 2 puffs into the lungs every 6 (six) hours as needed for wheezing or shortness of breath. 02/21/18    Ramonita LabGouru, Aruna, MD  aspirin 81 MG tablet Take 81 mg by mouth daily.      [provider]  atorvastatin (LIPITOR) 80 MG tablet Take 1 tablet (80 mg total) by mouth daily at 6 PM. 08/23/17   Shaune Pollackhen, Qing, MD  bisacodyl (DULCOLAX) 5 MG EC tablet Take 1 tablet (5 mg total) by mouth daily as needed for moderate constipation. 02/21/18   Ramonita LabGouru, Aruna, MD  cefdinir (OMNICEF) 300 MG capsule Take 1 capsule (300 mg total) by mouth every 12 (twelve) hours. 02/21/18   Ramonita LabGouru, Aruna, MD  clopidogrel (PLAVIX) 75 MG tablet Take 1 tablet (75 mg total) by mouth daily. 08/23/17   Shaune Pollackhen, Qing, MD  guaiFENesin-dextromethorphan [facility](ROBITUSSIN DM) 100-10 MG/5ML syrup Take 5 mLs by mouth every 4 (four) hours as needed for cough. 02/21/18   Gouru, Deanna ArtisAruna, MD  KLOR-CON M20 20 MEQ tablet Take 20 mEq by mouth 2 (two) times daily. 06/15/17   [provider]  ondansetron (ZOFRAN) 4 MG tablet Take 1 tablet (4 mg total) by mouth every 6 (six) hours as needed for nausea. 02/21/18   Gouru, Deanna ArtisAruna, MD  traMADol (ULTRAM) 50 MG tablet Take 1 tablet (50 mg total) by mouth every 12 (twelve) hours. 02/21/18   Ramonita LabGouru, Aruna, MD  Calcium Carbonate (CALCIUM 500 PO) Take 2 tablets by mouth daily.    01/06/16  [provider]    Allergies Patient has no known  allergies.  Family History  Problem Relation Age of Onset  . Heart disease Father 46  . Cancer Sister        ? type  . Heart disease Brother   . Cancer Brother        ? type  . Stroke Child   . Seizures Child     Social History Social History   Tobacco Use  . Smoking status: Former Smoker    Packs/day: 1.50    Years: 50.00    Pack years: 75.00    Types: Cigarettes    Quit date: 11/04/2002    Years since quitting: 15.9  . Smokeless tobacco: Never Used  Substance Use Topics  . Alcohol use: Yes    Comment: once a week  . Drug use: No      Review of Systems Constitutional: No fever/chills Eyes: No visual changes. ENT: No sore throat. Cardiovascular:  Denies chest pain. Respiratory: Denies shortness of breath. Gastrointestinal: No abdominal pain.  No nausea, no vomiting.  No diarrhea.  No constipation. Genitourinary: Positive dysuria, increased frequency, left flank pain Musculoskeletal: Negative for back pain. Skin: Negative for rash. Neurological: Negative for headaches, focal weakness or numbness. All other ROS negative ____________________________________________   PHYSICAL EXAM:  VITAL SIGNS: ED Triage Vitals  Enc Vitals Group     BP 10/02/18 1543 129/83     Pulse Rate 10/02/18 1543 (!) 118     Resp 10/02/18 1543 20     Temp 10/02/18 1543 99.7 F (37.6 C)     Temp Source 10/02/18 1543 Oral     SpO2 10/02/18 1543 94 %     Weight 10/02/18 1546 140 lb (63.5 kg)     Height 10/02/18 1546 5\' 3"  (1.6 m)     Head Circumference --      Peak Flow --      Pain Score 10/02/18 1546 8     Pain Loc --      Pain Edu? --      Excl. in [place]? --     Constitutional: Alert and oriented. Well appearing and in no acute distress. Eyes: Conjunctivae are normal. EOMI. Head: Atraumatic. Nose: No congestion/rhinnorhea. Mouth/Throat: Mucous membranes are moist.   Neck: No stridor. Trachea Midline. FROM Cardiovascular tachycardiac, regular rhythm. Grossly normal heart sounds.  Good peripheral circulation. Respiratory: Normal respiratory effort.  No retractions. Lungs CTAB. Gastrointestinal: Soft and nontender. No distention. No abdominal bruits.  Musculoskeletal: No lower extremity tenderness nor edema.  No joint effusions. Neurologic:  Normal speech and language. No gross focal neurologic deficits are appreciated.  Skin:  Skin is warm, dry and intact. No rash noted. Psychiatric: Mood and affect are normal. Speech and behavior are normal. GU: Deferred   ____________________________________________   LABS (all labs ordered are listed, but only abnormal results are displayed)  Labs Reviewed  URINALYSIS, COMPLETE (UACMP) WITH MICROSCOPIC -  Abnormal; Notable for the following components:      Result Value   Color, Urine YELLOW (*)    APPearance TURBID (*)    Hgb urine dipstick LARGE (*)    Ketones, ur 5 (*)    Protein, ur 100 (*)    Nitrite POSITIVE (*)    Leukocytes,Ua MODERATE (*)    RBC / HPF >50 (*)    WBC, UA >50 (*)    Bacteria, UA FEW (*)    All other components within normal limits  CBC - Abnormal; Notable for the following components:   WBC 16.8 (*)  Hemoglobin 12.7 (*)    HCT 38.2 (*)    All other components within normal limits  BASIC METABOLIC PANEL - Abnormal; Notable for the following components:   Potassium 3.0 (*)    Glucose, Bld 112 (*)    All other components within normal limits  HEPATIC FUNCTION PANEL - Abnormal; Notable for the following components:   Total Bilirubin 1.6 (*)    Bilirubin, Direct 0.3 (*)    Indirect Bilirubin 1.3 (*)    All other components within normal limits  CULTURE, BLOOD (ROUTINE X 2)  CULTURE, BLOOD (ROUTINE X 2)  URINE CULTURE  SARS CORONAVIRUS 2 (TAT 6-24 HRS)  LACTIC ACID, PLASMA  LACTIC ACID, PLASMA  LIPASE, BLOOD   ____________________________________________  RADIOLOGY Vela Prose, personally viewed and evaluated these images (plain radiographs) as part of my medical decision making, as well as reviewing the written report by the radiologist.  ED MD interpretation: CT head negative  Official radiology report(s): Ct Head Wo Contrast  Result Date: 10/02/2018 CLINICAL DATA:  Head trauma. Multiple unwitnessed falls over the last 2 days. EXAM: CT HEAD WITHOUT CONTRAST TECHNIQUE: Contiguous axial images were obtained from the base of the skull through the vertex without intravenous contrast. COMPARISON:  08/21/2017 FINDINGS: Brain: No evidence of acute infarction, hemorrhage, hydrocephalus, extra-axial collection or mass lesion/mass effect. There is mild diffuse low-attenuation within the subcortical and periventricular white matter compatible with chronic  microvascular disease. Chronic left basal ganglia lacunar infarct. Prominence of the sulci and ventricles compatible with brain atrophy. Vascular: No hyperdense vessel or unexpected calcification. Skull: Normal. Negative for fracture or focal lesion. Sinuses/Orbits: No acute finding. Other: None IMPRESSION: 1. No acute intracranial abnormality. 2. Chronic small vessel ischemic disease with brain atrophy. 3. Old left basal ganglia lacunar infarct. Electronically Signed   By: Signa Kell M.D.   On: 10/02/2018 17:25   Ct Renal Stone Study  Result Date: 10/02/2018 CLINICAL DATA:  Left flank pain. Left lower back pain. Multiple falls in the last 2 days. EXAM: CT ABDOMEN AND PELVIS WITHOUT CONTRAST TECHNIQUE: Multidetector CT imaging of the abdomen and pelvis was performed following the standard protocol without IV contrast. COMPARISON:  None. FINDINGS: Lower chest: 5 mm pulmonary nodule in the anterior aspect of the right middle lobe. Small ill-defined area of density in the lingula and minimal atelectasis in the medial aspect of the right middle lobe. Aortic atherosclerosis. Coronary artery calcifications. Heart size is normal. No pericardial effusion. Hepatobiliary: The liver parenchyma appears normal. There is a single tiny stone in the otherwise normal appearing gallbladder. There is a 10 x 4 mm oblong stone in the region of the distal common bile duct. Common bile duct diameter is 8 mm. Is the patient's bilirubin elevated? Pancreas: Pancreas is normal. No pancreatic ductal dilatation. Spleen: Normal. Adrenals/Urinary Tract: Adrenal glands are unremarkable. Kidneys are normal, without renal calculi, focal lesion, or hydronephrosis. Bladder is unremarkable. Stomach/Bowel: Stomach is within normal limits. Appendix appears normal. No evidence of bowel wall thickening, distention, or inflammatory changes. Vascular/Lymphatic: Aortic atherosclerosis. No enlarged abdominal or pelvic lymph nodes. Reproductive:  Prostate is unremarkable. Other: No abdominal wall hernia or abnormality. No abdominopelvic ascites. Musculoskeletal: No acute abnormality. Multilevel degenerative disc and joint disease. Posterior decompression at L3-4 through L5-S1. IMPRESSION: 1. 10 x 4 mm stone in the region of the distal common bile duct with slight dilatation of the common bile duct. 2. Tiny stone in the otherwise normal appearing gallbladder. 3. Aortic atherosclerosis. 4. 5 mm  pulmonary nodule in the anterior aspect of the right middle lobe. If the patient is at low risk for lung cancer, no further follow-up is recommended. If the patient is at high risk for lung cancer, follow-up CT scan of the chest without contrast in 12 months is recommended. Aortic Atherosclerosis (ICD10-I70.0). Electronically Signed   By: Francene BoyersJames  Maxwell M.D.   On: 10/02/2018 17:30    ____________________________________________   PROCEDURES  Procedure(s) performed (including Critical Care):  .Critical Care Performed by: Concha SeFunke, Tierrah Anastos E, MD Authorized by: Concha SeFunke, Jayden Rudge E, MD   Critical care provider statement:    Critical care time (minutes):  30   Critical care was necessary to treat or prevent imminent or life-threatening deterioration of the following conditions:  Sepsis   Critical care was time spent personally by me on the following activities:  Discussions with consultants, evaluation of patient's response to treatment, examination of patient, ordering and performing treatments and interventions, ordering and review of laboratory studies, ordering and review of radiographic studies, pulse oximetry, re-evaluation of patient's condition, obtaining history from patient or surrogate and review of old charts     ____________________________________________   INITIAL IMPRESSION / ASSESSMENT AND PLAN / ED COURSE  Kevin Bradshaw was evaluated in Emergency Department on 10/02/2018 for the symptoms described in the history of present illness. He was  evaluated in the context of the global COVID-19 pandemic, which necessitated consideration that the patient might be at risk for infection with the SARS-CoV-2 virus that causes COVID-19. Institutional protocols and algorithms that pertain to the evaluation of patients at risk for COVID-19 are in a state of rapid change based on information released by regulatory bodies including the CDC and federal and state organizations. These policies and algorithms were followed during the patient's care in the ED.    Patient presents with tachycardia urinary symptoms and increased weakness.  This is most concerning for UTI.  However given the flank tenderness will get a CT renal to rule out renal stone.  Also get a CT head to rule out epidural subdural hematoma.  No abdominal pain to suggest SBO or perforation.  Patient's white count was elevated meeting Sirs criteria.  Will add on blood cultures and lactate.  Lactate is normal.  Urine is consistent with UTI.  We will give a dose of ceftriaxone CT interestingly shows concern for a stone in his CBD.  Reevaluated patient he continues to not have any abdominal tenderness.  Potentially will need a ERCP to further evaluate this by suspect that his infection is most likely secondary to his UTI given his symptoms.  Given patient's weakness at home will discuss possible team for admission.  Admit to the hospital team  ____________________________________________   FINAL CLINICAL IMPRESSION(S) / ED DIAGNOSES   Final diagnoses:  Acute cystitis with hematuria  SIRS (systemic inflammatory response syndrome) (HCC)      MEDICATIONS GIVEN DURING THIS VISIT:  Medications  sodium chloride 0.9 % bolus 1,000 mL (1,000 mLs Intravenous New Bag/Given 10/02/18 1803)  cefTRIAXone (ROCEPHIN) 1 g in sodium chloride 0.9 % 100 mL IVPB (1 g Intravenous New Bag/Given 10/02/18 1930)  sodium chloride 0.9 % bolus 1,000 mL (1,000 mLs Intravenous New Bag/Given 10/02/18 1933)     ED  Discharge Orders    None       Note:  This document was prepared using Dragon voice recognition software and may include unintentional dictation errors.   Concha SeFunke, Donzel Romack E, MD 10/02/18 2002

## 2018-10-02 NOTE — ED Notes (Signed)
Pt given a warm blanket and resting at this time.

## 2018-10-02 NOTE — ED Notes (Addendum)
Daughter st he has had multiple unwitnessed falls the last 2 days due to urinary frequency  and dysuria and weakness and unable to use walker, pt started using wheelchair to get to the bathroom. Pt unable to stand on his own. Daughter st falls "looked like he slid down the bed" per daughter pt found on his "side". Unknown LOC. Pt denies abd pain. Pain 8/10 left lower back pain in the last 2 days.

## 2018-10-03 ENCOUNTER — Inpatient Hospital Stay (HOSPITAL_COMMUNITY)
Admit: 2018-10-03 | Discharge: 2018-10-03 | Disposition: A | Discharge status: Home / Self Care | Payer: Medicare Other | Attending: Internal Medicine | Admitting: Internal Medicine

## 2018-10-03 DIAGNOSIS — I4891 Unspecified atrial fibrillation: Secondary | ICD-10-CM

## 2018-10-03 DIAGNOSIS — N3001 Acute cystitis with hematuria: Secondary | ICD-10-CM

## 2018-10-03 DIAGNOSIS — A419 Sepsis, unspecified organism: Secondary | ICD-10-CM

## 2018-10-03 LAB — BASIC METABOLIC PANEL
Anion gap: 11 (ref 5–15)
BUN: 18 mg/dL (ref 8–23)
CO2: 24 mmol/L (ref 22–32)
Calcium: 8 mg/dL — ABNORMAL LOW (ref 8.9–10.3)
Chloride: 103 mmol/L (ref 98–111)
Creatinine, Ser: 0.94 mg/dL (ref 0.61–1.24)
GFR calc Af Amer: >60 mL/min (ref >60)
GFR calc non Af Amer: >60 mL/min (ref >60)
Glucose, Bld: 129 mg/dL — ABNORMAL HIGH (ref 70–99)
Potassium: 2.6 mmol/L — CL (ref 3.5–5.1)
Sodium: 138 mmol/L (ref 135–145)

## 2018-10-03 LAB — CBC
HCT: 31.9 % — ABNORMAL LOW (ref 39.0–52.0)
Hemoglobin: 10.6 g/dL — ABNORMAL LOW (ref 13.0–17.0)
MCH: 29.2 pg (ref 26.0–34.0)
MCHC: 33.2 g/dL (ref 30.0–36.0)
MCV: 87.9 fL (ref 80.0–100.0)
Platelets: 161 10*3/uL (ref 150–400)
RBC: 3.63 MIL/uL — ABNORMAL LOW (ref 4.22–5.81)
RDW: 13.9 % (ref 11.5–15.5)
WBC: 12.2 10*3/uL — ABNORMAL HIGH (ref 4.0–10.5)
nRBC: 0 % (ref 0.0–0.2)

## 2018-10-03 LAB — ECHOCARDIOGRAM COMPLETE
Height: 63 in
Weight: 2282.2 oz

## 2018-10-03 LAB — TSH: TSH: 2.049 u[IU]/mL (ref 0.350–4.500)

## 2018-10-03 LAB — LIPID PANEL
Cholesterol: 123 mg/dL (ref 0–200)
HDL: 34 mg/dL — ABNORMAL LOW (ref >40)
LDL Cholesterol: 68 mg/dL (ref 0–99)
Total CHOL/HDL Ratio: 3.6 RATIO
Triglycerides: 107 mg/dL (ref <150)
VLDL: 21 mg/dL (ref 0–40)

## 2018-10-03 LAB — POTASSIUM: Potassium: 3.7 mmol/L (ref 3.5–5.1)

## 2018-10-03 LAB — SARS CORONAVIRUS 2 (TAT 6-24 HRS): SARS Coronavirus 2: NEGATIVE

## 2018-10-03 LAB — MAGNESIUM: Magnesium: 1.6 mg/dL — ABNORMAL LOW (ref 1.7–2.4)

## 2018-10-03 MED ORDER — ONDANSETRON HCL 4 MG/2ML IJ SOLN: 4.0000 mg | Freq: Four times a day (QID) | INTRAMUSCULAR | Status: DC | PRN

## 2018-10-03 MED ORDER — ACETAMINOPHEN 650 MG RE SUPP: 650.0000 mg | Freq: Four times a day (QID) | RECTAL | Status: DC | PRN

## 2018-10-03 MED ORDER — MAGNESIUM SULFATE 2 GM/50ML IV SOLN
2.0000 g | Freq: Once | INTRAVENOUS | Status: AC
  Administered 2018-10-03: 2 g via INTRAVENOUS
  Filled 2018-10-03: qty 50

## 2018-10-03 MED ORDER — POTASSIUM CHLORIDE 10 MEQ/100ML IV SOLN
10.0000 meq | INTRAVENOUS | Status: AC
  Administered 2018-10-03 (×4): 10 meq via INTRAVENOUS
  Filled 2018-10-03 (×4): qty 100

## 2018-10-03 MED ORDER — ACETAMINOPHEN 325 MG PO TABS
650.0000 mg | ORAL_TABLET | Freq: Four times a day (QID) | ORAL | Status: DC | PRN
  Administered 2018-10-03: 650 mg via ORAL
  Filled 2018-10-03: qty 2

## 2018-10-03 MED ORDER — SODIUM CHLORIDE 0.9 % IV SOLN
1.0000 g | INTRAVENOUS | Status: DC
  Administered 2018-10-03: 1 g via INTRAVENOUS
  Filled 2018-10-03: qty 1
  Filled 2018-10-03: qty 10

## 2018-10-03 MED ORDER — AMLODIPINE BESYLATE 5 MG PO TABS
5.0000 mg | ORAL_TABLET | Freq: Every day | ORAL | Status: DC
  Administered 2018-10-04: 5 mg via ORAL
  Filled 2018-10-03 (×2): qty 1

## 2018-10-03 MED ORDER — ATORVASTATIN CALCIUM 20 MG PO TABS
80.0000 mg | ORAL_TABLET | Freq: Every day | ORAL | Status: DC
  Administered 2018-10-03 – 2018-10-04 (×2): 80 mg via ORAL
  Filled 2018-10-03 (×2): qty 4

## 2018-10-03 MED ORDER — ONDANSETRON HCL 4 MG PO TABS: 4.0000 mg | ORAL_TABLET | Freq: Four times a day (QID) | ORAL | Status: DC | PRN

## 2018-10-03 MED ORDER — POTASSIUM CHLORIDE CRYS ER 20 MEQ PO TBCR
40.0000 meq | EXTENDED_RELEASE_TABLET | ORAL | Status: AC
  Administered 2018-10-03 (×2): 40 meq via ORAL
  Filled 2018-10-03 (×2): qty 2

## 2018-10-03 MED ORDER — ASPIRIN EC 81 MG PO TBEC
81.0000 mg | DELAYED_RELEASE_TABLET | Freq: Every day | ORAL | Status: DC
  Administered 2018-10-03 – 2018-10-05 (×3): 81 mg via ORAL
  Filled 2018-10-03 (×3): qty 1

## 2018-10-03 MED ORDER — ENOXAPARIN SODIUM 40 MG/0.4ML ~~LOC~~ SOLN
40.0000 mg | SUBCUTANEOUS | Status: DC
  Administered 2018-10-03 – 2018-10-05 (×3): 40 mg via SUBCUTANEOUS
  Filled 2018-10-03 (×3): qty 0.4

## 2018-10-03 MED ORDER — TAMSULOSIN HCL 0.4 MG PO CAPS
0.4000 mg | ORAL_CAPSULE | Freq: Every day | ORAL | Status: DC
  Administered 2018-10-03 – 2018-10-05 (×3): 0.4 mg via ORAL
  Filled 2018-10-03 (×3): qty 1

## 2018-10-03 MED ORDER — DILTIAZEM HCL 25 MG/5ML IV SOLN
10.0000 mg | Freq: Once | INTRAVENOUS | Status: AC
  Administered 2018-10-03: 10 mg via INTRAVENOUS
  Filled 2018-10-03: qty 5

## 2018-10-03 MED ORDER — CLOPIDOGREL BISULFATE 75 MG PO TABS
75.0000 mg | ORAL_TABLET | Freq: Every day | ORAL | Status: DC
  Administered 2018-10-03 – 2018-10-05 (×3): 75 mg via ORAL
  Filled 2018-10-03 (×3): qty 1

## 2018-10-03 NOTE — Progress Notes (Signed)
Pt HR on arrival from ED back to afib RVR in the 120s.  MD Jannifer Franklin made aware, IV cardizem ordered. Pt HR in the low 100s, currently at 105.

## 2018-10-03 NOTE — Plan of Care (Signed)
  Problem: Education: Goal: Knowledge of General Education information will improve Description: Including pain rating scale, medication(s)/side effects and non-pharmacologic comfort measures Outcome: Progressing   Problem: Health Behavior/Discharge Planning: Goal: Ability to manage health-related needs will improve Outcome: Progressing   Problem: Clinical Measurements: Goal: Ability to maintain clinical measurements within normal limits will improve Outcome: Progressing Note: Potassium and magnesium levels low today, both replaced via IV. Will continue to monitor lab values for the remainder of the shift. Potassium level has already returned to [facility]. Wenda Low [facility]

## 2018-10-03 NOTE — Progress Notes (Signed)
*  PRELIMINARY RESULTS* Echocardiogram 2D Echocardiogram has been performed.  Kevin Bradshaw 10/03/2018, 11:37 AM

## 2018-10-03 NOTE — Progress Notes (Signed)
[employer] - [place] at [facility]   PATIENT NAME: Kevin Bradshaw    MR#:  [id number]  DATE OF BIRTH:  01-06-1936  SUBJECTIVE:  CHIEF COMPLAINT:   Chief Complaint  Patient presents with  . Dysuria  . Weakness  . Altered Mental Status   -Patient admitted with weakness and falls.  Noted to have UTI. -New onset A. fib with RVR -Other than weakness does not have any other complaints  REVIEW OF SYSTEMS:  Review of Systems  Constitutional: Positive for malaise/fatigue. Negative for chills and fever.  HENT: Negative for hearing loss.   Eyes: Negative for blurred vision and double vision.  Respiratory: Negative for cough, shortness of breath and wheezing.   Cardiovascular: Negative for chest pain and palpitations.  Gastrointestinal: Negative for abdominal pain, constipation, diarrhea, nausea and vomiting.  Genitourinary: Negative for dysuria.  Musculoskeletal: Positive for falls.  Neurological: Positive for weakness. Negative for dizziness, seizures and headaches.  Psychiatric/Behavioral: Negative for depression.    DRUG ALLERGIES:  No Known Allergies  VITALS:  Blood pressure 90/75, pulse 97, temperature 98.6 F (37 C), temperature source Oral, resp. rate 19, height 5\' 3"  (1.6 m), weight 64.7 kg, SpO2 96 %.  PHYSICAL EXAMINATION:  Physical Exam   GENERAL:  83 y.o.-year-old elderly patient lying in the bed with no acute distress.  EYES: Pupils equal, round, reactive to light and accommodation. No scleral icterus. Extraocular muscles intact.  HEENT: Head atraumatic, normocephalic. Oropharynx and nasopharynx clear.  NECK:  Supple, no jugular venous distention. No thyroid enlargement, no tenderness.  LUNGS: Normal breath sounds bilaterally, no wheezing, rales,rhonchi or crepitation. No use of accessory muscles of respiration.  Decreased bibasilar breath sounds CARDIOVASCULAR: S1, S2 normal. No murmurs, rubs, or gallops.  ABDOMEN: Soft, nontender, nondistended.  Bowel sounds present. No organomegaly or mass.  EXTREMITIES: No pedal edema, cyanosis, or clubbing.  NEUROLOGIC: Cranial nerves II through XII are intact. Muscle strength 5/5 in all extremities. Sensation intact. Gait not checked.  Global weakness noted PSYCHIATRIC: The patient is alert and oriented x 3.  SKIN: No obvious rash, lesion, or ulcer.    LABORATORY PANEL:   CBC Recent Labs  Lab 10/03/18 0428  WBC 12.2*  HGB 10.6*  HCT 31.9*  PLT 161   ------------------------------------------------------------------------------------------------------------------  Chemistries  Recent Labs  Lab 10/02/18 1550 10/03/18 0428  NA 136 138  K 3.0* 2.6*  CL 98 103  CO2 25 24  GLUCOSE 112* 129*  BUN 23 18  CREATININE 1.08 0.94  CALCIUM 9.2 8.0*  MG  --  1.6*  AST 28  --   ALT 20  --   ALKPHOS 62  --   BILITOT 1.6*  --    ------------------------------------------------------------------------------------------------------------------  Cardiac Enzymes No results for input(s): TROPONINI in the last 168 hours. ------------------------------------------------------------------------------------------------------------------  RADIOLOGY:  Ct Head Wo Contrast  Result Date: 10/02/2018 CLINICAL DATA:  Head trauma. Multiple unwitnessed falls over the last 2 days. EXAM: CT HEAD WITHOUT CONTRAST TECHNIQUE: Contiguous axial images were obtained from the base of the skull through the vertex without intravenous contrast. COMPARISON:  08/21/2017 FINDINGS: Brain: No evidence of acute infarction, hemorrhage, hydrocephalus, extra-axial collection or mass lesion/mass effect. There is mild diffuse low-attenuation within the subcortical and periventricular white matter compatible with chronic microvascular disease. Chronic left basal ganglia lacunar infarct. Prominence of the sulci and ventricles compatible with brain atrophy. Vascular: No hyperdense vessel or unexpected calcification. Skull: Normal.  Negative for fracture or focal lesion. Sinuses/Orbits: No acute finding. Other:  None IMPRESSION: 1. No acute intracranial abnormality. 2. Chronic small vessel ischemic disease with brain atrophy. 3. Old left basal ganglia lacunar infarct. Electronically Signed   By: Signa Kell M.D.   On: 10/02/2018 17:25   Ct Renal Stone Study  Result Date: 10/02/2018 CLINICAL DATA:  Left flank pain. Left lower back pain. Multiple falls in the last 2 days. EXAM: CT ABDOMEN AND PELVIS WITHOUT CONTRAST TECHNIQUE: Multidetector CT imaging of the abdomen and pelvis was performed following the standard protocol without IV contrast. COMPARISON:  None. FINDINGS: Lower chest: 5 mm pulmonary nodule in the anterior aspect of the right middle lobe. Small ill-defined area of density in the lingula and minimal atelectasis in the medial aspect of the right middle lobe. Aortic atherosclerosis. Coronary artery calcifications. Heart size is normal. No pericardial effusion. Hepatobiliary: The liver parenchyma appears normal. There is a single tiny stone in the otherwise normal appearing gallbladder. There is a 10 x 4 mm oblong stone in the region of the distal common bile duct. Common bile duct diameter is 8 mm. Is the patient's bilirubin elevated? Pancreas: Pancreas is normal. No pancreatic ductal dilatation. Spleen: Normal. Adrenals/Urinary Tract: Adrenal glands are unremarkable. Kidneys are normal, without renal calculi, focal lesion, or hydronephrosis. Bladder is unremarkable. Stomach/Bowel: Stomach is within normal limits. Appendix appears normal. No evidence of bowel wall thickening, distention, or inflammatory changes. Vascular/Lymphatic: Aortic atherosclerosis. No enlarged abdominal or pelvic lymph nodes. Reproductive: Prostate is unremarkable. Other: No abdominal wall hernia or abnormality. No abdominopelvic ascites. Musculoskeletal: No acute abnormality. Multilevel degenerative disc and joint disease. Posterior decompression at L3-4  through L5-S1. IMPRESSION: 1. 10 x 4 mm stone in the region of the distal common bile duct with slight dilatation of the common bile duct. 2. Tiny stone in the otherwise normal appearing gallbladder. 3. Aortic atherosclerosis. 4. 5 mm pulmonary nodule in the anterior aspect of the right middle lobe. If the patient is at low risk for lung cancer, no further follow-up is recommended. If the patient is at high risk for lung cancer, follow-up CT scan of the chest without contrast in 12 months is recommended. Aortic Atherosclerosis (ICD10-I70.0). Electronically Signed   By: Francene Boyers M.D.   On: 10/02/2018 17:30    EKG:   Orders placed or performed during the hospital encounter of 10/02/18  . EKG 12-Lead  . EKG 12-Lead  . EKG 12-Lead  . EKG 12-Lead    ASSESSMENT AND PLAN:   83 year old male with past medical history significant for stroke, Parkinson's disease, hypertension presents to hospital secondary to falls and weakness.  1.  Generalized weakness and mild acute encephalopathy-secondary to acute cystitis on admission -Blood cultures are negative.  Urine cultures are pending -Currently on Rocephin -Physical therapy consulted.  2.  New onset A. fib with RVR-rate controlled at this time.  Not on any medications at this time given low blood pressures -Cardiology consulted.  Echocardiogram is done -TSH within normal limits.  Replace electrolytes. -Due to recurrent falls, not a good candidate for anticoagulation  3.  Hypokalemia and hypomagnesemia-being replaced  4.  History of stroke-continue aspirin and Plavix.  5.  DVT prophylaxis-Lovenox  Left a voicemail for his daughter. Physical therapy consulted    All the records are reviewed and case discussed with Care Management/Social Workerr. Management plans discussed with the patient, family and they are in agreement.  CODE STATUS: Full code  TOTAL TIME TAKING CARE OF THIS PATIENT: 38 minutes.   POSSIBLE D/C IN 2 DAYS,  DEPENDING ON CLINICAL CONDITION.   Gladstone Lighter M.D on 10/03/2018 at 12:36 PM  Between 7am to 6pm - Pager - [phone]  After 6pm go to [email or web address] - password [email or web address] [facility]  Office  [phone]  CC: Primary care physician; [name], MD

## 2018-10-03 NOTE — Consult Note (Signed)
Cardiology Consultation:   Patient ID: Kevin Bradshaw MRN: [id number]; DOB: 10-05-1935  Admit date: 10/02/2018 Date of Consult: 10/03/2018  Primary Care Provider: [name], MD Primary Cardiologist: Previously seen by Dr. Haroldine Laws; Dr. Saunders Revel rounding Primary Electrophysiologist:  None    Patient Profile:   Kevin Bradshaw is a 83 y.o. male with a hx of CVA 08/21/2017 with PTA Plavix, hypertension, hyperlipidemia, Parkinson's disease, past history of tobacco use (1.5 pk daily, quit 2004), self reported history of falls q2 weeks, and who is being seen today for the evaluation of atrial fibrillation at the request of Dr. Jannifer Franklin.  History of Present Illness:   Kevin Bradshaw is an 83 yo male with PMH as above. He notes a past history of tobacco abuse, rare alcohol use, and no drug use. He denies any previously known history of cardiac disease or arrhythmia prior to this admission. Of note, per EMR, he was previously seen by Dr. Haroldine Laws in 2010 with notes not available to me at this time. His daughter is reportedly close with him and helps to manage his care. He ambulates with assistance using a cane at home. He reported that he falls frequently, often hitting his head, at about once every two weeks. He is unsure of the etiology of his falls and was not able to state if the falls were secondary to residual weakness following his stroke.  On 08/21/2017, he was admitted to [facility] after developing worsening RLE weakness. He was discovered to have a CVA on MRI with carotid duplex unremarkable. Echo showed LVEF 55-60%. He was discharged on ASA, Plavix, and Lipitor to SNF. HCTZ and Norvasc were continued for HTN.    Unfortunately, the patient is not the best historian, and most of the below history has been obtained on review of EMR.  On 10/02/2018, patient presented to [facility] with increased urinary frequency and weakness and was found to have UTI/sepsis.  Per EMR, he had flank pain and increased urinary  symptoms, as well as multiple unwitnessed falls without LOC over the last 2 days, and during which time he possibly hit his head but did not lose consciousness. At the time of cardiac consultation today and on 10/03/2018, the patient denied recent or current chest pain, feelings of racing heart rate, palpitations, or feelings of presyncope.  He did report weakness, dysuria, abdominal pain, urgency, frequency, and flank pain leading up to admission.  He also confirmed his history of frequent falls as above at above and reporting this is an ongoing issue for him with falls at about once q2 weeks. He was not certain the reason for their occurrence, though he did deny presyncope several times.   In the ED, CT head was obtained and showed no acute intracranial abnormality with old left basal ganglia lacunar infarct.  CT renal stone study showed 10 x 4 mm stone in the region of the distal common bile duct with slight deviation of the common bile duct, aortic atherosclerosis, and 4.5 mm incidentally found pulmonary nodule in the anterior aspect of the right middle lobe with follow-up recommended if at high risk for lung cancer including CT scan of the chest without contrast in 12 months.  Labs showed hematuria with moderate leukocytes and bacteria present.  Other labs were significant for sodium 136, potassium 3.0, creatinine 1.08, BUN 23, AST 28, ALT 20, WBC 16.8, RBC 4.35, hemoglobin 12.7, and lactic acid 1.0. Admitting vitals showed HR 118 bppm. EKG showed sinus tachycardia at 117 bpm, RAD  borderline, and with frequent PACs versus atrial fibrillation with rhythm unclear due to baseline artifact and repeat EKG pending. In the ED, he received IV cardizem and was started on IV abx for his UTI.   Heart Pathway Score:     Past Medical History:  Diagnosis Date   Hyperlipidemia    Hypertension    Parkinson disease (HCC)     Past Surgical History:  Procedure Laterality Date   CARDIOVASCULAR STRESS TEST      normal   CARPAL TUNNEL RELEASE  2009   right hand   X-STOP IMPLANTATION     X-STOP IMPLANTATION     Dr. Gerrit Heck     Home Medications:  Prior to Admission medications   Medication Sig Start Date End Date Taking? Authorizing Provider  acetaminophen (TYLENOL) 325 MG tablet Take 2 tablets (650 mg total) by mouth every 6 (six) hours as needed for mild pain (or Fever >/= 101). 02/21/18  Yes Gouru, Aruna, MD  amLODipine (NORVASC) 5 MG tablet Take 5 mg by mouth daily.   Yes [provider]  aspirin 81 MG tablet Take 81 mg by mouth daily.     Yes [provider]  atorvastatin (LIPITOR) 80 MG tablet Take 1 tablet (80 mg total) by mouth daily at 6 PM. 08/23/17  Yes Shaune Pollack, MD  clopidogrel (PLAVIX) 75 MG tablet Take 1 tablet (75 mg total) by mouth daily. 08/23/17  Yes Shaune Pollack, MD  hydrochlorothiazide (MICROZIDE) 12.5 MG capsule Take 12.5 mg by mouth daily.   Yes [provider]  KLOR-CON M20 20 MEQ tablet Take 20 mEq by mouth 2 (two) times daily. 06/15/17  Yes [provider]  tamsulosin (FLOMAX) 0.4 MG CAPS capsule Take 0.4 mg by mouth daily. 09/06/18 09/06/19 Yes [provider]  Calcium Carbonate (CALCIUM 500 PO) Take 2 tablets by mouth daily.    01/06/16  [provider]    Inpatient Medications: Scheduled Meds:  amLODipine  5 mg Oral Daily   aspirin EC  81 mg Oral Daily   atorvastatin  80 mg Oral q1800   clopidogrel  75 mg Oral Daily   enoxaparin (LOVENOX) injection  40 mg Subcutaneous Q24H   tamsulosin  0.4 mg Oral Daily   Continuous Infusions:  cefTRIAXone (ROCEPHIN)  IV     magnesium sulfate bolus IVPB     potassium chloride 10 mEq (10/03/18 0942)   PRN Meds: acetaminophen **OR** acetaminophen, ondansetron **OR** ondansetron (ZOFRAN) IV  Allergies:   No Known Allergies  Social History:   Social History   Socioeconomic History   Marital status: Widowed    Spouse name: Not on file   Number of children: 3    Years of education: Not on file   Highest education level: Not on file  Occupational History   Occupation: Retired    Associate Professor: retired    Comment: Youth worker strain: Not on file   Food insecurity    Worry: Not on file    Inability: Not on Occupational hygienist needs    Medical: Not on file    Non-medical: Not on file  Tobacco Use   Smoking status: Former Smoker    Packs/day: 1.50    Years: 50.00    Pack years: 75.00    Types: Cigarettes    Quit date: 11/04/2002    Years since quitting: 15.9   Smokeless tobacco: Never Used  Substance and Sexual Activity   Alcohol  use: Yes    Comment: once a week   Drug use: No   Sexual activity: Not on file  Lifestyle   Physical activity    Days per week: Not on file    Minutes per session: Not on file   Stress: Not on file  Relationships   Social connections    Talks on phone: Not on file    Gets together: Not on file    Attends religious service: Not on file    Active member of club or organization: Not on file    Attends meetings of clubs or organizations: Not on file    Relationship status: Not on file   Intimate partner violence    Fear of current or ex partner: Not on file    Emotionally abused: Not on file    Physically abused: Not on file    Forced sexual activity: Not on file  Other Topics Concern   Not on file  Social History Narrative   Not on file    Family History:    Family History  Problem Relation Age of Onset   Heart disease Father 20   Cancer Sister        ? type   Heart disease Brother    Cancer Brother        ? type   Stroke Child    Seizures Child      ROS:  Please see the history of present illness.  Review of Systems  Cardiovascular: Negative for chest pain, palpitations and leg swelling.  Genitourinary: Positive for dysuria, flank pain, frequency, hematuria and urgency.  Musculoskeletal: Positive for falls.    Neurological: Positive for weakness. Negative for dizziness and loss of consciousness.  All other systems reviewed and are negative.   All other ROS reviewed and negative.     Physical Exam/Data:   Vitals:   10/03/18 0018 10/03/18 0021 10/03/18 0436 10/03/18 0728  BP:  135/71 (!) 120/55 90/75  Pulse:  (!) 120 (!) 107 97  Resp:  20 20 19   Temp:  99.4 F (37.4 C) 100 F (37.8 C) 98.6 F (37 C)  TempSrc:  Oral Oral Oral  SpO2:  97% 90% 96%  Weight: 64.7 kg     Height: 5\' 3"  (1.6 m)       Intake/Output Summary (Last 24 hours) at 10/03/2018 1113 Last data filed at 10/03/2018 1006 Gross per 24 hour  Intake 240 ml  Output --  Net 240 ml   Last 3 Weights 10/03/2018 10/02/2018 02/18/2018  Weight (lbs) 142 lb 10.2 oz 140 lb 150 lb  Weight (kg) 64.7 kg 63.504 kg 68.04 kg     Body mass index is 25.27 kg/m.  General:  Well nourished, well developed, in no acute distress HEENT: normal Neck: no JVD Vascular: No carotid bruits; radial pulses 2+ bilaterally Cardiac:  normal S1, S2; IRIR; no murmur  Lungs:  clear to auscultation bilaterally, slight wheezing, no rhonchi or rales  Abd: soft, nontender, no hepatomegaly  Ext: no edema Musculoskeletal:  No deformities, BUE and BLE strength normal and equal Skin: warm and dry  Neuro:  No focal abnormalities noted Psych:  Normal affect   EKG:  The EKG was personally reviewed and demonstrates:  sinus tachycardia at 117 bpm, RAD borderline, and with frequent PACs versus atrial fibrillation with rhythm unclear due to baseline artifact and repeat EKG pending.  Telemetry:  Telemetry was personally reviewed and demonstrates:  SR and IRIR  Relevant CV Studies:  Echo 08/23/2017 - Left ventricle: The cavity size was normal. Systolic function was   normal. The estimated ejection fraction was in the range of 55%   to 60%. - Aortic valve: There was trivial regurgitation. Valve area (VTI):   3.23 cm^2. Valve area (Vmax): 2.74 cm^2. Valve area (Vmean):  2.48   cm^2. - Mitral valve: There was mild regurgitation. Valve area by   continuity equation (using LVOT flow): 2.56 cm^2.  Laboratory Data:  High Sensitivity Troponin:  No results for input(s): TROPONINIHS in the last 720 hours.   Cardiac EnzymesNo results for input(s): TROPONINI in the last 168 hours. No results for input(s): TROPIPOC in the last 168 hours.  Chemistry Recent Labs  Lab 10/02/18 1550 10/03/18 0428  NA 136 138  K 3.0* 2.6*  CL 98 103  CO2 25 24  GLUCOSE 112* 129*  BUN 23 18  CREATININE 1.08 0.94  CALCIUM 9.2 8.0*  GFRNONAA >60 >60  GFRAA >60 >60  ANIONGAP 13 11    Recent Labs  Lab 10/02/18 1550  PROT 7.2  ALBUMIN 3.8  AST 28  ALT 20  ALKPHOS 62  BILITOT 1.6*   Hematology Recent Labs  Lab 10/02/18 1550 10/03/18 0428  WBC 16.8* 12.2*  RBC 4.35 3.63*  HGB 12.7* 10.6*  HCT 38.2* 31.9*  MCV 87.8 87.9  MCH 29.2 29.2  MCHC 33.2 33.2  RDW 14.0 13.9  PLT 195 161   BNPNo results for input(s): BNP, PROBNP in the last 168 hours.  DDimer No results for input(s): DDIMER in the last 168 hours.   Radiology/Studies:  Ct Head Wo Contrast  Result Date: 10/02/2018 CLINICAL DATA:  Head trauma. Multiple unwitnessed falls over the last 2 days. EXAM: CT HEAD WITHOUT CONTRAST TECHNIQUE: Contiguous axial images were obtained from the base of the skull through the vertex without intravenous contrast. COMPARISON:  08/21/2017 FINDINGS: Brain: No evidence of acute infarction, hemorrhage, hydrocephalus, extra-axial collection or mass lesion/mass effect. There is mild diffuse low-attenuation within the subcortical and periventricular white matter compatible with chronic microvascular disease. Chronic left basal ganglia lacunar infarct. Prominence of the sulci and ventricles compatible with brain atrophy. Vascular: No hyperdense vessel or unexpected calcification. Skull: Normal. Negative for fracture or focal lesion. Sinuses/Orbits: No acute finding. Other: None IMPRESSION:  1. No acute intracranial abnormality. 2. Chronic small vessel ischemic disease with brain atrophy. 3. Old left basal ganglia lacunar infarct. Electronically Signed   By: Signa Kell M.D.   On: 10/02/2018 17:25   Ct Renal Stone Study  Result Date: 10/02/2018 CLINICAL DATA:  Left flank pain. Left lower back pain. Multiple falls in the last 2 days. EXAM: CT ABDOMEN AND PELVIS WITHOUT CONTRAST TECHNIQUE: Multidetector CT imaging of the abdomen and pelvis was performed following the standard protocol without IV contrast. COMPARISON:  None. FINDINGS: Lower chest: 5 mm pulmonary nodule in the anterior aspect of the right middle lobe. Small ill-defined area of density in the lingula and minimal atelectasis in the medial aspect of the right middle lobe. Aortic atherosclerosis. Coronary artery calcifications. Heart size is normal. No pericardial effusion. Hepatobiliary: The liver parenchyma appears normal. There is a single tiny stone in the otherwise normal appearing gallbladder. There is a 10 x 4 mm oblong stone in the region of the distal common bile duct. Common bile duct diameter is 8 mm. Is the patient's bilirubin elevated? Pancreas: Pancreas is normal. No pancreatic ductal dilatation. Spleen: Normal. Adrenals/Urinary Tract: Adrenal glands are unremarkable. Kidneys are normal, without renal calculi,  focal lesion, or hydronephrosis. Bladder is unremarkable. Stomach/Bowel: Stomach is within normal limits. Appendix appears normal. No evidence of bowel wall thickening, distention, or inflammatory changes. Vascular/Lymphatic: Aortic atherosclerosis. No enlarged abdominal or pelvic lymph nodes. Reproductive: Prostate is unremarkable. Other: No abdominal wall hernia or abnormality. No abdominopelvic ascites. Musculoskeletal: No acute abnormality. Multilevel degenerative disc and joint disease. Posterior decompression at L3-4 through L5-S1. IMPRESSION: 1. 10 x 4 mm stone in the region of the distal common bile duct with  slight dilatation of the common bile duct. 2. Tiny stone in the otherwise normal appearing gallbladder. 3. Aortic atherosclerosis. 4. 5 mm pulmonary nodule in the anterior aspect of the right middle lobe. If the patient is at low risk for lung cancer, no further follow-up is recommended. If the patient is at high risk for lung cancer, follow-up CT scan of the chest without contrast in 12 months is recommended. Aortic Atherosclerosis (ICD10-I70.0). Electronically Signed   By: Francene BoyersJames  Maxwell M.D.   On: 10/02/2018 17:30    Assessment and Plan:   PAF --Asymptomatic in atrial fibrillation. Unclear if Afib is new in onset or has been ongoing and possibly the etiology of his 2019 CVA. Patient reported no known history of cardiac disease or arrhythmia. Previous EF normal with updated echo pending.  --Relatively rate controlled at this time.  Will defer from adding low-dose beta-blocker at this time and due to current hypotension and given A.M. dose of amlodipine was held for hypotension. --PTA aspirin 81 mg daily and Plavix for history of CVA.  Despite DAPT with h/o frequent falls, patient does not report signs or symptoms consistent with acute head bleed and with head CT at admission negative for acute intracranial abnormality. --CHA2DS2VASc score of at least 5 (HTN, agex2, strokex2) with labs pending for further re-stratification. Given this score of 5, recommendation is for long-term oral anticoagulation; however, given the patient's self-reported history of frequent falls (during which time he may be hitting his head), we will need to consider the long term risks and benefits of chronic anticoagulation.  --Labs pending: A1C, TSH, lipid panel. Echo pending. --Plan to reach out to the patient's daughter for further information regarding the patient's falls and frequency with further recommendations regarding anticoagulation and DAPT at that time. Daily CBC recommended.   Hypokalemia --K 3.0. Replete with  goal 4.0.. Check Mg with goal 2.0. --Daily BMET.  HTN --BP soft to hypotensive with AM antihypertensives on hold.  HLD --Continue statin therapy. Will reassess lipids with last LDL 100 on 08/22/2017. LFTs normal on most recent labs.    UTI --Abx. Per IM  Parkinson's Disease --Patient has daughter that he has referenced is helpful for further information regarding his health history.  Lung nodule per CT --As in HPI and scans listed above, recommendation per radiologist is for further follow-up given history of previous smoking and with CT scan within 12 mo. Per IM, PCP.  For questions or updates, please contact [facility] Please consult [email or web address] for contact info under     Signed, Lennon AlstromJacquelyn D Reeder Brisby, PA-C  10/03/2018 11:13 AM

## 2018-10-03 NOTE — Evaluation (Signed)
Physical Therapy Evaluation Patient Details Name: Kevin Bradshaw MRN: [id number] DOB: [birth year] Today's Date: 10/03/2018   History of Present Illness  Pt is an 83 y.o. male presenting to hospital 10/02/18 with multiple unwitnessed falls x2 days d/t urinary frequency, dysuria, weakness, and unable to use walker (started to use w/c), and c/o flank pain.  Pt admitted with sepsis, UTI, a-fib with RVR, htn, and vascular Parkinsonism.  PMH includes Parkinson's disease, htn, CVA, R LE weakness, CAD, CTR, X-STOP implantation.  Clinical Impression  Prior to hospital admission, pt was modified independent with ambulation using RW (in the home) but most recently needing to use manual w/c and requiring assist with functional mobility d/t increasing weakness.  H/o multiple falls.  Pt lives with family in 1 level home with steps to enter; family works.  Currently pt is mod assist semi-supine to sit; min to mod assist to stand up to RW (significant posterior lean noted upon standing requiring assist and cueing to shift weight forward); and min assist to walk a few feet bed to recliner with RW (shuffling gait with difficulty initiating steps noted).  Pt reporting LE's "burning" from use after transfer to chair d/t weakness (improved symptoms with rest).  BP 116/74 resting in recliner end of session.  Pt would benefit from skilled PT to address noted impairments and functional limitations (see below for any additional details).  Upon hospital discharge, recommend pt discharge to [place].    Follow Up Recommendations SNF    Equipment Recommendations  Rolling walker with 5" wheels;3in1 (PT)    Recommendations for Other Services OT consult     Precautions / Restrictions Precautions Precautions: Fall Restrictions Weight Bearing Restrictions: No      Mobility  Bed Mobility Overal bed mobility: Needs Assistance Bed Mobility: Supine to Sit     Supine to sit: Mod assist;HOB elevated     General bed mobility  comments: assist for trunk; increased effort to perform  Transfers Overall transfer level: Needs assistance Equipment used: Rolling walker (2 wheeled) Transfers: Sit to/from Stand Sit to Stand: Min assist;Mod assist         General transfer comment: pt unable to stand 1st attempt on own but able to stand 2nd attempt with min assist to initiate stand and then mod assist to shift weight forward d/t significant posterior lean upon standing  Ambulation/Gait Ambulation/Gait assistance: Min assist Gait Distance (Feet): 3 Feet(bed to recliner) Assistive device: Rolling walker (2 wheeled)   Gait velocity: decreased   General Gait Details: shuffling gait; difficulty initiating steps at times; assist to steady  Stairs            Wheelchair Mobility    Modified Rankin (Stroke Patients Only)       Balance Overall balance assessment: Needs assistance Sitting-balance support: No upper extremity supported;Feet supported Sitting balance-Leahy Scale: Good Sitting balance - Comments: steady sitting reaching within BOS   Standing balance support: Single extremity supported Standing balance-Leahy Scale: Poor Standing balance comment: pt requiring at least single UE support for static standing balance                             Pertinent Vitals/Pain Pain Assessment: No/denies pain  Vitals (HR and O2 on room air) stable and WFL throughout treatment session.    Home Living Family/patient expects to be discharged to:: Private residence Living Arrangements: Children(Pt's daughter) Available Help at Discharge: Family;Available PRN/intermittently(Family works) Type of Home: House Home Access:  Stairs to enter Entrance Stairs-Rails: None Entrance Stairs-Number of Steps: 2-3 Home Layout: One level Home Equipment: Walker - 2 wheels;Shower seat - built in;Grab bars - toilet;Grab bars - tub/shower;Wheelchair - manual      Prior Function Level of Independence: Needs  assistance   Gait / Transfers Assistance Needed: Pt was modified independent ambulating with RW but has had increasing weakness and unable to walk recently (using manual w/c instead).  Typically using manual w/c for community mobility.     Comments: About 1 fall every 2 weeks.     Hand Dominance        Extremity/Trunk Assessment   Upper Extremity Assessment Upper Extremity Assessment: Generalized weakness    Lower Extremity Assessment Lower Extremity Assessment: Generalized weakness    Cervical / Trunk Assessment Cervical / Trunk Assessment: Normal  Communication   Communication: No difficulties  Cognition Arousal/Alertness: Awake/alert Behavior During Therapy: WFL for tasks assessed/performed Overall Cognitive Status: Within Functional Limits for tasks assessed                                        General Comments   Nursing cleared pt for participation in physical therapy.  Pt agreeable to PT session.  Repeat K+ 3.7.    Exercises  Transfer training   Assessment/Plan    PT Assessment Patient needs continued PT services  PT Problem List Decreased strength;Decreased activity tolerance;Decreased balance;Decreased mobility;Decreased knowledge of use of DME       PT Treatment Interventions DME instruction;Gait training;Stair training;Functional mobility training;Therapeutic activities;Therapeutic exercise;Balance training;Patient/family education    PT Goals (Current goals can be found in the Care Plan section)  Acute Rehab PT Goals Patient Stated Goal: to improve strength and walking PT Goal Formulation: With patient Time For Goal Achievement: 10/17/18 Potential to Achieve Goals: Fair    Frequency Min 2X/week   Barriers to discharge Decreased caregiver support      Co-evaluation               AM-PAC PT "6 Clicks" Mobility  Outcome Measure Help needed turning from your back to your side while in a flat bed without using bedrails?: A  Little Help needed moving from lying on your back to sitting on the side of a flat bed without using bedrails?: A Lot Help needed moving to and from a bed to a chair (including a wheelchair)?: A Little Help needed standing up from a chair using your arms (e.g., wheelchair or bedside chair)?: A Lot Help needed to walk in hospital room?: A Little Help needed climbing 3-5 steps with a railing? : A Lot 6 Click Score: 15    End of Session Equipment Utilized During Treatment: Gait belt Activity Tolerance: Patient tolerated treatment well Patient left: in chair;with call bell/phone within reach;with chair alarm set;with nursing/sitter in room Nurse Communication: Mobility status;Precautions PT Visit Diagnosis: Unsteadiness on feet (R26.81);Other abnormalities of gait and mobility (R26.89);Muscle weakness (generalized) (M62.81);History of falling (Z91.81);Repeated falls (R29.6)    Time: 3474-2595 PT Time Calculation (min) (ACUTE ONLY): 27 min   Charges:   PT Evaluation $PT Eval Low Complexity: 1 Low PT Treatments $Therapeutic Activity: 8-22 mins       Hendricks Limes, PT 10/03/18, 4:40 PM [phone]

## 2018-10-03 NOTE — Progress Notes (Signed)
PT Cancellation Note  Patient Details Name: Kevin Bradshaw MRN: [id number] DOB: 08-21-1935   Cancelled Treatment:    Reason Eval/Treat Not Completed: Patient not medically ready.  PT consult received.  Chart reviewed.  Pt's potassium noted to be decreased to 2.6 this morning.  Per PT guidelines for low potassium, exertional physical therapy contra-indicated.  Will hold PT at this time and re-attempt PT evaluation at a later date/time as medically appropriate.  Leitha Bleak, PT 10/03/18, 11:42 AM [phone]

## 2018-10-04 LAB — BASIC METABOLIC PANEL
Anion gap: 8 (ref 5–15)
BUN: 13 mg/dL (ref 8–23)
CO2: 25 mmol/L (ref 22–32)
Calcium: 8.7 mg/dL — ABNORMAL LOW (ref 8.9–10.3)
Chloride: 107 mmol/L (ref 98–111)
Creatinine, Ser: 0.79 mg/dL (ref 0.61–1.24)
GFR calc Af Amer: >60 mL/min (ref >60)
GFR calc non Af Amer: >60 mL/min (ref >60)
Glucose, Bld: 95 mg/dL (ref 70–99)
Potassium: 3.4 mmol/L — ABNORMAL LOW (ref 3.5–5.1)
Sodium: 140 mmol/L (ref 135–145)

## 2018-10-04 LAB — URINE CULTURE: Culture: >=100000 — AB

## 2018-10-04 LAB — HEMOGLOBIN A1C
Hgb A1c MFr Bld: 5.8 % — ABNORMAL HIGH (ref 4.8–5.6)
Mean Plasma Glucose: 120 mg/dL

## 2018-10-04 LAB — MAGNESIUM: Magnesium: 1.9 mg/dL (ref 1.7–2.4)

## 2018-10-04 MED ORDER — AMIODARONE HCL IN DEXTROSE 360-4.14 MG/200ML-% IV SOLN: 30.0000 mg/h | INTRAVENOUS | Status: DC

## 2018-10-04 MED ORDER — METOPROLOL TARTRATE 5 MG/5ML IV SOLN
5.0000 mg | INTRAVENOUS | Status: DC | PRN
  Administered 2018-10-04: 5 mg via INTRAVENOUS
  Filled 2018-10-04: qty 5

## 2018-10-04 MED ORDER — CARVEDILOL 6.25 MG PO TABS: 6.2500 mg | ORAL_TABLET | Freq: Two times a day (BID) | ORAL | Status: DC

## 2018-10-04 MED ORDER — AMIODARONE HCL IN DEXTROSE 360-4.14 MG/200ML-% IV SOLN: 60.0000 mg/h | INTRAVENOUS | Status: DC

## 2018-10-04 MED ORDER — AMIODARONE LOAD VIA INFUSION
150.0000 mg | Freq: Once | INTRAVENOUS | Status: DC
  Filled 2018-10-04: qty 83.34

## 2018-10-04 MED ORDER — METOPROLOL TARTRATE 25 MG PO TABS
25.0000 mg | ORAL_TABLET | Freq: Two times a day (BID) | ORAL | Status: DC
  Administered 2018-10-04 – 2018-10-05 (×3): 25 mg via ORAL
  Filled 2018-10-04 (×3): qty 1

## 2018-10-04 MED ORDER — CEPHALEXIN 250 MG PO CAPS
250.0000 mg | ORAL_CAPSULE | Freq: Four times a day (QID) | ORAL | Status: DC
  Administered 2018-10-04 – 2018-10-05 (×4): 250 mg via ORAL
  Filled 2018-10-04 (×5): qty 1

## 2018-10-04 MED ORDER — POTASSIUM CHLORIDE CRYS ER 20 MEQ PO TBCR
40.0000 meq | EXTENDED_RELEASE_TABLET | Freq: Once | ORAL | Status: AC
  Administered 2018-10-04: 40 meq via ORAL
  Filled 2018-10-04: qty 2

## 2018-10-04 NOTE — Plan of Care (Signed)
  Problem: Education: Goal: Knowledge of General Education information will improve Description: Including pain rating scale, medication(s)/side effects and non-pharmacologic comfort measures Outcome: Progressing   Problem: Health Behavior/Discharge Planning: Goal: Ability to manage health-related needs will improve Outcome: Not Progressing Note: Patient had a sustained episode of SVT earlier today with HR soaring into the upper 180's when attempting to use the B.S.C. IV PRN Lopressor given shortly thereafter, and shortly after that patient re-converted to NSR. Will continue to monitor patient's HR and rhythm for the remainder of the shift. Wenda Low [facility]

## 2018-10-04 NOTE — Progress Notes (Signed)
It appears patient has gone back into A-Fib w/ R.V.R. recently, rates = about 116. Took patient's B.P., only 96 systolic. Wanted to give IV PRN Lopressor, but will hold off for low BP. Informed cardiology in person regarding this, they state they will take a look and possibly drop afternoon Coreg dose. Will continue to monitor H.R. and rhythm for the remainder of the shift. Wenda Low [facility]

## 2018-10-04 NOTE — Progress Notes (Signed)
[employer] [facility] [place] [facility]   PATIENT NAME: Kevin Bradshaw    MR#:  [id number]  DATE OF BIRTH:  1935/05/28  SUBJECTIVE:  CHIEF COMPLAINT:   Chief Complaint  Patient presents with   Dysuria   Weakness   Altered Mental Status   -Patient had an episode of rapid ventricular response this morning, converted into normal sinus rhythm after IV metoprolol push. -Denies any complaints other than weakness.  PT recommended rehab  REVIEW OF SYSTEMS:  Review of Systems  Constitutional: Positive for malaise/fatigue. Negative for chills and fever.  HENT: Negative for hearing loss.   Eyes: Negative for blurred vision and double vision.  Respiratory: Negative for cough, shortness of breath and wheezing.   Cardiovascular: Negative for chest pain and palpitations.  Gastrointestinal: Negative for abdominal pain, constipation, diarrhea, nausea and vomiting.  Genitourinary: Negative for dysuria.  Musculoskeletal: Positive for falls.  Neurological: Positive for weakness. Negative for dizziness, seizures and headaches.  Psychiatric/Behavioral: Negative for depression.    DRUG ALLERGIES:  No Known Allergies  VITALS:  Blood pressure 127/88, pulse (!) 52, temperature 97.8 F (36.6 C), temperature source Oral, resp. rate 19, height 5\' 3"  (1.6 m), weight 64 kg, SpO2 94 %.  PHYSICAL EXAMINATION:  Physical Exam   GENERAL:  83 y.o.-year-old elderly patient lying in the bed with no acute distress.  EYES: Pupils equal, round, reactive to light and accommodation. No scleral icterus. Extraocular muscles intact.  HEENT: Head atraumatic, normocephalic. Oropharynx and nasopharynx clear.  NECK:  Supple, no jugular venous distention. No thyroid enlargement, no tenderness.  LUNGS: Normal breath sounds bilaterally, no wheezing, rales,rhonchi or crepitation. No use of accessory muscles of respiration.  Decreased bibasilar breath sounds CARDIOVASCULAR: S1, S2 normal. No murmurs, rubs, or  gallops.  ABDOMEN: Soft, nontender, nondistended. Bowel sounds present. No organomegaly or mass.  EXTREMITIES: No pedal edema, cyanosis, or clubbing.  NEUROLOGIC: Cranial nerves II through XII are intact. Muscle strength 5/5 in all extremities. Sensation intact. Gait not checked.  Global weakness noted PSYCHIATRIC: The patient is alert and oriented x 3.  Intermittent confusion noted. SKIN: No obvious rash, lesion, or ulcer.    LABORATORY PANEL:   CBC Recent Labs  Lab 10/03/18 0428  WBC 12.2*  HGB 10.6*  HCT 31.9*  PLT 161   ------------------------------------------------------------------------------------------------------------------  Chemistries  Recent Labs  Lab 10/02/18 1550  10/04/18 0606  NA 136   < > 140  K 3.0*   < > 3.4*  CL 98   < > 107  CO2 25   < > 25  GLUCOSE 112*   < > 95  BUN 23   < > 13  CREATININE 1.08   < > 0.79  CALCIUM 9.2   < > 8.7*  MG  --    < > 1.9  AST 28  --   --   ALT 20  --   --   ALKPHOS 62  --   --   BILITOT 1.6*  --   --    < > = values in this interval not displayed.   ------------------------------------------------------------------------------------------------------------------  Cardiac Enzymes No results for input(s): TROPONINI in the last 168 hours. ------------------------------------------------------------------------------------------------------------------  RADIOLOGY:  Ct Head Wo Contrast  Result Date: 10/02/2018 CLINICAL DATA:  Head trauma. Multiple unwitnessed falls over the last 2 days. EXAM: CT HEAD WITHOUT CONTRAST TECHNIQUE: Contiguous axial images were obtained from the base of the skull through the vertex without intravenous contrast. COMPARISON:  08/21/2017 FINDINGS: Brain:  No evidence of acute infarction, hemorrhage, hydrocephalus, extra-axial collection or mass lesion/mass effect. There is mild diffuse low-attenuation within the subcortical and periventricular white matter compatible with chronic microvascular  disease. Chronic left basal ganglia lacunar infarct. Prominence of the sulci and ventricles compatible with brain atrophy. Vascular: No hyperdense vessel or unexpected calcification. Skull: Normal. Negative for fracture or focal lesion. Sinuses/Orbits: No acute finding. Other: None IMPRESSION: 1. No acute intracranial abnormality. 2. Chronic small vessel ischemic disease with brain atrophy. 3. Old left basal ganglia lacunar infarct. Electronically Signed   By: Signa Kell M.D.   On: 10/02/2018 17:25   Ct Renal Stone Study  Result Date: 10/02/2018 CLINICAL DATA:  Left flank pain. Left lower back pain. Multiple falls in the last 2 days. EXAM: CT ABDOMEN AND PELVIS WITHOUT CONTRAST TECHNIQUE: Multidetector CT imaging of the abdomen and pelvis was performed following the standard protocol without IV contrast. COMPARISON:  None. FINDINGS: Lower chest: 5 mm pulmonary nodule in the anterior aspect of the right middle lobe. Small ill-defined area of density in the lingula and minimal atelectasis in the medial aspect of the right middle lobe. Aortic atherosclerosis. Coronary artery calcifications. Heart size is normal. No pericardial effusion. Hepatobiliary: The liver parenchyma appears normal. There is a single tiny stone in the otherwise normal appearing gallbladder. There is a 10 x 4 mm oblong stone in the region of the distal common bile duct. Common bile duct diameter is 8 mm. Is the patient's bilirubin elevated? Pancreas: Pancreas is normal. No pancreatic ductal dilatation. Spleen: Normal. Adrenals/Urinary Tract: Adrenal glands are unremarkable. Kidneys are normal, without renal calculi, focal lesion, or hydronephrosis. Bladder is unremarkable. Stomach/Bowel: Stomach is within normal limits. Appendix appears normal. No evidence of bowel wall thickening, distention, or inflammatory changes. Vascular/Lymphatic: Aortic atherosclerosis. No enlarged abdominal or pelvic lymph nodes. Reproductive: Prostate is  unremarkable. Other: No abdominal wall hernia or abnormality. No abdominopelvic ascites. Musculoskeletal: No acute abnormality. Multilevel degenerative disc and joint disease. Posterior decompression at L3-4 through L5-S1. IMPRESSION: 1. 10 x 4 mm stone in the region of the distal common bile duct with slight dilatation of the common bile duct. 2. Tiny stone in the otherwise normal appearing gallbladder. 3. Aortic atherosclerosis. 4. 5 mm pulmonary nodule in the anterior aspect of the right middle lobe. If the patient is at low risk for lung cancer, no further follow-up is recommended. If the patient is at high risk for lung cancer, follow-up CT scan of the chest without contrast in 12 months is recommended. Aortic Atherosclerosis (ICD10-I70.0). Electronically Signed   By: Francene Boyers M.D.   On: 10/02/2018 17:30    EKG:   Orders placed or performed during the hospital encounter of 10/02/18   EKG 12-Lead   EKG 12-Lead   EKG 12-Lead   EKG 12-Lead   EKG 12-Lead   EKG 12-Lead   EKG 12-Lead   EKG 12-Lead    ASSESSMENT AND PLAN:   83 year old male with past medical history significant for stroke, Parkinson's disease, hypertension presents to hospital secondary to falls and weakness.  1.  Generalized weakness and mild acute encephalopathy-secondary to acute cystitis on admission -Blood cultures are negative.  Urine cultures are growing pansensitive E. coli. -Received Rocephin in the hospital.  Changed to Keflex for total of 5 days. -Physical therapy consulted.  Will need rehab at discharge  2.  New onset A. fib with RVR-rate controlled at this time.  Converted to sinus rhythm today. -Started on low-dose carvedilol. -Cardiology consulted.  Echocardiogram shows normal EF of 55 to 60%, mild LVH with elevated left atrial pressures noted.  Diastolic dysfunction is noted. -TSH within normal limits.  Replace electrolytes. -Due to recurrent falls, not a good candidate for  anticoagulation  3.  Hypokalemia and hypomagnesemia- replaced  4.  History of stroke-continue aspirin and Plavix.  5.  DVT prophylaxis-Lovenox  Discussed with daughter over the phone.  Appreciate physical therapy input -Discharge to rehab tomorrow    All the records are reviewed and case discussed with Care Management/Social Workerr. Management plans discussed with the patient, family and they are in agreement.  CODE STATUS: Full code  TOTAL TIME TAKING CARE OF THIS PATIENT: 38 minutes.   POSSIBLE D/C IN 2 DAYS, DEPENDING ON CLINICAL CONDITION.   Gladstone Lighter M.D on 10/04/2018 at 1:00 PM  Between 7am to 6pm - Pager - [phone]  After 6pm go to [email or web address] - password [email or web address] [facility]  Office  [phone]  CC: Primary care physician; [name], MD

## 2018-10-04 NOTE — Progress Notes (Signed)
Progress Note  Patient Name: Kevin Bradshaw Date of Encounter: 10/04/2018  Primary Cardiologist: Previously seen by Dr. Gala RomneyBensimhon; Dr. Azucena CecilAgbor-Etang rounding  Subjective   Patient remains asx in Afib. No chest pain, palpitations, or racing HR. Unaware he is in atrial fibrillation. Per RN, rates into the high 180s-190s with patient movement and patient asymptomatic when checked on.   Inpatient Medications    Scheduled Meds:  aspirin EC  81 mg Oral Daily   atorvastatin  80 mg Oral q1800   [START ON 10/05/2018] carvedilol  6.25 mg Oral BID WC   cephALEXin  250 mg Oral Q6H   clopidogrel  75 mg Oral Daily   enoxaparin (LOVENOX) injection  40 mg Subcutaneous Q24H   tamsulosin  0.4 mg Oral Daily   Continuous Infusions:  PRN Meds: acetaminophen **OR** acetaminophen, metoprolol tartrate, ondansetron **OR** ondansetron (ZOFRAN) IV   Vital Signs    Vitals:   10/04/18 0323 10/04/18 0357 10/04/18 0758 10/04/18 1430  BP:  127/82 127/88 96/74  Pulse:  (!) 40 (!) 52   Resp:  20 19   Temp:  98.7 F (37.1 C) 97.8 F (36.6 C)   TempSrc:  Oral Oral   SpO2:  93% 94%   Weight: 64 kg     Height:        Intake/Output Summary (Last 24 hours) at 10/04/2018 1502 Last data filed at 10/04/2018 1409 Gross per 24 hour  Intake 240 ml  Output 1850 ml  Net -1610 ml   Last 3 Weights 10/04/2018 10/03/2018 10/02/2018  Weight (lbs) 141 lb 1.5 oz 142 lb 10.2 oz 140 lb  Weight (kg) 64 kg 64.7 kg 63.504 kg      Telemetry    IRIR with rates into high 180s with movement - Personally Reviewed  ECG    No new tracings - Personally Reviewed  Physical Exam   GEN: No acute distress.  Frail and elderly male Neck: No JVD Cardiac: IRIR, no murmurs, rubs, or gallops.  Respiratory: Clear to auscultation bilaterally. GI: Soft, nontender, non-distended  MS: No edema; No deformity. Neuro:  Nonfocal  Psych: Normal affect   Labs    High Sensitivity Troponin:  No results for input(s): TROPONINIHS  in the last 720 hours.    Cardiac EnzymesNo results for input(s): TROPONINI in the last 168 hours. No results for input(s): TROPIPOC in the last 168 hours.   Chemistry Recent Labs  Lab 10/02/18 1550 10/03/18 0428 10/03/18 1338 10/04/18 0606  NA 136 138  --  140  K 3.0* 2.6* 3.7 3.4*  CL 98 103  --  107  CO2 25 24  --  25  GLUCOSE 112* 129*  --  95  BUN 23 18  --  13  CREATININE 1.08 0.94  --  0.79  CALCIUM 9.2 8.0*  --  8.7*  PROT 7.2  --   --   --   ALBUMIN 3.8  --   --   --   AST 28  --   --   --   ALT 20  --   --   --   ALKPHOS 62  --   --   --   BILITOT 1.6*  --   --   --   GFRNONAA >60 >60  --  >60  GFRAA >60 >60  --  >60  ANIONGAP 13 11  --  8     Hematology Recent Labs  Lab 10/02/18 1550 10/03/18 0428  WBC 16.8*  12.2*  RBC 4.35 3.63*  HGB 12.7* 10.6*  HCT 38.2* 31.9*  MCV 87.8 87.9  MCH 29.2 29.2  MCHC 33.2 33.2  RDW 14.0 13.9  PLT 195 161    BNPNo results for input(s): BNP, PROBNP in the last 168 hours.   DDimer No results for input(s): DDIMER in the last 168 hours.   Radiology    Ct Head Wo Contrast  Result Date: 10/02/2018 CLINICAL DATA:  Head trauma. Multiple unwitnessed falls over the last 2 days. EXAM: CT HEAD WITHOUT CONTRAST TECHNIQUE: Contiguous axial images were obtained from the base of the skull through the vertex without intravenous contrast. COMPARISON:  08/21/2017 FINDINGS: Brain: No evidence of acute infarction, hemorrhage, hydrocephalus, extra-axial collection or mass lesion/mass effect. There is mild diffuse low-attenuation within the subcortical and periventricular white matter compatible with chronic microvascular disease. Chronic left basal ganglia lacunar infarct. Prominence of the sulci and ventricles compatible with brain atrophy. Vascular: No hyperdense vessel or unexpected calcification. Skull: Normal. Negative for fracture or focal lesion. Sinuses/Orbits: No acute finding. Other: None IMPRESSION: 1. No acute intracranial  abnormality. 2. Chronic small vessel ischemic disease with brain atrophy. 3. Old left basal ganglia lacunar infarct. Electronically Signed   By: Signa Kell M.D.   On: 10/02/2018 17:25   Ct Renal Stone Study  Result Date: 10/02/2018 CLINICAL DATA:  Left flank pain. Left lower back pain. Multiple falls in the last 2 days. EXAM: CT ABDOMEN AND PELVIS WITHOUT CONTRAST TECHNIQUE: Multidetector CT imaging of the abdomen and pelvis was performed following the standard protocol without IV contrast. COMPARISON:  None. FINDINGS: Lower chest: 5 mm pulmonary nodule in the anterior aspect of the right middle lobe. Small ill-defined area of density in the lingula and minimal atelectasis in the medial aspect of the right middle lobe. Aortic atherosclerosis. Coronary artery calcifications. Heart size is normal. No pericardial effusion. Hepatobiliary: The liver parenchyma appears normal. There is a single tiny stone in the otherwise normal appearing gallbladder. There is a 10 x 4 mm oblong stone in the region of the distal common bile duct. Common bile duct diameter is 8 mm. Is the patient's bilirubin elevated? Pancreas: Pancreas is normal. No pancreatic ductal dilatation. Spleen: Normal. Adrenals/Urinary Tract: Adrenal glands are unremarkable. Kidneys are normal, without renal calculi, focal lesion, or hydronephrosis. Bladder is unremarkable. Stomach/Bowel: Stomach is within normal limits. Appendix appears normal. No evidence of bowel wall thickening, distention, or inflammatory changes. Vascular/Lymphatic: Aortic atherosclerosis. No enlarged abdominal or pelvic lymph nodes. Reproductive: Prostate is unremarkable. Other: No abdominal wall hernia or abnormality. No abdominopelvic ascites. Musculoskeletal: No acute abnormality. Multilevel degenerative disc and joint disease. Posterior decompression at L3-4 through L5-S1. IMPRESSION: 1. 10 x 4 mm stone in the region of the distal common bile duct with slight dilatation of the  common bile duct. 2. Tiny stone in the otherwise normal appearing gallbladder. 3. Aortic atherosclerosis. 4. 5 mm pulmonary nodule in the anterior aspect of the right middle lobe. If the patient is at low risk for lung cancer, no further follow-up is recommended. If the patient is at high risk for lung cancer, follow-up CT scan of the chest without contrast in 12 months is recommended. Aortic Atherosclerosis (ICD10-I70.0). Electronically Signed   By: Francene Boyers M.D.   On: 10/02/2018 17:30    Cardiac Studies   10/03/2018 Echo  1. Left ventricular ejection fraction, by visual estimation, is 55 to 60%. The left ventricle has normal function. Normal left ventricular size. There is mildly increased  left ventricular hypertrophy.  2. Elevated mean left atrial pressure.  3. Left ventricular diastolic Doppler parameters are consistent with pseudonormalization pattern of LV diastolic filling.  4. Global right ventricle has normal systolic function.The right ventricular size is normal. No increase in right ventricular wall thickness.  5. Left atrial size was normal.  6. Right atrial size was normal.  7. Moderate calcification of the mitral valve leaflet(s).  8. Moderate thickening of the mitral valve leaflet(s).  9. The mitral valve is degenerative. No evidence of mitral valve regurgitation. 10. The tricuspid valve is not well visualized. Tricuspid valve regurgitation is trivial. 11. The aortic valve is tricuspid Aortic valve regurgitation was not visualized by color flow Doppler. Mild aortic valve sclerosis without stenosis. 12. The pulmonic valve was normal in structure. Pulmonic valve regurgitation is trivial by color flow Doppler. 13. TR signal is inadequate for assessing pulmonary artery systolic pressure. 14. The inferior vena cava is normal in size with greater than 50% respiratory variability, suggesting right atrial pressure of 3 mmHg.   Patient Profile     83 y.o. male with a hx of CVA  08/21/2017 with PTA Plavix, hypertension, hyperlipidemia, Parkinson's disease, past history of tobacco use (1.5 pk daily, quit 2004), self reported history of falls q2 weeks, and who is being seen today for the evaluation of atrial fibrillation  Assessment & Plan    PAF --Asymptomatic in atrial fibrillation. Unclear if Afib is new in onset or has been ongoing and possibly the etiology of his 2019 CVA. Patient reported no known history of cardiac disease or arrhythmia. Previous EF normal with updated echo EF also normal.  --Changed low dose Coreg to metoprolol 25mg  BID for now and given hypotension with SBP in the 90s and rates into the 180s with movement and 100-130s at rest. Unfortunately, we cannot start on amiodarone at this time given patient not therapeutically anticoagulated so still runs the risk of thrombotic event with pharmacologic conversion. Continue with PRN IV lopressor with hold parameters. --In and out of Afib on telemetry. --PTA aspirin 81 mg daily and Plavix for history of CVA.  Frequent falls. Patient does not report current signs or symptoms consistent with acute head bleed and with head CT at admission negative for acute intracranial abnormality. --CHA2DS2VASc score of at least 6 (HTN, DM2, agex2, strokex2) which would normally indicate need for long-term oral anticoagulation; however, given the patient's history of frequent falls (during which time he may be hitting his head), anticoagulation has been deferred for now.  --TSH wnl.  --Daughter contacted yesterday for additional information re: patient's falls and frequency of falls. Will plan for rate control only at this time given risks of bleeding with anticoagulation. Patient evaluated by PT with recommendation for physical therapy / strengthening. Daily CBC recommended.   Hypokalemia --K 3.4. Replete with goal 4.0.. Check Mg with goal 2.0. --Daily BMET.  HTN --BP soft to hypotensive with recommendations as  above.  HLD --Continue statin therapy. LDL improved from 100 on 08/22/2017 to 68. LFTs normal on most recent labs.     UTI --Abx. Per IM  Parkinson's Disease --Patient has daughter - contact for further information regarding his health history if needed. Per IM.  Lung nodule per CT --As in HPI and scans listed above, recommendation per radiologist is for further follow-up given history of previous smoking and with CT scan within 12 mo. Per IM, PCP.  For questions or updates, please contact [place] Please consult [email or web address] for contact info under  Signed, Lennon Alstrom, PA-C  10/04/2018, 3:02 PM

## 2018-10-04 NOTE — TOC Initial Note (Signed)
Transition of Care [facility] - Initial/Assessment Note    Patient Details  Name: Kevin Bradshaw MRN: [id number] Date of Birth: May 02, 1935  Transition of [facility] CM/SW Contact:    Sherren Kerns, RN Phone Number: 10/04/2018, 9:42 AM  Clinical Narrative:        Patient admitted with UTI, weakness, frequent recent falls.  Lives with daughter Rinaldo Cloud.  Spoke with [phone].  She states he lives with her; current with Dr. Letitia Libra; obtains [facility] [place].  Has a walke rand a wheelchair at home.  He has had frequent falls recently and increased weakness.  She states his cognitive baseline is forgetfulness.  "He will sometimes forget what he is going to say".  Rinaldo Cloud and patient are agreeable to SNF.  Work up complete and awaiting bed offers.  Rinaldo Cloud does not want [facility].  He has been to [facility] and [name] in the past.  Will  Continue to assist with DC planning.  Zerita Boers, BSN, RN Care Manager [phone]                             Patient Goals and CMS Choice        Expected Discharge Plan and Services                                                Prior Living Arrangements/Services                       Activities of Daily Living Home Assistive Devices/Equipment: Dan Humphreys (specify type) ADL Screening (condition at time of admission) Patient's cognitive ability adequate to safely complete daily activities?: Yes Is the patient deaf or have difficulty hearing?: No Does the patient have difficulty seeing, even when wearing glasses/contacts?: No Does the patient have difficulty concentrating, remembering, or making decisions?: No Patient able to express need for assistance with ADLs?: Yes Does the patient have difficulty dressing or bathing?: Yes Independently performs ADLs?: No Communication: Independent Dressing (OT): Needs assistance Is this a change from baseline?: Pre-admission  baseline Grooming: Independent Feeding: Independent Bathing: Needs assistance Is this a change from baseline?: Pre-admission baseline Toileting: Needs assistance Is this a change from baseline?: Pre-admission baseline In/Out Bed: Needs assistance Is this a change from baseline?: Pre-admission baseline Does the patient have difficulty walking or climbing stairs?: Yes Weakness of Legs: Both Weakness of Arms/Hands: Both  Permission Sought/Granted                  Emotional Assessment              Admission diagnosis:  SIRS (systemic inflammatory response syndrome) (HCC) [R65.10] Acute cystitis with hematuria [N30.01] Patient Active Problem List   Diagnosis Date Noted  . UTI (urinary tract infection) 10/02/2018  . Atrial fibrillation with RVR (HCC) 10/02/2018  . Sepsis (HCC) 02/18/2018  . Acute CVA (cerebrovascular accident) (HCC) 08/22/2017  . Right leg weakness 08/21/2017  . 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 CAP (community acquired pneumonia) 01/06/2016  . Productive cough 11/09/2010  . Hypertension 11/04/2010  . Hyperlipidemia 11/04/2010   PCP:  Gracelyn Nurse, MD Pharmacy:   [facility] [address]), [place] - [address] [place]) [place] [address] Phone: [phone] Fax:  [place], [place] [address], [place] [address] Phone: [phone] Fax: [phone]  [id number] [place], [place] [address] Phone: [phone] Fax: [phone]     Social Determinants of Health (SDOH) Interventions    Readmission Risk Interventions No flowsheet data found.

## 2018-10-04 NOTE — Progress Notes (Signed)
Patient had a large B.M. in the bed before I could return with the bedpan. Cleaned up with the help of [place], [place]. Replaced condom catheter as well. At that time H.R. went into the 150's, but after cleaning him up returned to about 100. MEWS = 2 at this time d/t "high pulse". Pulse currently isn't high when patient not moving / ambulating. No acute change(s). Will continue to monitor for remainder of the shift. Wenda Low [facility]

## 2018-10-04 NOTE — Progress Notes (Signed)
PT Cancellation Note  Patient Details Name: Kevin Bradshaw MRN: [id number] DOB: 10-30-1935   Cancelled Treatment:    Reason Eval/Treat Not Completed: Other (comment).  Pt noted to be in a-fib rhythm and per chart pt's HR increased into upper 180's bpm earlier today when attempting to use the [facility].  Discussed with pt's nurse.  Will hold PT at this time d/t HR concerns with exertional activity and will re-attempt PT treatment at a later date/time as medically appropriate.  Leitha Bleak, PT 10/04/18, 4:07 PM [phone]

## 2018-10-04 NOTE — NC FL2 (Signed)
[place] LEVEL OF CARE SCREENING TOOL     IDENTIFICATION  Patient Name: Kevin Bradshaw Birthdate: 1935-07-27 Sex: male Admission Date (Current Location): 10/02/2018  [place] and [place] Number:  Engineering geologist and Address:  [facility], [address] [place], [place] [address]      Provider Number: [id number]  Attending Physician Name and Address:  Gladstone Lighter, MD  Relative Name and Phone Number:  Tonette Bihari [phone]    Current Level of Care: Hospital Recommended Level of Care: [place] Prior Approval Number:    Date Approved/Denied:   PASRR Number: [id number]  Discharge Plan: SNF    Current Diagnoses: Patient Active Problem List   Diagnosis Date Noted  . UTI (urinary tract infection) 10/02/2018  . Atrial fibrillation with RVR ([place]) 10/02/2018  . Sepsis ([place]) 02/18/2018  . Acute CVA (cerebrovascular accident) ([place]) 08/22/2017  . Right leg weakness 08/21/2017  . 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 CAP (community acquired pneumonia) 01/06/2016  . Productive cough 11/09/2010  . Hypertension 11/04/2010  . Hyperlipidemia 11/04/2010    Orientation RESPIRATION BLADDER Height & Weight     Self, Time, Situation, Place  Normal Continent Weight: 64 kg Height:  5\' 3"  (160 cm)  BEHAVIORAL SYMPTOMS/MOOD NEUROLOGICAL BOWEL NUTRITION STATUS      Continent Diet(Heart Healthy)  AMBULATORY STATUS COMMUNICATION OF NEEDS Skin   Limited Assist Verbally Normal                       Personal Care Assistance Level of Assistance  Bathing, Feeding, Dressing Bathing Assistance: Limited assistance Feeding assistance: Independent Dressing Assistance: Limited assistance     Functional Limitations Info  Sight, Hearing, Speech Sight Info: Adequate Hearing Info: Adequate Speech Info: Adequate    SPECIAL CARE FACTORS FREQUENCY  PT (By licensed PT), OT (By licensed OT)     PT Frequency: 5 x a  week OT Frequency: 5 x a week            Contractures Contractures Info: Not present    Additional Factors Info  Code Status, Allergies Code Status Info: FULL Allergies Info: NKA           Current Medications (10/04/2018):  This is the current hospital active medication list Current Facility-Administered Medications  Medication Dose Route Frequency Provider Last Rate Last Dose  . acetaminophen (TYLENOL) tablet 650 mg  650 mg Oral Q6H PRN Lance Coon, MD   650 mg at 10/03/18 0440   Or  . acetaminophen (TYLENOL) suppository 650 mg  650 mg Rectal Q6H PRN Lance Coon, MD      . amLODipine (NORVASC) tablet 5 mg  5 mg Oral Daily Lance Coon, MD      . aspirin EC tablet 81 mg  81 mg Oral Daily Lance Coon, MD   81 mg at 10/03/18 0908  . atorvastatin (LIPITOR) tablet 80 mg  80 mg Oral q1800 Lance Coon, MD   80 mg at 10/03/18 1638  . carvedilol (COREG) tablet 6.25 mg  6.25 mg Oral BID WC Gladstone Lighter, MD      . cefTRIAXone (ROCEPHIN) 1 g in sodium chloride 0.9 % 100 mL IVPB  1 g Intravenous Q24H Lance Coon, MD 200 mL/hr at 10/03/18 2137 1 g at 10/03/18 2137  . clopidogrel (PLAVIX) tablet 75 mg  75 mg Oral Daily Lance Coon, MD   75 mg at 10/03/18 0908  . enoxaparin (LOVENOX) injection 40 mg  40  mg Subcutaneous Q24H Oralia Manis, MD   40 mg at 10/04/18 0552  . metoprolol tartrate (LOPRESSOR) injection 5 mg  5 mg Intravenous Q4H PRN Enid Baas, MD      . ondansetron (ZOFRAN) tablet 4 mg  4 mg Oral Q6H PRN Oralia Manis, MD       Or  . ondansetron [facility]) injection 4 mg  4 mg Intravenous Q6H PRN Oralia Manis, MD      . potassium chloride SA (K-DUR) CR tablet 40 mEq  40 mEq Oral Once Enid Baas, MD      . tamsulosin [facility]) capsule 0.4 mg  0.4 mg Oral Daily Oralia Manis, MD   0.4 mg at 10/03/18 0908     Discharge Medications: Please see discharge summary for a list of discharge medications.  Relevant Imaging Results:  Relevant Lab  Results:   Additional Information SSN: [id number]  Sherren Kerns, RN

## 2018-10-04 NOTE — TOC Progression Note (Signed)
Transition of Care [facility]) - Progression Note    Patient Details  Name: Kevin Bradshaw MRN: [id number] Date of Birth: 10-20-1935  Transition of [facility]) CM/SW Contact  Elza Rafter, RN Phone Number: 10/04/2018, 11:49 AM  Clinical Narrative:   Magda Paganini with [facility] has accepted patient and patient can DC there when medically stable.  Covid negative on 9-22.    Expected Discharge Plan: [place] Barriers to Discharge: Continued Medical Work up  Expected Discharge Plan and Services Expected Discharge Plan: [place]   Discharge Planning Services: CM Consult   Living arrangements for the past 2 months: Single Family Home                                       Social Determinants of Health (SDOH) Interventions    Readmission Risk Interventions No flowsheet data found.

## 2018-10-05 ENCOUNTER — Telehealth: Payer: Self-pay | Admitting: Internal Medicine

## 2018-10-05 DIAGNOSIS — I10 Essential (primary) hypertension: Secondary | ICD-10-CM

## 2018-10-05 DIAGNOSIS — G214 Vascular parkinsonism: Secondary | ICD-10-CM

## 2018-10-05 LAB — BASIC METABOLIC PANEL
Anion gap: 10 (ref 5–15)
BUN: 18 mg/dL (ref 8–23)
CO2: 24 mmol/L (ref 22–32)
Calcium: 8.8 mg/dL — ABNORMAL LOW (ref 8.9–10.3)
Chloride: 104 mmol/L (ref 98–111)
Creatinine, Ser: 0.79 mg/dL (ref 0.61–1.24)
GFR calc Af Amer: >60 mL/min (ref >60)
GFR calc non Af Amer: >60 mL/min (ref >60)
Glucose, Bld: 97 mg/dL (ref 70–99)
Potassium: 3.7 mmol/L (ref 3.5–5.1)
Sodium: 138 mmol/L (ref 135–145)

## 2018-10-05 LAB — MAGNESIUM: Magnesium: 1.9 mg/dL (ref 1.7–2.4)

## 2018-10-05 MED ORDER — CEPHALEXIN 250 MG PO CAPS: 250.0000 mg | ORAL_CAPSULE | Freq: Four times a day (QID) | ORAL | 0 refills | Status: AC

## 2018-10-05 MED ORDER — METOPROLOL SUCCINATE ER 50 MG PO TB24: 50.0000 mg | ORAL_TABLET | Freq: Every day | ORAL | 11 refills | Status: DC

## 2018-10-05 NOTE — Progress Notes (Signed)
Progress Note  Patient Name: Kevin Bradshaw Date of Encounter: 10/05/2018  Primary Cardiologist: [facility]-Dr. End  Subjective   Reports that he feels well, no complaints He reports that he has not ambulated out of bed yet There is a walker in his room On further discussion he reports having frequent falls at home, last fall was 1 week ago, mechanical, legs gave out from under him on the stairs  Telemetry reviewed showing normal sinus rhythm  Inpatient Medications    Scheduled Meds: . aspirin EC  81 mg Oral Daily  . atorvastatin  80 mg Oral q1800  . cephALEXin  250 mg Oral Q6H  . clopidogrel  75 mg Oral Daily  . enoxaparin (LOVENOX) injection  40 mg Subcutaneous Q24H  . metoprolol tartrate  25 mg Oral BID  . tamsulosin  0.4 mg Oral Daily   Continuous Infusions:  PRN Meds: acetaminophen **OR** acetaminophen, metoprolol tartrate, ondansetron **OR** ondansetron (ZOFRAN) IV   Vital Signs    Vitals:   10/04/18 1539 10/04/18 2001 10/05/18 0326 10/05/18 0756  BP: 118/84 117/80 108/72 (!) 162/67  Pulse: (!) 111 87 79 80  Resp: 14 20 20 20   Temp: 97.6 F (36.4 C) 98.5 F (36.9 C) 98.6 F (37 C) 98 F (36.7 C)  TempSrc: Oral Oral Oral Oral  SpO2: 97% 97% 94% 96%  Weight:   63.6 kg   Height:        Intake/Output Summary (Last 24 hours) at 10/05/2018 1247 Last data filed at 10/05/2018 0947 Gross per 24 hour  Intake 360 ml  Output 1300 ml  Net -940 ml   Last 3 Weights 10/05/2018 10/04/2018 10/03/2018  Weight (lbs) 140 lb 3.4 oz 141 lb 1.5 oz 142 lb 10.2 oz  Weight (kg) 63.6 kg 64 kg 64.7 kg      Telemetry    Normal sinus rhythm- Personally Reviewed  ECG     - Personally Reviewed  Physical Exam   GEN: No acute distress.   Neck: No JVD Cardiac: RRR, no murmurs, rubs, or gallops.  Respiratory: Clear to auscultation bilaterally. GI: Soft, nontender, non-distended  MS: No edema; No deformity. Neuro:  Nonfocal  Psych: Normal affect   Labs    High Sensitivity  Troponin:  No results for input(s): TROPONINIHS in the last 720 hours.    Chemistry Recent Labs  Lab 10/02/18 1550 10/03/18 0428 10/03/18 1338 10/04/18 0606 10/05/18 0351  NA 136 138  --  140 138  K 3.0* 2.6* 3.7 3.4* 3.7  CL 98 103  --  107 104  CO2 25 24  --  25 24  GLUCOSE 112* 129*  --  95 97  BUN 23 18  --  13 18  CREATININE 1.08 0.94  --  0.79 0.79  CALCIUM 9.2 8.0*  --  8.7* 8.8*  PROT 7.2  --   --   --   --   ALBUMIN 3.8  --   --   --   --   AST 28  --   --   --   --   ALT 20  --   --   --   --   ALKPHOS 62  --   --   --   --   BILITOT 1.6*  --   --   --   --   GFRNONAA >60 >60  --  >60 >60  GFRAA >60 >60  --  >60 >60  ANIONGAP 13 11  --  8 10     Hematology Recent Labs  Lab 10/02/18 1550 10/03/18 0428  WBC 16.8* 12.2*  RBC 4.35 3.63*  HGB 12.7* 10.6*  HCT 38.2* 31.9*  MCV 87.8 87.9  MCH 29.2 29.2  MCHC 33.2 33.2  RDW 14.0 13.9  PLT 195 161    BNPNo results for input(s): BNP, PROBNP in the last 168 hours.   DDimer No results for input(s): DDIMER in the last 168 hours.   Radiology    No results found.  Cardiac Studies   Echocardiogram  1. Left ventricular ejection fraction, by visual estimation, is 55 to 60%. The left ventricle has normal function. Normal left ventricular size. There is mildly increased left ventricular hypertrophy.  2. Elevated mean left atrial pressure.  3. Left ventricular diastolic Doppler parameters are consistent with pseudonormalization pattern of LV diastolic filling.  4. Global right ventricle has normal systolic function.The right ventricular size is normal. No increase in right ventricular wall thickness.  5. Left atrial size was normal.  Patient Profile     83 y.o. male with a hx of CVA 08/21/2017 with PTA Plavix,hypertension, hyperlipidemia, Parkinson's disease,past history of tobacco use (1.5 pk daily, quit 2004), self reported history of falls q2 weeks,  evaluation ofatrial fibrillation   Assessment & Plan     PAF Maintaining normal sinus rhythm CHA2DS2VASc score of at least6 (HTN, DM2, agex2, strokex2) Currently not on anticoagulation given risk of falls -Needs close outpatient follow-up, Continue metoprolol 25 bid Follow-up with outpatient cardiology, Dr. end  Hx of cva  aspirin 81 mg daily and Plavix head CT at admission negative for acuteintracranial abnormality.   HTN On metoprolol alone as above   UTI Reason for presentation, on antibiotics  Parkinson's Disease Likely contributing to falls   Total encounter time more than 25 minutes  Greater than 50% was spent in counseling and coordination of care with the patient    For questions or updates, please contact [place] Please consult [email or web address] for contact info under        Signed, Ida Rogue, MD  10/05/2018, 12:47 PM

## 2018-10-05 NOTE — TOC Transition Note (Signed)
Transition of Care [facility]) - CM/SW Discharge Note   Patient Details  Name: Kevin Bradshaw MRN: [id number] Date of Birth: 1935-06-04  [facility] of [facility]) CM/SW Contact:  Elza Rafter, RN Phone Number: 10/05/2018, 11:34 AM   Clinical Narrative:   Patient discharging to [facility] via EMS.  Will go to room 503-report to [phone].  Magda Paganini with [facility] is aware.  [facility] once MD completes.     Final next level of care: Skilled Nursing Facility Barriers to Discharge: No Barriers Identified   Patient Goals and CMS Choice   CMS Medicare.gov Compare Post Acute Care list provided to:: Patient Represenative (must comment)(daughter Olin Hauser) Choice offered to / list presented to : Adult Children  Discharge Placement              Patient chooses bed at: [facility] Patient to be transferred to facility by: EMS      Discharge Plan and Services   Discharge Planning Services: CM Consult                                 Social Determinants of Health (SDOH) Interventions     Readmission Risk Interventions No flowsheet data found.

## 2018-10-05 NOTE — Telephone Encounter (Signed)
lmov to schedule tcm per Dr. Rockey Situ .

## 2018-10-05 NOTE — Plan of Care (Signed)
  Problem: Health Behavior/Discharge Planning: Goal: Ability to manage health-related needs will improve Outcome: Progressing   Problem: Clinical Measurements: Goal: Cardiovascular complication will be avoided Outcome: Progressing   Problem: Safety: Goal: Ability to remain free from injury will improve Outcome: Progressing   

## 2018-10-05 NOTE — Progress Notes (Signed)
IVs and tele removed from patient. Discharge instructions placed in packet for receiving facility. No acute distress at this time. Daughter is aware of plan of care. Patient prepared for transport, awaiting EMS.

## 2018-10-05 NOTE — Care Management Important Message (Signed)
Important Message  Patient Details  Name: Kevin Bradshaw MRN: [id number] Date of Birth: [birth year]   Medicare Important Message Given:  Yes     [name] 10/05/2018, 12:25 PM

## 2018-10-05 NOTE — Plan of Care (Signed)
  Problem: Health Behavior/Discharge Planning: Goal: Ability to manage health-related needs will improve Outcome: Progressing   Problem: Clinical Measurements: Goal: Will remain free from infection Outcome: Progressing Goal: Cardiovascular complication will be avoided Outcome: Progressing   Problem: Activity: Goal: Risk for activity intolerance will decrease Outcome: Progressing   Problem: Safety: Goal: Ability to remain free from injury will improve 10/05/2018 0336 by Abbe Amsterdam, RN Outcome: Progressing 10/05/2018 0016 by Abbe Amsterdam, RN Outcome: Progressing

## 2018-10-05 NOTE — Discharge Summary (Signed)
[facility] [place] [facility]   PATIENT NAME: Kevin Bradshaw    MR#:  [id number]  DATE OF BIRTH:  08-18-1935  DATE OF ADMISSION:  10/02/2018   ADMITTING PHYSICIAN: Oralia Manis, MD  DATE OF DISCHARGE: 10/05/2018  PRIMARY CARE PHYSICIAN: Gracelyn Nurse, MD   ADMISSION DIAGNOSIS:   SIRS (systemic inflammatory response syndrome) (HCC) [R65.10] Acute cystitis with hematuria [N30.01]  DISCHARGE DIAGNOSIS:   Principal Problem:   Sepsis (HCC) Active Problems:   Hypertension   Hyperlipidemia   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HCC)   UTI (urinary tract infection)   Atrial fibrillation with RVR (HCC)   SECONDARY DIAGNOSIS:   Past Medical History:  Diagnosis Date  . Hyperlipidemia   . Hypertension   . Parkinson disease [facility])     HOSPITAL COURSE:   83 year old male with past medical history significant for stroke, Parkinson's disease, hypertension presents to hospital secondary to falls and weakness.  1.  Generalized weakness and mild acute encephalopathy-secondary to acute cystitis on admission -Blood cultures are negative.  Urine cultures are growing pansensitive E. coli. -Received Rocephin in the hospital.  Changed to Keflex for total of 5 days. -Physical therapy consulted.  Will need rehab at discharge  2.  New onset A. fib with RVR-rate controlled at this time.  Converted to sinus rhythm. -Started on metoprolol.  Will be discharged on Toprol daily -Cardiology consulted.  Echocardiogram shows normal EF of 55 to 60%, mild LVH with elevated left atrial pressures noted.  Diastolic dysfunction is noted. -TSH within normal limits.  Replace electrolytes. -Due to recurrent falls, not a good candidate for anticoagulation at this time.  Discontinue aspirin  3.  Hypokalemia and hypomagnesemia- replaced Continue to monitor electrolytes given history of A. fib.  Recheck BMP in 1 week ordered  4.  History of stroke-continue aspirin and Plavix.   -Will be  discharged to [facility] rehab today   DISCHARGE CONDITIONS:   Guarded  CONSULTS OBTAINED:   Treatment Team:  Yvonne Kendall, MD  DRUG ALLERGIES:   No Known Allergies DISCHARGE MEDICATIONS:   Allergies as of 10/05/2018   No Known Allergies     Medication List    STOP taking these medications   amLODipine 5 MG tablet Commonly known as: NORVASC   hydrochlorothiazide 12.5 MG capsule Commonly known as: MICROZIDE   Klor-Con M20 20 MEQ tablet Generic drug: potassium chloride SA     TAKE these medications   acetaminophen 325 MG tablet Commonly known as: TYLENOL Take 2 tablets (650 mg total) by mouth every 6 (six) hours as needed for mild pain (or Fever >/= 101).   aspirin 81 MG tablet Take 81 mg by mouth daily.   atorvastatin 80 MG tablet Commonly known as: LIPITOR Take 1 tablet (80 mg total) by mouth daily at 6 PM.   cephALEXin 250 MG capsule Commonly known as: KEFLEX Take 1 capsule (250 mg total) by mouth every 6 (six) hours for 5 days.   clopidogrel 75 MG tablet Commonly known as: PLAVIX Take 1 tablet (75 mg total) by mouth daily.   metoprolol succinate 50 MG 24 hr tablet Commonly known as: Toprol XL Take 1 tablet (50 mg total) by mouth daily. Take with or immediately following a meal.   tamsulosin 0.4 MG Caps capsule Commonly known as: FLOMAX Take 0.4 mg by mouth daily.        DISCHARGE INSTRUCTIONS:   1.  PCP follow-up in 1 to 2 weeks 2.  Cardiology follow-up in  2 weeks  DIET:   Cardiac diet  ACTIVITY:   Activity as tolerated  OXYGEN:   Home Oxygen: No.  Oxygen Delivery: room air  DISCHARGE LOCATION:   nursing home   If you experience worsening of your admission symptoms, develop shortness of breath, life threatening emergency, suicidal or homicidal thoughts you must seek medical attention immediately by calling 911 or calling your MD immediately  if symptoms less severe.  You Must read complete instructions/literature  along with all the possible adverse reactions/side effects for all the Medicines you take and that have been prescribed to you. Take any new Medicines after you have completely understood and accpet all the possible adverse reactions/side effects.   Please note  You were cared for by a hospitalist during your hospital stay. If you have any questions about your discharge medications or the care you received while you were in the hospital after you are discharged, you can call the unit and asked to speak with the hospitalist on call if the hospitalist that took care of you is not available. Once you are discharged, your primary care physician will handle any further medical issues. Please note that NO REFILLS for any discharge medications will be authorized once you are discharged, as it is imperative that you return to your primary care physician (or establish a relationship with a primary care physician if you do not have one) for your aftercare needs so that they can reassess your need for medications and monitor your lab values.    On the day of Discharge:  VITAL SIGNS:   Blood pressure (!) 162/67, pulse 80, temperature 98 F (36.7 C), temperature source Oral, resp. rate 20, height 5\' 3"  (1.6 m), weight 63.6 kg, SpO2 96 %.  PHYSICAL EXAMINATION:     GENERAL:  83 y.o.-year-old elderly patient lying in the bed with no acute distress.  EYES: Pupils equal, round, reactive to light and accommodation. No scleral icterus. Extraocular muscles intact.  HEENT: Head atraumatic, normocephalic. Oropharynx and nasopharynx clear.  NECK:  Supple, no jugular venous distention. No thyroid enlargement, no tenderness.  LUNGS: Normal breath sounds bilaterally, no wheezing, rales,rhonchi or crepitation. No use of accessory muscles of respiration.  Decreased bibasilar breath sounds CARDIOVASCULAR: S1, S2 normal. No murmurs, rubs, or gallops.  ABDOMEN: Soft, nontender, nondistended. Bowel sounds present. No  organomegaly or mass.  EXTREMITIES: No pedal edema, cyanosis, or clubbing.  NEUROLOGIC: Cranial nerves II through XII are intact. Muscle strength 5/5 in all extremities. Sensation intact. Gait not checked.  Global weakness noted PSYCHIATRIC: The patient is alert and oriented x 3.  Intermittent confusion noted. SKIN: No obvious rash, lesion, or ulcer.   DATA REVIEW:   CBC Recent Labs  Lab 10/03/18 0428  WBC 12.2*  HGB 10.6*  HCT 31.9*  PLT 161    Chemistries  Recent Labs  Lab 10/02/18 1550  10/05/18 0351  NA 136   < > 138  K 3.0*   < > 3.7  CL 98   < > 104  CO2 25   < > 24  GLUCOSE 112*   < > 97  BUN 23   < > 18  CREATININE 1.08   < > 0.79  CALCIUM 9.2   < > 8.8*  MG  --    < > 1.9  AST 28  --   --   ALT 20  --   --   ALKPHOS 62  --   --   BILITOT 1.6*  --   --    < > =  values in this interval not displayed.     Microbiology Results  Results for orders placed or performed during the hospital encounter of 10/02/18  Urine culture     Status: Abnormal   Collection Time: 10/02/18  5:56 PM   Specimen: Urine, Random  Result Value Ref Range Status   Specimen Description   Final    URINE, RANDOM Performed at [facility], [address] [place], [place] [address]    Special Requests   Final    NONE Performed at [facility], [address] [place], [place] [address]    Culture >=100,000 COLONIES/mL ESCHERICHIA COLI (A)  Final   Report Status 10/04/2018 FINAL  Final   Organism ID, Bacteria ESCHERICHIA COLI (A)  Final      Susceptibility   Escherichia coli - MIC*    AMPICILLIN 8 SENSITIVE Sensitive     CEFAZOLIN <=4 SENSITIVE Sensitive     CEFTRIAXONE <=1 SENSITIVE Sensitive     CIPROFLOXACIN <=0.25 SENSITIVE Sensitive     GENTAMICIN <=1 SENSITIVE Sensitive     IMIPENEM <=0.25 SENSITIVE Sensitive     NITROFURANTOIN <=16 SENSITIVE Sensitive     TRIMETH/SULFA <=20 SENSITIVE Sensitive     AMPICILLIN/SULBACTAM <=2 SENSITIVE Sensitive      PIP/TAZO <=4 SENSITIVE Sensitive     Extended ESBL NEGATIVE Sensitive     * >=100,000 COLONIES/mL ESCHERICHIA COLI  Blood culture (routine x 2)     Status: None (Preliminary result)   Collection Time: 10/02/18  7:27 PM   Specimen: BLOOD  Result Value Ref Range Status   Specimen Description BLOOD RIGHT ANTECUBITAL  Final   Special Requests   Final    BOTTLES DRAWN AEROBIC AND ANAEROBIC Blood Culture adequate volume   Culture   Final    NO GROWTH 3 DAYS Performed at [facility], [address] [place], [place] [address]    Report Status PENDING  Incomplete  Blood culture (routine x 2)     Status: None (Preliminary result)   Collection Time: 10/02/18  7:27 PM   Specimen: BLOOD  Result Value Ref Range Status   Specimen Description BLOOD LEFT ANTECUBITAL  Final   Special Requests   Final    BOTTLES DRAWN AEROBIC AND ANAEROBIC Blood Culture adequate volume   Culture   Final    NO GROWTH 3 DAYS Performed at [facility], [address] [place], [place] [address]    Report Status PENDING  Incomplete  SARS CORONAVIRUS 2 (TAT 6-24 HRS) Nasopharyngeal Nasopharyngeal Swab     Status: None   Collection Time: 10/02/18  8:31 PM   Specimen: Nasopharyngeal Swab  Result Value Ref Range Status   SARS Coronavirus 2 NEGATIVE NEGATIVE Final    Comment: (NOTE) SARS-CoV-2 target nucleic acids are NOT DETECTED. The SARS-CoV-2 RNA is generally detectable in upper and lower respiratory specimens during the acute phase of infection. Negative results do not preclude SARS-CoV-2 infection, do not rule out co-infections with other pathogens, and should not be used as the sole basis for treatment or other patient management decisions. Negative results must be combined with clinical observations, patient history, and epidemiological information. The expected result is Negative. Fact Sheet for Patients: HairSlick.nohttps://www.fda.gov/media/138098/download Fact Sheet for Healthcare Providers:  quierodirigir.comhttps://www.fda.gov/media/138095/download This test is not yet approved or cleared by the [place] FDA and  has been authorized for detection and/or diagnosis of SARS-CoV-2 by FDA under an Emergency Use Authorization (EUA). This EUA will remain  in effect (meaning this  test can be used) for the duration of the COVID-19 declaration under Section 56 4(b)(1) of the Act, 21 U.S.C. section 360bbb-3(b)(1), unless the authorization is terminated or revoked sooner. Performed at [facility], [address], [place], [place] [address]     RADIOLOGY:  No results found.   Management plans discussed with the patient, family and they are in agreement.  CODE STATUS:     Code Status Orders  (From admission, onward)         Start     Ordered   10/03/18 0016  Full code  Continuous     10/03/18 0015        Code Status History    Date Active Date Inactive Code Status Order ID Comments User Context   02/18/2018 2014 02/21/2018 1814 Full Code [id number]  Shaune Pollack, MD Inpatient   08/21/2017 1301 08/25/2017 1820 Full Code [id number]  Houston Siren, MD ED   01/06/2016 1752 01/08/2016 1456 Full Code [id number]  Hower, Cletis Athens, MD ED   Advance Care Planning Activity    Advance Directive Documentation     Most Recent Value  Type of Advance Directive  Healthcare Power of Attorney, Living will  Pre-existing out of facility DNR order (yellow form or pink MOST form)  -  "MOST" Form in Place?  -      TOTAL TIME TAKING CARE OF THIS PATIENT: 38  minutes.    Enid Baas M.D on 10/05/2018 at 11:38 AM  Between 7am to 6pm - Pager - [phone]  After 6pm go to [email or web address] - Social research officer, government  [facility]  Office  [phone]  CC: Primary care physician; Gracelyn Nurse, MD   Note: This dictation was prepared with Dragon dictation along with smaller phrase technology. Any transcriptional errors that result from this process are unintentional.

## 2018-10-06 NOTE — Telephone Encounter (Signed)
TCM....  Patient is being discharged   They saw Dr. Saunders Revel  They are scheduled to see Ignacia Bayley on 10/8  They were seen for new onset of afib  They need to be seen within 2 weeks  Pt not placed wait list   Please call

## 2018-10-06 NOTE — Telephone Encounter (Signed)
LMTCB for TOC call D/C 10/05/18

## 2018-10-07 LAB — CULTURE, BLOOD (ROUTINE X 2)
Culture: NO GROWTH
Culture: NO GROWTH
Special Requests: ADEQUATE
Special Requests: ADEQUATE

## 2018-10-11 NOTE — Telephone Encounter (Signed)
Patient family member returning call - is in clinic most of the day so it is difficult to get a hold of  Please call to discuss

## 2018-10-11 NOTE — Telephone Encounter (Signed)
2nd attempt.  No answer.  Left message to call back.

## 2018-10-12 NOTE — Telephone Encounter (Signed)
3rd attempt. No answer. Left message to call back.   

## 2018-10-19 ENCOUNTER — Other Ambulatory Visit: Payer: Self-pay

## 2018-10-19 ENCOUNTER — Encounter: Payer: Self-pay | Admitting: Nurse Practitioner

## 2018-10-19 ENCOUNTER — Ambulatory Visit (INDEPENDENT_AMBULATORY_CARE_PROVIDER_SITE_OTHER): Payer: Medicare Other | Admitting: Nurse Practitioner

## 2018-10-19 VITALS — BP 130/62 | HR 75 | Ht 62.0 in | Wt 152.5 lb

## 2018-10-19 DIAGNOSIS — W19XXXD Unspecified fall, subsequent encounter: Secondary | ICD-10-CM

## 2018-10-19 DIAGNOSIS — I480 Paroxysmal atrial fibrillation: Secondary | ICD-10-CM | POA: Diagnosis not present

## 2018-10-19 DIAGNOSIS — I10 Essential (primary) hypertension: Secondary | ICD-10-CM

## 2018-10-19 DIAGNOSIS — E782 Mixed hyperlipidemia: Secondary | ICD-10-CM

## 2018-10-19 NOTE — Patient Instructions (Signed)
Medication Instructions:  Your physician recommends that you continue on your current medications as directed. Please refer to the Current Medication list given to you today.  If you need a refill on your cardiac medications before your next appointment, please call your pharmacy.   Lab work: None ordered  If you have labs (blood work) drawn today and your tests are completely normal, you will receive your results only by: Marland Kitchen MyChart Message (if you have MyChart) OR . A paper copy in the mail If you have any lab test that is abnormal or we need to change your treatment, we will call you to review the results.  Testing/Procedures: None ordered   Follow-Up: At [facility], you and your health needs are our priority.  As part of our continuing mission to provide you with exceptional heart care, we have created designated Provider Care Teams.  These Care Teams include your primary Cardiologist (physician) and Advanced Practice Providers (APPs -  Physician Assistants and Nurse Practitioners) who all work together to provide you with the care you need, when you need it. You will need a follow up appointment in 2-3 months.  You may see Nelva Bush, MD or Murray Hodgkins, NP.

## 2018-10-19 NOTE — Progress Notes (Signed)
Office Visit    Patient Name: Kevin Bradshaw Date of Encounter: 10/19/2018  Primary Care Provider:  Gracelyn NurseJohnston, Kevin D, MD Primary Cardiologist:  Kevin Kendallhristopher End, MD  Chief Complaint    83 y/o ? with a history of hypertension, hyperlipidemia, Parkinson's disease, recurrent falls, and stroke, who presents for follow-up after recent hospitalization for urosepsis complicated by paroxysmal atrial fibrillation.  Past Medical History    Past Medical History:  Diagnosis Date  . Diastolic dysfunction    a. 09/2018 Echo: EF 55-60%, mild LVH. Diast dysfxn. Nl RV fxn. Nl LA size.  . Falls   . Hyperlipidemia   . Hypertension   . PAF (paroxysmal atrial fibrillation) (HCC)    a. 09/2018 in the setting of urosepsis; b. CHA2DS2VASc = 5-->No OAC due to high risk for falls (on ASA/Plavix).  . Parkinson disease (HCC)   . Stroke (HCC) 08/2017  . Urosepsis (HCC) 09/2018   Past Surgical History:  Procedure Laterality Date  . CARDIOVASCULAR STRESS TEST     normal  . CARPAL TUNNEL RELEASE  2009   right hand  . X-STOP IMPLANTATION    . X-STOP IMPLANTATION     Dr. Gerrit Heckaliff    Allergies  No Known Allergies  History of Present Illness    83 year old male with the above past medical history including hypertension, hyperlipidemia, Parkinson's disease, recurrent falls, and stroke.  In September of this year, he was admitted to [facility] with progressive weakness and multiple falls-up to twice a week.  He was diagnosed with urosepsis and was initially in sinus rhythm with PACs but subsequently developed paroxysmal atrial fibrillation.  He was placed on beta-blocker therapy and with clinical improvement, he converted to sinus rhythm.  He has been on aspirin and Plavix since his stroke in 2019 and because of recurrent falls and significant weakness as determined by physical therapy, we opted to hold off on initiation of oral anticoagulation.  He was discharged to a skilled nursing facility/rehab.  Kevin Bradshaw is in sinus rhythm today.  He says he has been working with physical therapy at rehab and believes he will be discharged home tomorrow.  He does not think his strength is greatly improved and says he is still very unsteady despite using a walker.  He does not think he has fallen since being at rehab but is not sure.  He denies any palpitations, chest pain, dyspnea, PND, orthopnea, dizziness, syncope, edema, or early satiety.  Home Medications    Prior to Admission medications   Medication Sig Start Date Bradshaw Date Taking? Authorizing Provider  acetaminophen (TYLENOL) 325 MG tablet Take 2 tablets (650 mg total) by mouth every 6 (six) hours as needed for mild pain (or Fever >/= 101). 02/21/18  Yes Gouru, Deanna ArtisAruna, MD  aspirin 81 MG tablet Take 81 mg by mouth daily.     Yes [provider]  atorvastatin (LIPITOR) 80 MG tablet Take 1 tablet (80 mg total) by mouth daily at 6 PM. 08/23/17  Yes Shaune Pollackhen, Qing, MD  clopidogrel (PLAVIX) 75 MG tablet Take 1 tablet (75 mg total) by mouth daily. 08/23/17  Yes Shaune Pollackhen, Qing, MD  metoprolol succinate (TOPROL XL) 50 MG 24 hr tablet Take 1 tablet (50 mg total) by mouth daily. Take with or immediately following a meal. 10/05/18 10/05/19 Yes Enid BaasKalisetti, Radhika, MD  tamsulosin (FLOMAX) 0.4 MG CAPS capsule Take 0.4 mg by mouth daily. 09/06/18 09/06/19 Yes [provider]  Calcium Carbonate (CALCIUM 500 PO) Take 2 tablets  by mouth daily.    01/06/16  [provider]    Review of Systems    Remains very unsteady on his feet despite using a walker.  He is not sure if he has fallen in the past 2 weeks.  He denies chest pain, palpitations, dyspnea, PND, orthopnea, dizziness, syncope, edema, or early satiety.  All other systems reviewed and are otherwise negative except as noted above.  Physical Exam    VS:  BP 130/62 (BP Location: Left Arm, Patient Position: Sitting, Cuff Size: Normal)   Pulse 75   Ht 5\' 2"  (1.575 m)   Wt 152 lb 8 oz (69.2 kg)    SpO2 95%   BMI 27.89 kg/m  , BMI Body mass index is 27.89 kg/m. GEN: Well nourished, well developed, in no acute distress. HEENT: normal. Neck: Supple, no JVD, carotid bruits, or masses. Cardiac: RRR, no murmurs, rubs, or gallops. No clubbing, cyanosis, edema.  Radials/PT 2+ and equal bilaterally.  Respiratory:  Respirations regular and unlabored, clear to auscultation bilaterally. GI: Soft, nontender, nondistended, BS + x 4. MS: no deformity or atrophy. Skin: warm and dry, no rash. Neuro:  Strength and sensation are intact. Psych: Normal affect.  Accessory Clinical Findings    ECG personally reviewed by me today -regular sinus rhythm, 75, inferior infarct- no acute changes.  Lab Results  Component Value Date   WBC 12.2 (H) 10/03/2018   HGB 10.6 (L) 10/03/2018   HCT 31.9 (L) 10/03/2018   MCV 87.9 10/03/2018   PLT 161 10/03/2018   Lab Results  Component Value Date   CREATININE 0.79 10/05/2018   BUN 18 10/05/2018   NA 138 10/05/2018   K 3.7 10/05/2018   CL 104 10/05/2018   CO2 24 10/05/2018   Lab Results  Component Value Date   ALT 20 10/02/2018   AST 28 10/02/2018   ALKPHOS 62 10/02/2018   BILITOT 1.6 (H) 10/02/2018   Lab Results  Component Value Date   CHOL 123 10/03/2018   HDL 34 (L) 10/03/2018   LDLCALC 68 10/03/2018   TRIG 107 10/03/2018   CHOLHDL 3.6 10/03/2018     Assessment & Plan    1.  Paroxysmal atrial fibrillation: Patient was recently admitted with urosepsis and subsequently developed paroxysmal A. fib with subsequent conversion to sinus rhythm on beta-blocker therapy and clinical improvement with treatment of urosepsis.  In the setting of unsteadiness with frequent falls, we opted to hold off on initiation of oral anticoagulation and instead continued aspirin and Plavix, which he has been on since his stroke in 2019.  Patient has been at skilled nursing/rehab since discharge and is due for discharge from there tomorrow.  He will be staying with his  daughter.  Patient says he is still very unsteady despite use of a walker.  He is not sure if he has fallen or not since his discharge from the hospital.  In light of ongoing stability issues, I will not initiate oral anticoagulation today (CHA2DS2VASc equals 5).  Continue beta-blocker therapy.  Plan to follow-up in approximately 2 months and if stability has improved and he is fall free, we can reconsider.  2.  Essential hypertension: Stable on beta-blocker therapy.  3.  Hyperlipidemia: LDL 68 on atorvastatin therapy.  ALT and AST within normal limits in September.  4.  Recurrent falls: As above, patient with multiple falls prior to admission and ongoing leg weakness and unsteadiness.  He does not sound particularly confident about his ability to ambulate  and as above, will hold off on oral anticoagulation at this time.  Reevaluate in 2 months.  5.  Disposition: Follow-up in clinic in 2 months or sooner if necessary.   Nicolasa Ducking, NP 10/19/2018, 9:22 AM

## 2018-10-24 NOTE — Addendum Note (Signed)
Addended by: Janan Ridge on: 10/24/2018 08:50 AM   Modules accepted: Orders

## 2018-12-20 ENCOUNTER — Ambulatory Visit: Payer: Medicare Other | Admitting: Internal Medicine

## 2018-12-20 NOTE — Progress Notes (Deleted)
Follow-up Outpatient Visit Date: 12/20/2018  Primary Care Provider: Gracelyn Nurse, MD [address] [place] [address]  Chief Complaint: ***  HPI:  Mr. Card is a 83 y.o. male with history of stroke, hypertension, hyperlipidemia, Parkinson's disease, prior tobacco use, and frequent falls, who presents for follow-up of atrial fibrillation.  He was hospitalized in late September with atrial fibrillation in the setting of cystitis and early sepsis as well as marked electrolyte abnormalities.  Long-term anticoagulation was deferred given history of frequent falls.  He was seen in follow-up by Ward Givens, NP, in early October, at which time he remained in sinus rhythm.  He continued to feel very unsteady and weak.  --------------------------------------------------------------------------------------------------  Past Medical History:  Diagnosis Date  . Diastolic dysfunction    a. 09/2018 Echo: EF 55-60%, mild LVH. Diast dysfxn. Nl RV fxn. Nl LA size.  . Falls   . Hyperlipidemia   . Hypertension   . PAF (paroxysmal atrial fibrillation) (HCC)    a. 09/2018 in the setting of urosepsis; b. CHA2DS2VASc = 5-->No OAC due to high risk for falls (on ASA/Plavix).  . Parkinson disease (HCC)   . Stroke (HCC) 08/2017  . Urosepsis (HCC) 09/2018   Past Surgical History:  Procedure Laterality Date  . CARDIOVASCULAR STRESS TEST     normal  . CARPAL TUNNEL RELEASE  2009   right hand  . X-STOP IMPLANTATION    . X-STOP IMPLANTATION     Dr. Gerrit Heck    No outpatient medications have been marked as taking for the 12/20/18 encounter (Appointment) with Cabrina Shiroma, Cristal Deer, MD.    Allergies: Patient has no known allergies.  Social History   Tobacco Use  . Smoking status: Former Smoker    Packs/day: 1.50    Years: 50.00    Pack years: 75.00    Types: Cigarettes    Quit date: 11/04/2002    Years since quitting: 16.1  . Smokeless tobacco: Never Used  Substance Use Topics  . Alcohol  use: Yes    Comment: once a week  . Drug use: No    Family History  Problem Relation Age of Onset  . Heart disease Father 51  . Cancer Sister        ? type  . Heart disease Brother   . Cancer Brother        ? type  . Stroke Child   . Seizures Child     Review of Systems: A 12-system review of systems was performed and was negative except as noted in the HPI.  --------------------------------------------------------------------------------------------------  Physical Exam: There were no vitals taken for this visit.  General:  *** HEENT: No conjunctival pallor or scleral icterus. Facemask in place. Neck: Supple without lymphadenopathy, thyromegaly, JVD, or HJR. Lungs: Normal work of breathing. Clear to auscultation bilaterally without wheezes or crackles. Heart: Regular rate and rhythm without murmurs, rubs, or gallops. Non-displaced PMI. Abd: Bowel sounds present. Soft, NT/ND without hepatosplenomegaly Ext: No lower extremity edema. Radial, PT, and DP pulses are 2+ bilaterally. Skin: Warm and dry without rash.  EKG:  ***  Lab Results  Component Value Date   WBC 12.2 (H) 10/03/2018   HGB 10.6 (L) 10/03/2018   HCT 31.9 (L) 10/03/2018   MCV 87.9 10/03/2018   PLT 161 10/03/2018    Lab Results  Component Value Date   NA 138 10/05/2018   K 3.7 10/05/2018   CL 104 10/05/2018   CO2 24 10/05/2018   BUN 18 10/05/2018  CREATININE 0.79 10/05/2018   GLUCOSE 97 10/05/2018   ALT 20 10/02/2018    Lab Results  Component Value Date   CHOL 123 10/03/2018   HDL 34 (L) 10/03/2018   LDLCALC 68 10/03/2018   TRIG 107 10/03/2018   CHOLHDL 3.6 10/03/2018    --------------------------------------------------------------------------------------------------  ASSESSMENT AND PLAN: Harrell Gave Errika Narvaiz, MD 12/20/2018 7:20 AM

## 2019-03-15 ENCOUNTER — Other Ambulatory Visit: Payer: Self-pay | Admitting: Oncology

## 2019-03-15 DIAGNOSIS — R911 Solitary pulmonary nodule: Secondary | ICD-10-CM

## 2019-03-15 NOTE — Progress Notes (Signed)
  Pulmonary Nodule Clinic Telephone Note  Received referral from Dr. Letitia Libra (PCP)  HPI:  Patient was admitted back in September 2020 for progressive weakness and multiple falls.  He was diagnosed with urosepsis and treated.  Subsequent imaging revealed a 10 x 4 mm stone in the distal common bile duct with dilatation.  Incidentally noted to have a 5 mm pulmonary nodule in the anterior aspect of the right middle lobe.  It was recommended he have a repeat chest CT in approximately 12 months.  PMH: Past Medical History:  Diagnosis Date  . Diastolic dysfunction    a. 09/2018 Echo: EF 55-60%, mild LVH. Diast dysfxn. Nl RV fxn. Nl LA size.  . Falls   . Hyperlipidemia   . Hypertension   . PAF (paroxysmal atrial fibrillation) (HCC)    a. 09/2018 in the setting of urosepsis; b. CHA2DS2VASc = 5-->No OAC due to high risk for falls (on ASA/Plavix).  . Parkinson disease (HCC)   . Stroke (HCC) 08/2017  . Urosepsis (HCC) 09/2018   Review and Recommendations: I personally reviewed all imaging in his chart including several chest x-rays.  Most recent was from 01/06/2016 which showed a right middle lobe infiltrate.  Imaging from 11/09/2010 chest x-ray revealed no acute findings.  I recommend follow-up with noncontrasted CT scan of the chest in 12 months from previous.  Social History: Per chart review patient quit smoking approximately 15 years ago.  He smoked cigarettes.  He has a 30-pack-year history of smoking.  He also reports current alcohol use but denies any drug use.  High risk factors include: History of heavy smoking, exposure to asbestos, radium or uranium, personal family history of lung cancer, older age, sex (females greater than males), race (black and native [place] greater than weight), marginal speculation, upper lobe location, multiplicity (less than 5 nodules increases risk for malignancy) and emphysema and/or pulmonary fibrosis.   This recommendation follows the consensus statement:  Guidelines for Management of Incidental Pulmonary Nodules Detected on CT Images: From the Fleischner Society 2017; Radiology 2017; 284:228-243.    I have placed order for CT scan without contrast to be completed approximately 12 months from previous.  Disposition: Order placed for repeat CT chest. Will notify Micael Hampshire in scheduling. [address] to call patient with appointment date and time. Return to pulmonary nodule clinic a few days after his repeat imaging to discuss results and plan moving forward.  Durenda Hurt, NP 03/15/2019 9:54 AM

## 2019-06-20 ENCOUNTER — Telehealth: Payer: Self-pay | Admitting: *Deleted

## 2019-06-20 NOTE — Telephone Encounter (Signed)
Referral received for pt to be seen in Lung Nodule Clinic for further workup of incidental lung nodule with follow up CT scan. Left message with patient to call back to discuss clinic and review recommendations and upcoming appts including follow up CT scan and visit with Jenny Burns, NP to discuss results. Awaiting call back.  

## 2019-06-20 NOTE — Telephone Encounter (Signed)
Spoke with pt's daughter, [name], and she was informed of all recommendations. Stated she would like to discuss the appts with her dad before confirming. [name] stated she will call back.

## 2019-08-13 ENCOUNTER — Telehealth: Payer: Self-pay | Admitting: *Deleted

## 2019-08-13 NOTE — Telephone Encounter (Signed)
Follow up call made to pt's daughter to confirm appts for repeat imaging and follow up in the lung nodule clinic in Sept. Instructed to call back to confirm appts. Awaiting call back.

## 2019-09-20 ENCOUNTER — Telehealth: Payer: Self-pay | Admitting: *Deleted

## 2019-09-20 NOTE — Telephone Encounter (Signed)
Attempted to contact pts daughter to review upcoming appts in the lung nodule clinic. Left message with appt details. Instructed to call back if needs to cancel/reschedule. Appts mailed as well.

## 2019-10-02 ENCOUNTER — Ambulatory Visit: Payer: Medicare HMO | Attending: Oncology

## 2019-10-03 ENCOUNTER — Inpatient Hospital Stay: Payer: Medicare HMO | Admitting: Oncology

## 2019-10-15 IMAGING — CT CT HEAD W/O CM
3 of 7 series · 12 of 47 positions shown, 14 images · non-contrast
Comparison: 08/21/2017

CLINICAL DATA: Head trauma. Multiple unwitnessed falls over the
last 2 days.

EXAM:
CT HEAD WITHOUT CONTRAST
TECHNIQUE: Contiguous axial images were obtained from the base of the skull
through the vertex without intravenous contrast.

[Series 3: head wo · axial · 0.41mm/px · z∈[-161,-46]mm · 7 of 29 slices shown, 9 images]
[im 3/29  brain]
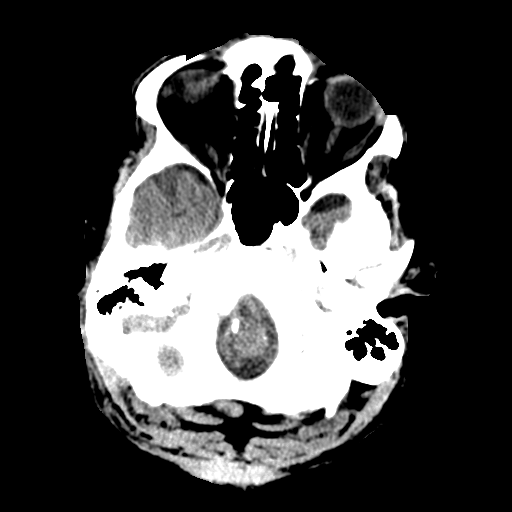
[im 3/29  bone]
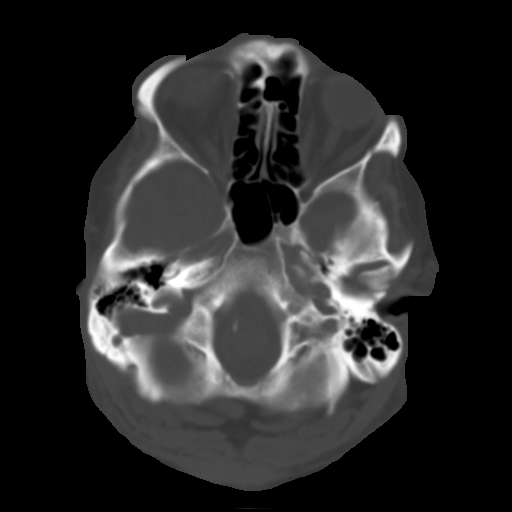
[im 7/29  brain]
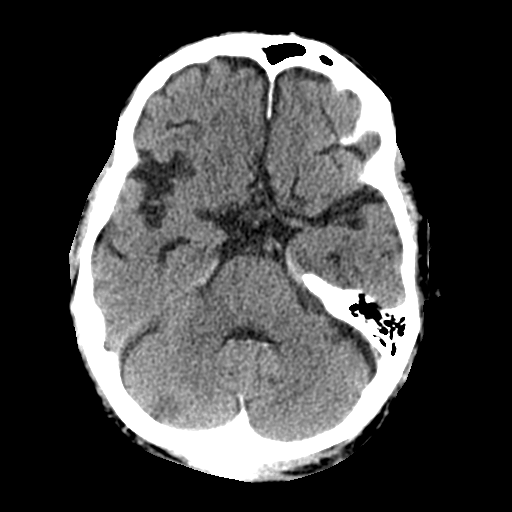
[im 11/29  brain]
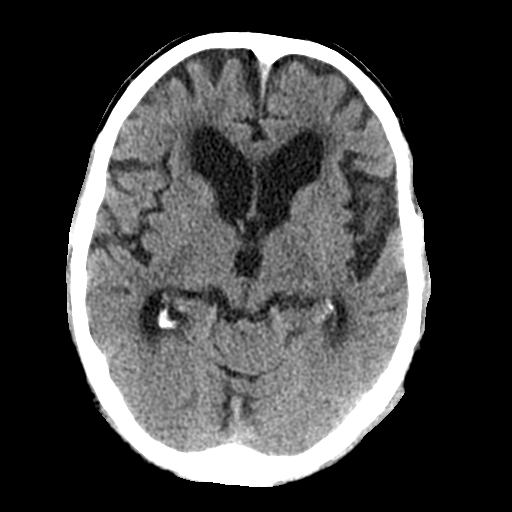
[im 16/29  brain]
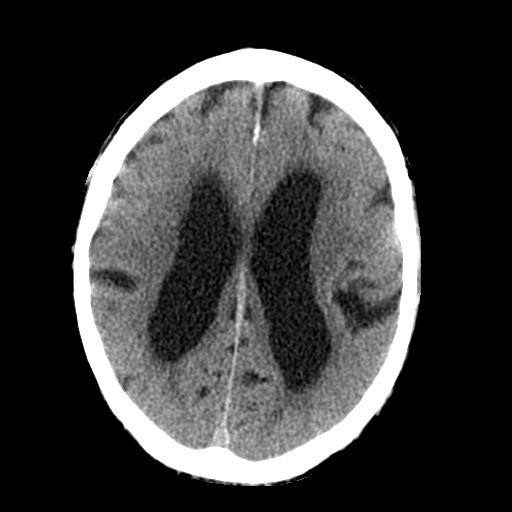
[im 18/29  brain]
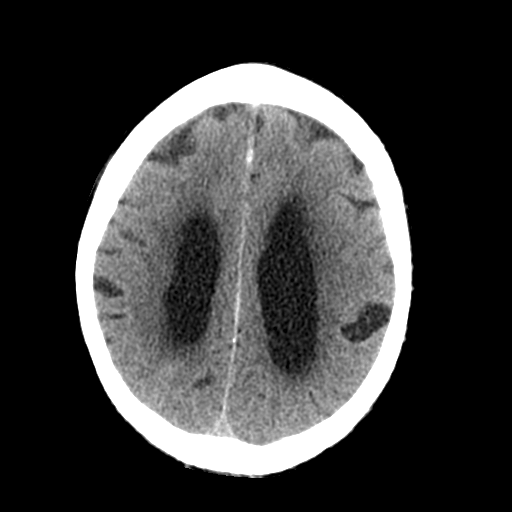
[im 18/29  bone]
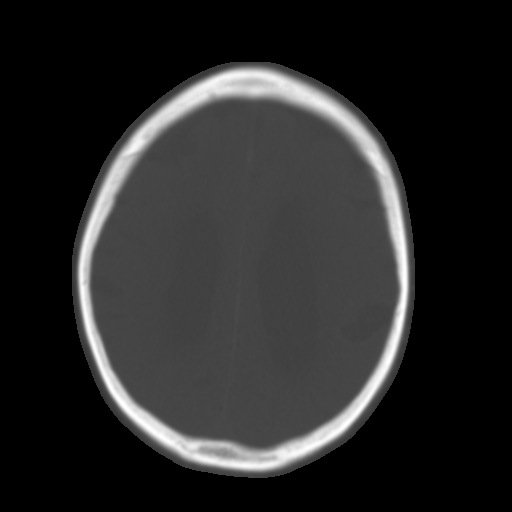
[im 22/29  brain]
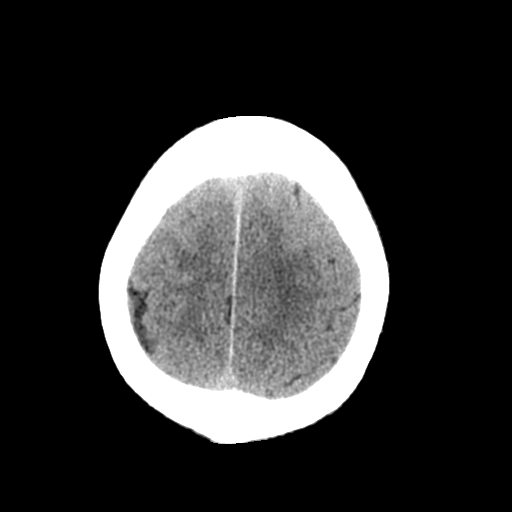
[im 26/29  brain]
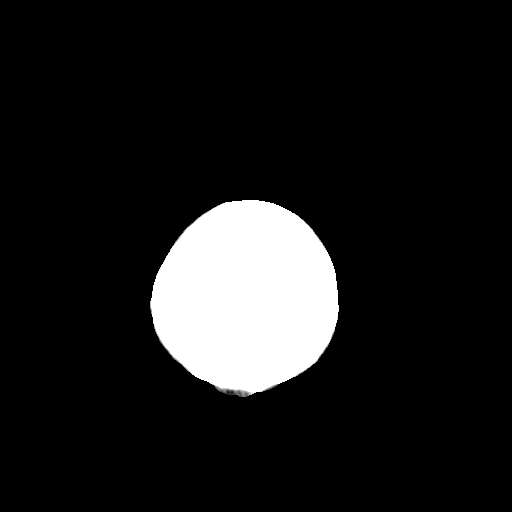

[Series 4: coronal soft tissue · coronal · 0.27mm/px · 3 of 67 slices shown]
[im 17/67  brain]
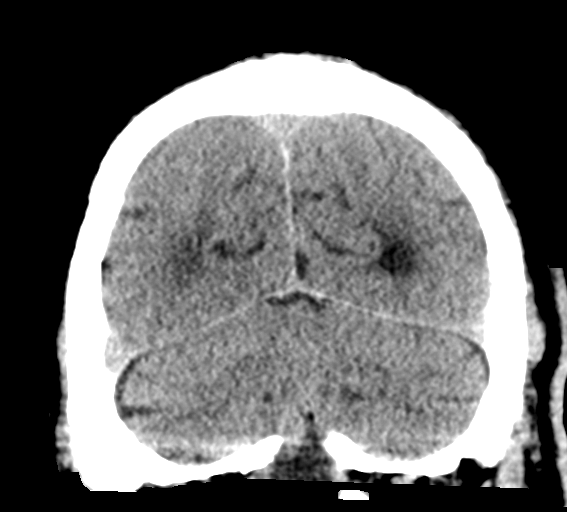
[im 34/67  brain]
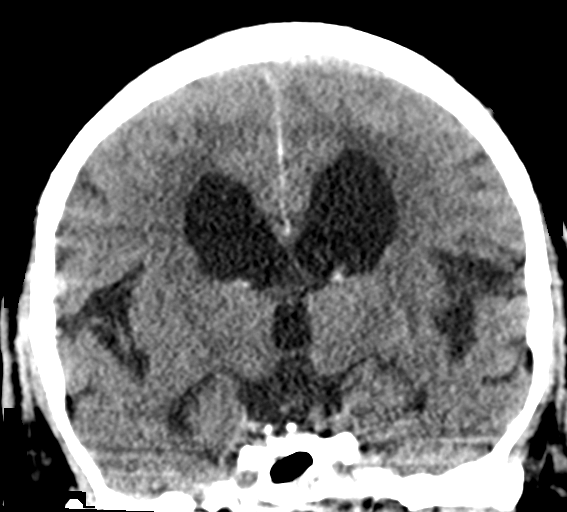
[im 50/67  brain]
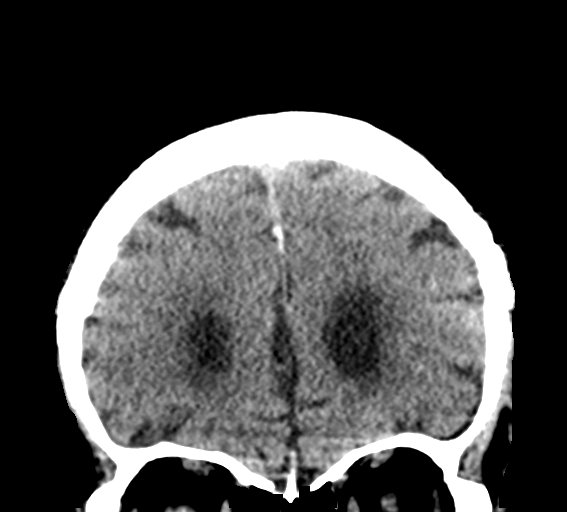

[Series 9: sagittal soft tissue · sagittal · 0.10mm/px · 2 of 57 slices shown]
[im 19/57  brain]
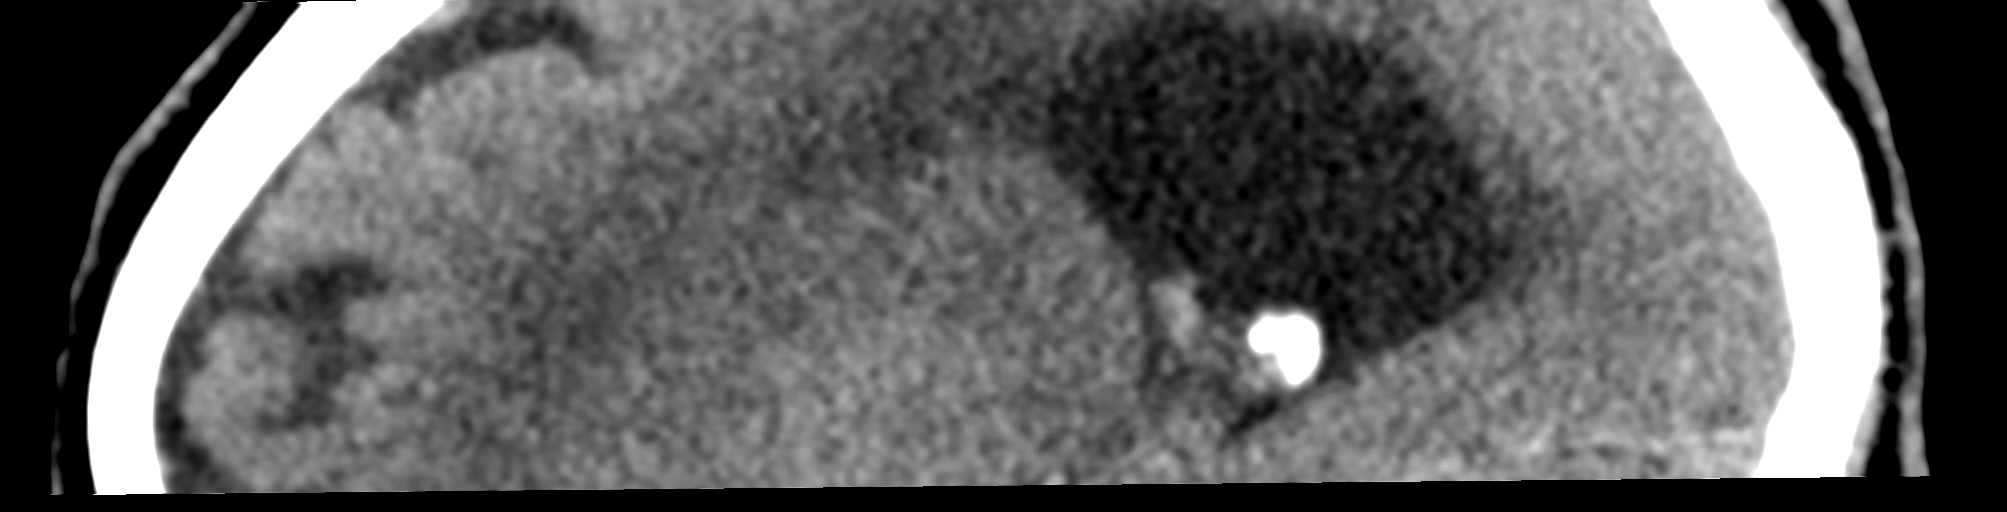
[im 38/57  brain]
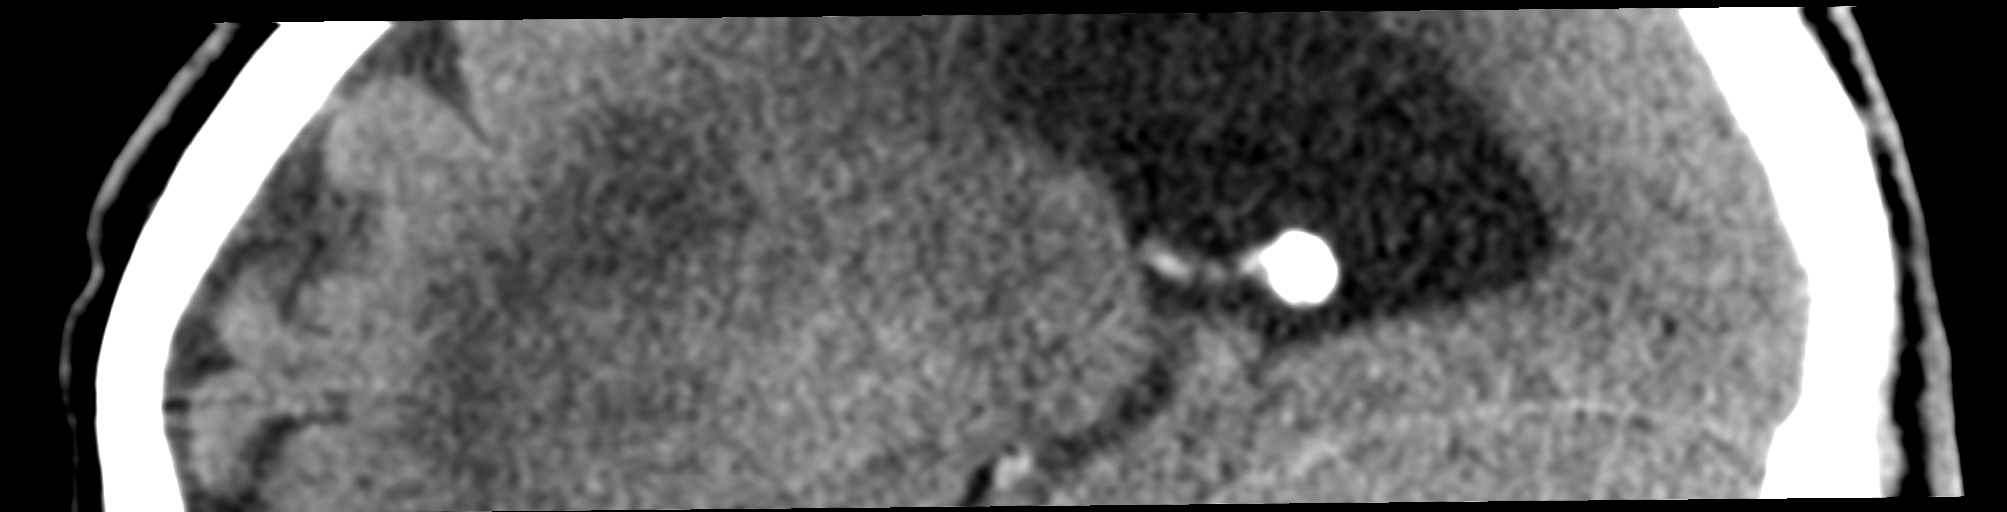

[12 of 47 positions shown; findings below may reference images not displayed]

FINDINGS: Brain: No evidence of acute infarction, hemorrhage, hydrocephalus,
extra-axial collection or mass lesion/mass effect. There is mild
diffuse low-attenuation within the subcortical and periventricular
white matter compatible with chronic microvascular disease. Chronic
left basal ganglia lacunar infarct. Prominence of the sulci and
ventricles compatible with brain atrophy.

Vascular: No hyperdense vessel or unexpected calcification.

Skull: Normal. Negative for fracture or focal lesion.

Sinuses/Orbits: No acute finding.

Other: None
IMPRESSION: 1. No acute intracranial abnormality.
2. Chronic small vessel ischemic disease with brain atrophy.
3. Old left basal ganglia lacunar infarct.

## 2019-10-17 ENCOUNTER — Encounter: Payer: Self-pay | Admitting: *Deleted

## 2019-10-17 NOTE — Progress Notes (Signed)
Pt missed scheduled appts for follow up CT scan and follow up visit with Durenda Hurt, NP. Letter mailed to pt to call back to reschedule appts.

## 2019-10-25 ENCOUNTER — Encounter: Payer: Self-pay | Admitting: *Deleted

## 2019-10-25 NOTE — Progress Notes (Signed)
Reminder letter mailed to patient on 10/02/19 to reschedule appts to follow up incidental lung nodule. Unable to contact pt to reschedule appts. PCP has been made aware.

## 2020-10-07 ENCOUNTER — Other Ambulatory Visit: Payer: Self-pay

## 2020-10-07 ENCOUNTER — Emergency Department (HOSPITAL_COMMUNITY)
Admission: EM | Admit: 2020-10-07 | Discharge: 2020-10-08 | Disposition: A | Discharge status: Home / Self Care | Payer: Medicare HMO | Attending: Emergency Medicine | Admitting: Emergency Medicine

## 2020-10-07 ENCOUNTER — Encounter (HOSPITAL_COMMUNITY): Payer: Self-pay | Admitting: Emergency Medicine

## 2020-10-07 ENCOUNTER — Emergency Department (HOSPITAL_COMMUNITY): Payer: Medicare HMO

## 2020-10-07 DIAGNOSIS — U071 COVID-19: Secondary | ICD-10-CM | POA: Insufficient documentation

## 2020-10-07 DIAGNOSIS — Z79899 Other long term (current) drug therapy: Secondary | ICD-10-CM | POA: Diagnosis not present

## 2020-10-07 DIAGNOSIS — Z7982 Long term (current) use of aspirin: Secondary | ICD-10-CM | POA: Insufficient documentation

## 2020-10-07 DIAGNOSIS — Z7902 Long term (current) use of antithrombotics/antiplatelets: Secondary | ICD-10-CM | POA: Diagnosis not present

## 2020-10-07 DIAGNOSIS — G20 Parkinson's disease: Secondary | ICD-10-CM | POA: Insufficient documentation

## 2020-10-07 DIAGNOSIS — I10 Essential (primary) hypertension: Secondary | ICD-10-CM | POA: Insufficient documentation

## 2020-10-07 DIAGNOSIS — Z87891 Personal history of nicotine dependence: Secondary | ICD-10-CM | POA: Insufficient documentation

## 2020-10-07 DIAGNOSIS — R531 Weakness: Secondary | ICD-10-CM | POA: Diagnosis present

## 2020-10-07 LAB — CBC
HCT: 45 % (ref 39.0–52.0)
Hemoglobin: 14.3 g/dL (ref 13.0–17.0)
MCH: 30.7 pg (ref 26.0–34.0)
MCHC: 31.8 g/dL (ref 30.0–36.0)
MCV: 96.6 fL (ref 80.0–100.0)
Platelets: 147 10*3/uL — ABNORMAL LOW (ref 150–400)
RBC: 4.66 MIL/uL (ref 4.22–5.81)
RDW: 13.4 % (ref 11.5–15.5)
WBC: 6 10*3/uL (ref 4.0–10.5)
nRBC: 0 % (ref 0.0–0.2)

## 2020-10-07 LAB — BASIC METABOLIC PANEL
Anion gap: 11 (ref 5–15)
BUN: 10 mg/dL (ref 8–23)
CO2: 21 mmol/L — ABNORMAL LOW (ref 22–32)
Calcium: 9.2 mg/dL (ref 8.9–10.3)
Chloride: 107 mmol/L (ref 98–111)
Creatinine, Ser: 1.03 mg/dL (ref 0.61–1.24)
GFR, Estimated: >60 mL/min (ref >60)
Glucose, Bld: 93 mg/dL (ref 70–99)
Potassium: 4.2 mmol/L (ref 3.5–5.1)
Sodium: 139 mmol/L (ref 135–145)

## 2020-10-07 LAB — TROPONIN I (HIGH SENSITIVITY)
Troponin I (High Sensitivity): 15 ng/L (ref <18)
Troponin I (High Sensitivity): 24 ng/L — ABNORMAL HIGH (ref <18)
Troponin I (High Sensitivity): 24 ng/L — ABNORMAL HIGH (ref <18)

## 2020-10-07 LAB — RESP PANEL BY RT-PCR (FLU A&B, COVID) ARPGX2
Influenza A by PCR: NEGATIVE
Influenza B by PCR: NEGATIVE
SARS Coronavirus 2 by RT PCR: POSITIVE — AB

## 2020-10-07 LAB — LIPASE, BLOOD: Lipase: 35 U/L (ref 11–51)

## 2020-10-07 MED ORDER — LACTATED RINGERS IV BOLUS
500.0000 mL | Freq: Once | INTRAVENOUS | Status: AC
  Administered 2020-10-07: 500 mL via INTRAVENOUS

## 2020-10-07 NOTE — ED Triage Notes (Signed)
Pt from home where he lives with multiple family members who have covid. Pt was weaker than normal when he got up to walk with his walker as usual today. Did fall but no LOC and did not hit head. Also reports dizziness x 1 month, worse with movement.

## 2020-10-07 NOTE — ED Provider Notes (Signed)
[facility] EMERGENCY DEPARTMENT Provider Note  CSN: [id number] Arrival date & time: 10/07/20 1216    History No chief complaint on file.   Kevin Bradshaw is a 85 y.o. male with history of parkinson's brought in by family for evaluation of generalized weakness. Grand-daughter at bedside states patient gets weak whenever he is sick. He has had several family members that are his caregivers diagnosed with Covid recently. HE has had a cough for the last several days, no fevers. Today he was trying to use his walker as usual when his legs gave out. He did not injure himself when he fell. Grand-daughter states his family are unable to care for him now because they are also too sick. He denies any pain to me.    Past Medical History:  Diagnosis Date  . Diastolic dysfunction    a. 09/2018 Echo: EF 55-60%, mild LVH. Diast dysfxn. Nl RV fxn. Nl LA size.  . Falls   . Hyperlipidemia   . Hypertension   . PAF (paroxysmal atrial fibrillation) (HCC)    a. 09/2018 in the setting of urosepsis; b. CHA2DS2VASc = 5-->No OAC due to high risk for falls (on ASA/Plavix).  . Parkinson disease (HCC)   . Stroke (HCC) 08/2017  . Urosepsis (HCC) 09/2018    Past Surgical History:  Procedure Laterality Date  . CARDIOVASCULAR STRESS TEST     normal  . CARPAL TUNNEL RELEASE  2009   right hand  . X-STOP IMPLANTATION    . X-STOP IMPLANTATION     Dr. Gerrit Heck    Family History  Problem Relation Age of Onset  . Heart disease Father 50  . Cancer Sister        ? type  . Heart disease Brother   . Cancer Brother        ? type  . Stroke Child   . Seizures Child     Social History   Tobacco Use  . Smoking status: Former    Packs/day: 1.50    Years: 50.00    Pack years: 75.00    Types: Cigarettes    Quit date: 11/04/2002    Years since quitting: 17.9  . Smokeless tobacco: Never  Vaping Use  . Vaping Use: Never used  Substance Use Topics  . Alcohol use: Yes    Comment: once a week  . Drug use:  No     Home Medications Prior to Admission medications   Medication Sig Start Date End Date Taking? Authorizing Provider  acetaminophen (TYLENOL) 325 MG tablet Take 2 tablets (650 mg total) by mouth every 6 (six) hours as needed for mild pain (or Fever >/= 101). 02/21/18   Ramonita Lab, MD  aspirin 81 MG tablet Take 81 mg by mouth daily.      [provider]  atorvastatin (LIPITOR) 80 MG tablet Take 1 tablet (80 mg total) by mouth daily at 6 PM. 08/23/17   Shaune Pollack, MD  clopidogrel (PLAVIX) 75 MG tablet Take 1 tablet (75 mg total) by mouth daily. 08/23/17   Shaune Pollack, MD  metoprolol succinate (TOPROL XL) 50 MG 24 hr tablet Take 1 tablet (50 mg total) by mouth daily. Take with or immediately following a meal. 10/05/18 10/05/19  Enid Baas, MD  Calcium Carbonate (CALCIUM 500 PO) Take 2 tablets by mouth daily.    01/06/16  [provider]     Allergies    Patient has no known allergies.   Review of Systems  Review of Systems A comprehensive review of systems was completed and negative except as noted in HPI.    Physical Exam BP 124/82   Pulse 99   Temp 98.5 F (36.9 C) (Oral)   Resp 20   SpO2 94%   Physical Exam Vitals and nursing note reviewed.  Constitutional:      Appearance: Normal appearance.  HENT:     Head: Normocephalic and atraumatic.     Nose: Nose normal.     Mouth/Throat:     Mouth: Mucous membranes are moist.  Eyes:     Extraocular Movements: Extraocular movements intact.     Conjunctiva/sclera: Conjunctivae normal.  Cardiovascular:     Rate and Rhythm: Normal rate.  Pulmonary:     Effort: Pulmonary effort is normal.     Breath sounds: Normal breath sounds.  Abdominal:     General: Abdomen is flat.     Palpations: Abdomen is soft.     Tenderness: There is no abdominal tenderness.  Musculoskeletal:        General: No swelling. Normal range of motion.     Cervical back: Neck supple.  Skin:    General: Skin is warm and dry.   Neurological:     General: No focal deficit present.     Mental Status: He is alert.     Comments: Moves all extremities but slowly, mild tremor  Psychiatric:        Mood and Affect: Mood normal.     ED Results / Procedures / Treatments   Labs (all labs ordered are listed, but only abnormal results are displayed) Labs Reviewed  RESP PANEL BY RT-PCR (FLU A&B, COVID) ARPGX2 - Abnormal; Notable for the following components:      Result Value   SARS Coronavirus 2 by RT PCR POSITIVE (*)    All other components within normal limits  BASIC METABOLIC PANEL - Abnormal; Notable for the following components:   CO2 21 (*)    All other components within normal limits  CBC - Abnormal; Notable for the following components:   Platelets 147 (*)    All other components within normal limits  LIPASE, BLOOD  TROPONIN I (HIGH SENSITIVITY)  TROPONIN I (HIGH SENSITIVITY)    EKG EKG Interpretation  Date/Time:  Tuesday October 07 2020 13:03:55 EDT Ventricular Rate:  107 PR Interval:  180 QRS Duration: 70 QT Interval:  324 QTC Calculation: 432 R Axis:   70 Text Interpretation: indeterminate rhythm Possible Inferior infarct , age undetermined Abnormal ECG Artifact Serial tracing suggested Confirmed by Mancel Bale [phone]) on 10/07/2020 4:39:17 PM  Radiology DG Chest 2 View  Result Date: 10/07/2020 CLINICAL DATA:  Back pain EXAM: CHEST - 2 VIEW COMPARISON:  Chest x-ray dated February 18, 2018 FINDINGS: Cardiac and mediastinal contours are unchanged and within normal limits. Unchanged elevation of the right hemidiaphragm. Mild bibasilar opacities, likely atelectasis. No evidence of pleural effusion or pneumothorax. IMPRESSION: Mild bibasilar opacities, likely atelectasis. Electronically Signed   By: Allegra Lai M.D.   On: 10/07/2020 13:38    Procedures Procedures  Medications Ordered in the ED Medications  lactated ringers bolus 500 mL (has no administration in time range)     MDM  Rules/Calculators/A&P MDM Patient with Covid+ family members here for cough and general weakness, unable to ambulate now. Covid here confirmed positive, otherwise his CXR and labs are unremarkable. Not hypoxic or febrile.   ED Course  I have reviewed the triage vital signs and the nursing notes.  Pertinent labs & imaging results that were available during my care of the patient were reviewed by me and considered in my medical decision making (see chart for details).  Clinical Course as of 10/08/20 1029  Tue Oct 07, 2020  1803 Spoke with FM resident, unfortunately, this patient does not meet any criteria for a medical admission. Given challenges with his home care situation, will consult TOC to help with either home health resources or SNF placement.  [CS]  2013 Patient evaluated by TOC. No available SNF beds for Covid patients. She spoke with family and will refer for home health resources.  [CS]  2014 Patient had repeat Trop ordered from triage which is borderline elevated, but no reports of chest pain and no obvious ischemic changes on EKG however there was tremor artifact. Will check a repeat to compare.  [CS]  2036 Repeat EKG without signs of ischemia. He is sitting up in bed, eating dinner without complaints. Denies any chest pains now or earlier today. Doubt this mild change in Trop is significant but to be sure, we will check a third.  [CS]  2233 Third trop remains at 24, no concern for ACS. Patient remains asymptomatic, will d/c home per [facility] plans.  [CS]    Clinical Course User Index [CS] Pollyann Savoy, MD    Final Clinical Impression(s) / ED Diagnoses Final diagnoses:  None    Rx / DC Orders ED Discharge Orders     None        Pollyann Savoy, MD 10/08/20 1029

## 2020-10-07 NOTE — Social Work (Signed)
CSW spoke with Pt's daughter [name] @[phone] .  CSW explained barriers to SNF placement to daughter.  Daughter expressed frustration, but stated that she is willing to have TOC team refer Pt to [facility].

## 2020-10-07 NOTE — Care Management (Addendum)
ED RN Care Manager spoke with Granddaughter concerning HH recommendation. She reports that the family member who is cares for patient has Covid as well, and they would like the patient to be admitted or go to a SNF.  Explained that patient does not meet admission criteria at this time, and the only option would be home with [facility], because there aren't any SNF with Covid beds. Discussed with EDP and the ED CSW.  Plan is patient home with [facility] services

## 2020-10-07 NOTE — Care Management (Signed)
Patient will be discharged home and transported home by [facility], ED nursing staff made aware.

## 2020-10-07 NOTE — ED Notes (Signed)
RN called and spoke to daughter, verifying address and informing her he was on PTARs list to transport home.  She asked that we call when they get there.

## 2020-10-07 NOTE — ED Provider Notes (Signed)
Emergency Medicine Provider Triage Evaluation Note  Kevin Bradshaw , a 85 y.o. male  was evaluated in triage.  Pt complains of thoracic back pain.  States that he started having mid back pain in the center of his back this morning started suddenly.  He also has felt some generalized weakness throughout the day.  He says the pain comes and goes.  He is unable to describe it for me.  He denies anything making it better or worse.  He has had this before but is unclear on the circumstances of the incidents.  He says he has associated lightheadedness.  He reportedly also had a fall today with no loss of consciousness and did not hit the head.  Denies any chest pain, abdominal pain, nausea, vomiting.  He denies being a current smoker but has smoked in the past.  He is on Plavix.  He denies any cardiac history.  He does have a history of hypertension, hyperlipidemia, stroke, A. fib.  Review of Systems  Positive: Back pain, generalized weakness, lightheadedness Negative: Chest pain, abdominal pain, nausea, vomiting, shortness of breath.  Physical Exam  BP (!) 131/102 (BP Location: Right Arm)   Pulse (!) 111   Temp 98.9 F (37.2 C) (Oral)   Resp 18   SpO2 95%  Gen:   Awake, no distress   Resp:  Normal effort.  Lung sounds clear to auscultation. MSK:   Moves extremities without difficulty  Other:  Heart sounds with S1 and S2 with no murmurs noted.  Back pain was not reproducible with palpation.  No leg swelling.  No generalized abdominal tenderness.  No pulsatile area when viewing abdomen.  Medical Decision Making  Medically screening exam initiated at 12:49 PM.  Appropriate orders placed.  Kevin Bradshaw was informed that the remainder of the evaluation will be completed by another provider, this initial triage assessment does not replace that evaluation, and the importance of remaining in the ED until their evaluation is complete.    Kevin Bradshaw 10/07/20 1252    Kevin Bale,  MD 10/07/20 [phone]

## 2020-10-08 NOTE — ED Notes (Signed)
PTAR here, Daughter called and informed they would be bringing pt home now.

## 2022-03-24 ENCOUNTER — Emergency Department: Payer: Medicare HMO

## 2022-03-24 ENCOUNTER — Inpatient Hospital Stay
Admission: EM | Admit: 2022-03-24 | Discharge: 2022-04-01 | DRG: 062 | Disposition: A | Discharge status: Skilled Nursing Facility | Payer: Medicare HMO | Attending: Osteopathic Medicine | Admitting: Osteopathic Medicine

## 2022-03-24 ENCOUNTER — Other Ambulatory Visit: Payer: Self-pay

## 2022-03-24 ENCOUNTER — Encounter: Payer: Self-pay | Admitting: Radiology

## 2022-03-24 DIAGNOSIS — Z9181 History of falling: Secondary | ICD-10-CM | POA: Diagnosis not present

## 2022-03-24 DIAGNOSIS — R29707 NIHSS score 7: Secondary | ICD-10-CM | POA: Diagnosis present

## 2022-03-24 DIAGNOSIS — F02B0 Dementia in other diseases classified elsewhere, moderate, without behavioral disturbance, psychotic disturbance, mood disturbance, and anxiety: Secondary | ICD-10-CM | POA: Diagnosis present

## 2022-03-24 DIAGNOSIS — I5032 Chronic diastolic (congestive) heart failure: Secondary | ICD-10-CM | POA: Diagnosis present

## 2022-03-24 DIAGNOSIS — I480 Paroxysmal atrial fibrillation: Secondary | ICD-10-CM | POA: Diagnosis not present

## 2022-03-24 DIAGNOSIS — N400 Enlarged prostate without lower urinary tract symptoms: Secondary | ICD-10-CM | POA: Diagnosis present

## 2022-03-24 DIAGNOSIS — R296 Repeated falls: Secondary | ICD-10-CM | POA: Diagnosis present

## 2022-03-24 DIAGNOSIS — R414 Neurologic neglect syndrome: Secondary | ICD-10-CM | POA: Diagnosis not present

## 2022-03-24 DIAGNOSIS — Z87891 Personal history of nicotine dependence: Secondary | ICD-10-CM | POA: Diagnosis not present

## 2022-03-24 DIAGNOSIS — I639 Cerebral infarction, unspecified: Secondary | ICD-10-CM

## 2022-03-24 DIAGNOSIS — Z7982 Long term (current) use of aspirin: Secondary | ICD-10-CM

## 2022-03-24 DIAGNOSIS — R471 Dysarthria and anarthria: Secondary | ICD-10-CM | POA: Diagnosis not present

## 2022-03-24 DIAGNOSIS — E785 Hyperlipidemia, unspecified: Secondary | ICD-10-CM | POA: Diagnosis present

## 2022-03-24 DIAGNOSIS — I110 Hypertensive heart disease with heart failure: Secondary | ICD-10-CM | POA: Diagnosis present

## 2022-03-24 DIAGNOSIS — G40909 Epilepsy, unspecified, not intractable, without status epilepticus: Secondary | ICD-10-CM | POA: Diagnosis present

## 2022-03-24 DIAGNOSIS — R4701 Aphasia: Secondary | ICD-10-CM | POA: Diagnosis present

## 2022-03-24 DIAGNOSIS — Z8249 Family history of ischemic heart disease and other diseases of the circulatory system: Secondary | ICD-10-CM | POA: Diagnosis not present

## 2022-03-24 DIAGNOSIS — G214 Vascular parkinsonism: Secondary | ICD-10-CM

## 2022-03-24 DIAGNOSIS — I4819 Other persistent atrial fibrillation: Secondary | ICD-10-CM | POA: Diagnosis present

## 2022-03-24 DIAGNOSIS — Z751 Person awaiting admission to adequate facility elsewhere: Secondary | ICD-10-CM

## 2022-03-24 DIAGNOSIS — I6310 Cerebral infarction due to embolism of unspecified precerebral artery: Secondary | ICD-10-CM | POA: Diagnosis not present

## 2022-03-24 DIAGNOSIS — I63411 Cerebral infarction due to embolism of right middle cerebral artery: Principal | ICD-10-CM | POA: Diagnosis present

## 2022-03-24 DIAGNOSIS — Z823 Family history of stroke: Secondary | ICD-10-CM | POA: Diagnosis not present

## 2022-03-24 DIAGNOSIS — R531 Weakness: Secondary | ICD-10-CM | POA: Diagnosis not present

## 2022-03-24 DIAGNOSIS — Z8673 Personal history of transient ischemic attack (TIA), and cerebral infarction without residual deficits: Secondary | ICD-10-CM | POA: Diagnosis present

## 2022-03-24 DIAGNOSIS — G20A1 Parkinson's disease without dyskinesia, without mention of fluctuations: Secondary | ICD-10-CM | POA: Diagnosis present

## 2022-03-24 DIAGNOSIS — Z7902 Long term (current) use of antithrombotics/antiplatelets: Secondary | ICD-10-CM | POA: Diagnosis not present

## 2022-03-24 DIAGNOSIS — Z993 Dependence on wheelchair: Secondary | ICD-10-CM | POA: Diagnosis not present

## 2022-03-24 DIAGNOSIS — Z79899 Other long term (current) drug therapy: Secondary | ICD-10-CM | POA: Diagnosis not present

## 2022-03-24 DIAGNOSIS — Z66 Do not resuscitate: Secondary | ICD-10-CM | POA: Diagnosis present

## 2022-03-24 DIAGNOSIS — I6389 Other cerebral infarction: Secondary | ICD-10-CM | POA: Diagnosis not present

## 2022-03-24 DIAGNOSIS — Z7189 Other specified counseling: Secondary | ICD-10-CM | POA: Diagnosis not present

## 2022-03-24 DIAGNOSIS — Z515 Encounter for palliative care: Secondary | ICD-10-CM | POA: Diagnosis not present

## 2022-03-24 LAB — COMPREHENSIVE METABOLIC PANEL
ALT: 19 U/L (ref 0–44)
AST: 28 U/L (ref 15–41)
Albumin: 3.3 g/dL — ABNORMAL LOW (ref 3.5–5.0)
Alkaline Phosphatase: 88 U/L (ref 38–126)
Anion gap: 7 (ref 5–15)
BUN: 12 mg/dL (ref 8–23)
CO2: 26 mmol/L (ref 22–32)
Calcium: 8.6 mg/dL — ABNORMAL LOW (ref 8.9–10.3)
Chloride: 106 mmol/L (ref 98–111)
Creatinine, Ser: 1.14 mg/dL (ref 0.61–1.24)
GFR, Estimated: >60 mL/min (ref >60)
Glucose, Bld: 143 mg/dL — ABNORMAL HIGH (ref 70–99)
Potassium: 3.7 mmol/L (ref 3.5–5.1)
Sodium: 139 mmol/L (ref 135–145)
Total Bilirubin: 0.8 mg/dL (ref 0.3–1.2)
Total Protein: 6.7 g/dL (ref 6.5–8.1)

## 2022-03-24 LAB — CBC
HCT: 40.5 % (ref 39.0–52.0)
Hemoglobin: 12.9 g/dL — ABNORMAL LOW (ref 13.0–17.0)
MCH: 30.2 pg (ref 26.0–34.0)
MCHC: 31.9 g/dL (ref 30.0–36.0)
MCV: 94.8 fL (ref 80.0–100.0)
Platelets: 152 10*3/uL (ref 150–400)
RBC: 4.27 MIL/uL (ref 4.22–5.81)
RDW: 13.9 % (ref 11.5–15.5)
WBC: 4.2 10*3/uL (ref 4.0–10.5)
nRBC: 0 % (ref 0.0–0.2)

## 2022-03-24 LAB — APTT: aPTT: 30 seconds (ref 24–36)

## 2022-03-24 LAB — DIFFERENTIAL
Abs Immature Granulocytes: 0.01 10*3/uL (ref 0.00–0.07)
Basophils Absolute: 0 10*3/uL (ref 0.0–0.1)
Basophils Relative: 1 %
Eosinophils Absolute: 0.2 10*3/uL (ref 0.0–0.5)
Eosinophils Relative: 6 %
Immature Granulocytes: 0 %
Lymphocytes Relative: 27 %
Lymphs Abs: 1.2 10*3/uL (ref 0.7–4.0)
Monocytes Absolute: 0.6 10*3/uL (ref 0.1–1.0)
Monocytes Relative: 13 %
Neutro Abs: 2.2 10*3/uL (ref 1.7–7.7)
Neutrophils Relative %: 53 %

## 2022-03-24 LAB — ETHANOL: Alcohol, Ethyl (B): <10 mg/dL (ref <10)

## 2022-03-24 LAB — GLUCOSE, CAPILLARY
Glucose-Capillary: 87 mg/dL (ref 70–99)
Glucose-Capillary: 98 mg/dL (ref 70–99)

## 2022-03-24 LAB — PROTIME-INR
INR: 1 (ref 0.8–1.2)
Prothrombin Time: 13.2 seconds (ref 11.4–15.2)

## 2022-03-24 LAB — MRSA NEXT GEN BY PCR, NASAL: MRSA by PCR Next Gen: DETECTED — AB

## 2022-03-24 MED ORDER — SODIUM CHLORIDE 0.9% FLUSH
3.0000 mL | Freq: Once | INTRAVENOUS | Status: AC
  Administered 2022-03-24: 3 mL via INTRAVENOUS

## 2022-03-24 MED ORDER — CHLORHEXIDINE GLUCONATE CLOTH 2 % EX PADS
6.0000 | MEDICATED_PAD | Freq: Every day | CUTANEOUS | Status: DC
  Administered 2022-03-24 – 2022-03-31 (×8): 6 via TOPICAL

## 2022-03-24 MED ORDER — LACTATED RINGERS IV BOLUS
500.0000 mL | Freq: Once | INTRAVENOUS | Status: AC
  Administered 2022-03-24: 500 mL via INTRAVENOUS

## 2022-03-24 MED ORDER — LABETALOL HCL 5 MG/ML IV SOLN: 10.0000 mg | INTRAVENOUS | Status: DC | PRN

## 2022-03-24 MED ORDER — DIPHENHYDRAMINE HCL 50 MG/ML IJ SOLN
INTRAMUSCULAR | Status: AC
  Filled 2022-03-24: qty 1

## 2022-03-24 MED ORDER — DIPHENHYDRAMINE HCL 50 MG/ML IJ SOLN: 25.0000 mg | Freq: Once | INTRAMUSCULAR | Status: DC

## 2022-03-24 MED ORDER — LACTATED RINGERS IV SOLN: INTRAVENOUS | Status: AC

## 2022-03-24 MED ORDER — ACETAMINOPHEN 325 MG PO TABS: 650.0000 mg | ORAL_TABLET | ORAL | Status: DC | PRN

## 2022-03-24 MED ORDER — ACETAMINOPHEN 160 MG/5ML PO SOLN: 650.0000 mg | ORAL | Status: DC | PRN

## 2022-03-24 MED ORDER — ACETAMINOPHEN 650 MG RE SUPP: 650.0000 mg | RECTAL | Status: DC | PRN

## 2022-03-24 MED ORDER — DIPHENHYDRAMINE HCL 50 MG/ML IJ SOLN
25.0000 mg | Freq: Once | INTRAMUSCULAR | Status: AC
  Administered 2022-03-24: 25 mg via INTRAVENOUS

## 2022-03-24 MED ORDER — SENNOSIDES-DOCUSATE SODIUM 8.6-50 MG PO TABS
1.0000 | ORAL_TABLET | Freq: Every evening | ORAL | Status: DC | PRN
  Administered 2022-04-01: 1 via ORAL
  Filled 2022-03-24: qty 1

## 2022-03-24 MED ORDER — TENECTEPLASE FOR STROKE
18.0000 mg | PACK | Freq: Once | INTRAVENOUS | Status: AC
  Administered 2022-03-24: 18 mg via INTRAVENOUS

## 2022-03-24 MED ORDER — IOHEXOL 350 MG/ML SOLN
100.0000 mL | Freq: Once | INTRAVENOUS | Status: AC | PRN
  Administered 2022-03-24: 100 mL via INTRAVENOUS

## 2022-03-24 MED ORDER — PANTOPRAZOLE SODIUM 40 MG IV SOLR
40.0000 mg | Freq: Every day | INTRAVENOUS | Status: DC
  Administered 2022-03-24 – 2022-03-25 (×2): 40 mg via INTRAVENOUS
  Filled 2022-03-24 (×2): qty 10

## 2022-03-24 MED ORDER — STROKE: EARLY STAGES OF RECOVERY BOOK: Freq: Once | Status: AC

## 2022-03-24 NOTE — Progress Notes (Signed)
Notified Dr Quinn Axe of pt being taken back to CT for change in mental status.

## 2022-03-24 NOTE — Progress Notes (Signed)
PHARMACIST CODE STROKE RESPONSE  Notified to mix TNK at 1756 by Dr. Su Monks TNK preparation completed at 1759  TNK dose = 18 mg IV over 5 seconds  Issues/delays encountered (if applicable): N/A  Kevin Bradshaw 03/24/22 6:17 PM

## 2022-03-24 NOTE — ED Notes (Signed)
EKG given to Cheri Fowler, MD

## 2022-03-24 NOTE — Consult Note (Addendum)
NEUROLOGY TELECONSULTATION NOTE   Date of service: March 24, 2022 Patient Name: Kevin Bradshaw MRN:  [id number] DOB:  [birth year] Reason for consult: telestroke  Requesting Provider: Dr. Valora Piccolo Consult Participants: myself, patient, bedside RN, telestroke RN Location of the provider: [place], [place] Location of the patient: [facility]  This consult was provided via telemedicine with 2-way video and audio communication. The patient/family was informed that care would be provided in this way and agreed to receive care in this manner.   _ _ _   _ __   _ __ _ _  __ __   _ __   __ _  History of Present Illness   This is a 87 yo man with hx a fib not on anticoagulation 2/2 frequent falls, HL, HTN, Parkinson disease, stroke 2019 who presents with acute onset severe dysarthria, L hemineglect, and L sensory deficit. NIHSS = 7. CT head showed acute R parietal infarct ASPECTS 8. Patient was mildly confused and had difficulty following simple commands and therefore was unable to provide clear history or consent.  LKW initially thought to be this AM at 0700 when daughter last saw him. She subsequently reported that her husband had seen him more recently but was unsure what time. I called his cell phone and got voicemail. At this point 2/2 unclear last known well he was not eligible for TNK. Son in law then arrived and was able to confirm that last known well was 1400 today. Based on that information he was a TNK candidate. Difficulty obtaining LKW resulted in delay in door to needle time. Risks/benefits and alternatives were discussed with his daughter who gave informed consent to proceed. TNK was administered at 1759. CT head was interpreted by me prior to TNK being given.  CTA resulted after TNK was administered and demonstrated a suboccipital hematoma in the dorsal soft tissue, personal review. No exam change at this time; D/w Dr. Leonie Man, no need to reverse TNK. Patient subsequently had R sided facial  flushing and received benadryl after which he became sleepy. Repeat head CT was performed for mental status change but showed only residual contrast from CTA H&N and no hemorrhage in the brain (d/w radiology by phone).  CTA H&N showed no LVO but did show severe L P2 stenosis  ROS   UTA 2/2 mental status  Past History   The following was personally reviewed:  Past Medical History:  Diagnosis Date   Diastolic dysfunction    a. 09/2018 Echo: EF 55-60%, mild LVH. Diast dysfxn. Nl RV fxn. Nl LA size.   Falls    Hyperlipidemia    Hypertension    PAF (paroxysmal atrial fibrillation) ([place])    a. 09/2018 in the setting of urosepsis; b. CHA2DS2VASc = 5-->No OAC due to high risk for falls (on ASA/Plavix).   Parkinson disease    Stroke ([place]) 08/2017   Urosepsis ([place]) 09/2018   Past Surgical History:  Procedure Laterality Date   CARDIOVASCULAR STRESS TEST     normal   CARPAL TUNNEL RELEASE  2009   right hand   X-STOP IMPLANTATION     X-STOP IMPLANTATION     Dr. Mauri Pole   Family History  Problem Relation Age of Onset   Heart disease Father 37   Cancer Sister        ? type   Heart disease Brother    Cancer Brother        ? type   Stroke Child    Seizures  Child    Social History   Socioeconomic History   Marital status: Widowed    Spouse name: Not on file   Number of children: 3   Years of education: Not on file   Highest education level: Not on file  Occupational History   Occupation: Retired    Fish farm manager: retired    Comment: service station employee  Tobacco Use   Smoking status: Former    Packs/day: 1.50    Years: 50.00    Total pack years: 75.00    Types: Cigarettes    Quit date: 11/04/2002    Years since quitting: 19.3   Smokeless tobacco: Never  Vaping Use   Vaping Use: Never used  Substance and Sexual Activity   Alcohol use: Yes    Comment: once a week   Drug use: No   Sexual activity: Not on file  Other Topics Concern   Not on file  Social History  Narrative   Not on file   Social Determinants of Health   Financial Resource Strain: Not on file  Food Insecurity: Not on file  Transportation Needs: Not on file  Physical Activity: Not on file  Stress: Not on file  Social Connections: Not on file   No Known Allergies  Medications   (Not in a hospital admission)     Current Facility-Administered Medications:    sodium chloride flush (NS) 0.9 % injection 3 mL, 3 mL, Intravenous, Once, Bradler, Vista Lawman, MD  Current Outpatient Medications:    acetaminophen (TYLENOL) 325 MG tablet, Take 2 tablets (650 mg total) by mouth every 6 (six) hours as needed for mild pain (or Fever >/= 101)., Disp: , Rfl:    aspirin 81 MG tablet, Take 81 mg by mouth daily.  , Disp: , Rfl:    atorvastatin (LIPITOR) 80 MG tablet, Take 1 tablet (80 mg total) by mouth daily at 6 PM., Disp: 30 tablet, Rfl: 1   clopidogrel (PLAVIX) 75 MG tablet, Take 1 tablet (75 mg total) by mouth daily., Disp: 30 tablet, Rfl: 1   metoprolol succinate (TOPROL XL) 50 MG 24 hr tablet, Take 1 tablet (50 mg total) by mouth daily. Take with or immediately following a meal., Disp: 30 tablet, Rfl: 11  Vitals   There were no vitals filed for this visit.   There is no height or weight on file to calculate BMI.  Physical Exam   Exam performed over telemedicine with 2-way video and audio communication and with assistance of bedside RN  Physical Exam Gen: A&Ox2, some difficulty following simple commands Resp: normal WOB CV: extremities appear well-perfused  Neuro: *MS: A&Ox2, some difficulty following simple commands *Speech: severe dysarthria, no aphasia *CN: PERRL 55m, EOMI, VFF by confrontation, sensation intact, smile symmetric, hearing intact to voice *Motor:   Normal bulk.  No tremor, rigidity or bradykinesia. No pronator drift. All extremities appear full-strength and symmetric. *Sensory: L sensory deficit, Extinction to DSS *Coordination:  FNF bilat with dysmetria but no  frank ataxia *Reflexes:  UTA 2/2 tele-exam *Gait: deferred  NIHSS  1a Level of Conscious.: 0 1b LOC Questions: 2 1c LOC Commands: 1 2 Best Gaze: 0 3 Visual: 0 4 Facial Palsy: 0 5a Motor Arm - left: 0 5b Motor Arm - Right: 0 6a Motor Leg - Left: 0 6b Motor Leg - Right: 0 7 Limb Ataxia: 0 8 Sensory: 1 9 Best Language: 0 10 Dysarthria: 2 11 Extinct. and Inatten.: 1  TOTAL: 7   Premorbid mRS =  1   Labs   CBC: No results for input(s): "WBC", "NEUTROABS", "HGB", "HCT", "MCV", "PLT" in the last 168 hours.  Basic Metabolic Panel:  Lab Results  Component Value Date   NA 139 10/07/2020   K 4.2 10/07/2020   CO2 21 (L) 10/07/2020   GLUCOSE 93 10/07/2020   BUN 10 10/07/2020   CREATININE 1.03 10/07/2020   CALCIUM 9.2 10/07/2020   GFRNONAA >60 10/07/2020   GFRAA >60 10/05/2018   Lipid Panel:  Lab Results  Component Value Date   LDLCALC 68 10/03/2018   HgbA1c:  Lab Results  Component Value Date   HGBA1C 5.8 (H) 10/03/2018   Urine Drug Screen: No results found for: "LABOPIA", "COCAINSCRNUR", "LABBENZ", "AMPHETMU", "THCU", "LABBARB"  Alcohol Level No results found for: "ETH"    Impression   This is a 87 yo man with hx a fib not on anticoagulation 2/2 frequent falls, HL, HTN, Parkinson disease, stroke 2019 who presents with acute onset severe dysarthria, L hemineglect, and L sensory deficit. NIHSS = 7. CT head showed acute R parietal infarct ASPECTS 8. Patient was mildly confused and had difficulty following simple commands and therefore was unable to provide clear history or consent. LKW 1400. Risks/benefits and alternatives were discussed with his daughter who gave informed consent to proceed. TNK was administered at 1759. CT head was interpreted by me prior to TNK being given.  CTA resulted after TNK was administered and demonstrated a suboccipital hematoma in the dorsal soft tissue. No exam change at this time; D/w Dr. Leonie Man, no need to reverse TNK. Patient  subsequently had R sided facial flushing and received benadryl after which he became sleepy. Repeat head CT was performed for mental status change but showed only residual contrast from CTA H&N and no hemorrhage in the brain (d/w radiology by phone).  Recommendations   - Admit to ICU - Neurochecks and NIHSS documentation per post-tNK protocol - STAT head CT for any change in neurologic exam - Non-con head CT 24 hrs post-tNK r/o hemorrhagic conversion - MRI brain wo contrast - TTE - no aspirin for 24 hours post IV t-NK and until ICH ruled out by a head CT - keep SBP less than 180/105 for the 1st 24 hours post IV t-NK to reduce the risk of hemorrhagic transformation - SCDs for DVT prophylaxis; can start SQ Heparin if head CT 24 hours post IV tNK is negative for ICH - NPO; swallow study - PT/OT and speech therapy - stroke education - outpatient f/u with neurology after discharge  Neurology will continue to follow.  This patient is critically ill and at significant risk of neurological worsening, death and care requires constant monitoring of vital signs, hemodynamics,respiratory and cardiac monitoring, neurological assessment, discussion with family, other specialists and medical decision making of high complexity. I spent 90 minutes of neurocritical care time  in the care of  this patient. This was time spent independent of any time provided by nurse practitioner or PA.  Su Monks, MD [facility] [phone]  If 7pm- 7am, please page neurology on call as listed in [place].

## 2022-03-24 NOTE — ED Notes (Signed)
Decision made by provider to give TNK. Daughter (HPOA) verbally consented.

## 2022-03-24 NOTE — ED Notes (Signed)
MD called to bedside r/t right sided facial flushing. MD VO for '25mg'$  IV benadryl.

## 2022-03-24 NOTE — ED Notes (Signed)
ED MD called to bedside for mental status change. Pt less responsive. Transported to CT scanner and remained monitored.

## 2022-03-24 NOTE — ED Triage Notes (Addendum)
Pt arrived to ED with [facility] from home with complaint of aphasia. Pt's last well known per family 1400 today. Code stroke activated. Pt taken to CT scanner. Daughter states that patient is confused at baseline  BG 148  BP with EMS 137/76

## 2022-03-24 NOTE — ED Provider Notes (Signed)
[facility] Provider Note   Event Date/Time   First MD Initiated Contact with Patient 03/24/22 1735     (approximate) History  Code Stroke  HPI Kevin Bradshaw is a 87 y.o. male with past medical history of hypertension, hyperlipidemia, and paroxysmal atrial fibrillation who presents via EMS with complaints of aphasia/dysarthria.  Patient's last known well time approximately 1400 today.  Code stroke activated prior to arrival and patient arrived in [place] where this interview was performed.  History obtained from family as patient is unable to fully articulate at this time.  Family states that patient was in his normal state of health until approximately 1400 today when a family member went outside to do some yard work and when he came back and noticed patient to be dysarthric.  Patient has reportedly had difficulty with word finding as well. ROS: Unable to obtain   Physical Exam  Triage Vital Signs: ED Triage Vitals  Enc Vitals Group     BP 03/24/22 1805 118/70     Pulse Rate 03/24/22 1725 92     Resp 03/24/22 1725 18     Temp 03/24/22 1725 98.7 F (37.1 C)     Temp Source 03/24/22 1725 Oral     SpO2 03/24/22 1805 98 %     Weight 03/24/22 1809 160 lb 1.6 oz (72.6 kg)     Height 03/24/22 1809 '5\' 2"'$  (1.575 m)     Head Circumference --      Peak Flow --      Pain Score 03/24/22 1722 0     Pain Loc --      Pain Edu? --      Excl. in [place]? --    Most recent vital signs: Vitals:   03/24/22 1805 03/24/22 1814  BP: 118/70 (!) 118/92  Pulse: (!) 101 100  Resp: 18 16  Temp:  98.7 F (37.1 C)  SpO2: 98% 97%   General: Awake, oriented x4. CV:  Good peripheral perfusion.  Resp:  Normal effort.  Abd:  No distention.  Other:  Elderly the overweight Caucasian male laying in bed in no acute distress.  Dysarthric.  NIHSS 7 ED Results / Procedures / Treatments  Labs (all labs ordered are listed, but only abnormal results are displayed) Labs Reviewed  CBC -  Abnormal; Notable for the following components:      Result Value   Hemoglobin 12.9 (*)    All other components within normal limits  COMPREHENSIVE METABOLIC PANEL - Abnormal; Notable for the following components:   Glucose, Bld 143 (*)    Calcium 8.6 (*)    Albumin 3.3 (*)    All other components within normal limits  APTT  DIFFERENTIAL  ETHANOL  PROTIME-INR  CBG MONITORING, ED   EKG ED ECG REPORT I, Naaman Plummer, the attending physician, personally viewed and interpreted this ECG. Date: 03/24/2022 EKG Time: 1811 Rate: 96 Rhythm: Atrial fibrillation QRS Axis: normal Intervals: normal ST/T Wave abnormalities: normal Narrative Interpretation: Atrial fibrillation.  No evidence of acute ischemia RADIOLOGY ED MD interpretation: CT without contrast of the head shows acute right MCA territory predominantly involving the parietal lobe infarct without hemorrhage  CT angiography of the head and neck with perfusion shows no large vessel occlusion with severe stenosis of the left PCA P1 segment with left posterior communicating artery contributing to the left P2 opacification.  There is less than 50% stenosis of the proximal right internal ICA secondary to mixed  plaque -Agree with radiology assessment Official radiology report(s): CT ANGIO HEAD NECK W WO CM W PERF (CODE STROKE)  Result Date: 03/24/2022 CLINICAL DATA:  Neuro deficit, acute, stroke suspected. Right parietal infarct on CT. EXAM: CT ANGIOGRAPHY HEAD AND NECK TECHNIQUE: Multidetector CT imaging of the head and neck was performed using the standard protocol during bolus administration of intravenous contrast. Multiplanar CT image reconstructions and MIPs were obtained to evaluate the vascular anatomy. Carotid stenosis measurements (when applicable) are obtained utilizing NASCET criteria, using the distal internal carotid diameter as the denominator. RADIATION DOSE REDUCTION: This exam was performed according to the departmental  dose-optimization program which includes automated exposure control, adjustment of the mA and/or kV according to patient size and/or use of iterative reconstruction technique. CONTRAST:  161m OMNIPAQUE IOHEXOL 350 MG/ML SOLN COMPARISON:  Head CT 03/24/2022. FINDINGS: CTA NECK FINDINGS Aortic arch: Three-vessel arch configuration. Atherosclerotic calcifications of the aortic arch and arch vessel origins. Arch vessel origins are patent. Right carotid system: Mixed plaque of the right carotid bulb with less than 50% stenosis of the proximal right cervical ICA. Left carotid system: Predominantly calcified plaque of the left carotid bulb without stenosis. Vertebral arteries: Patent from the origin to the confluence with the basilar without stenosis or dissection. Skeleton: Severe degenerative changes of the cervical spine with multilevel mild-to-moderate stenosis. Other neck: Hematoma in the suboccipital dorsal soft tissues. Upper chest: Unremarkable. Review of the MIP images confirms the above findings CTA HEAD FINDINGS Anterior circulation: Calcified plaque along the carotid siphons without hemodynamically significant stenosis. The proximal ACAs and MCAs are patent without stenosis or aneurysm. Distal branches are symmetric. Posterior circulation: Normal basilar artery. The SCAs, AICAs and PICAs are patent proximally. Severe stenosis of the left P1 segment with left posterior communicating artery contributing to the left P2 opacification. Distal branches are symmetric. Venous sinuses: As permitted by contrast timing, patent. Anatomic variants: None. Review of the MIP images confirms the above findings IMPRESSION: 1. No large vessel occlusion. 2. 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 3. Less than 50% stenosis of the proximal right cervical ICA secondary to mixed plaque. 4. Hematoma in the suboccipital dorsal soft tissues. 5. Aortic  Atherosclerosis (ICD10-I70.0). Electronically Signed   By: WEmmit AlexandersM.D.   On: 03/24/2022 18:17   CT HEAD CODE STROKE WO CONTRAST  Result Date: 03/24/2022 CLINICAL DATA:  Code stroke.  Left-sided deficits EXAM: CT HEAD WITHOUT CONTRAST TECHNIQUE: Contiguous axial images were obtained from the base of the skull through the vertex without intravenous contrast. RADIATION DOSE REDUCTION: This exam was performed according to the departmental dose-optimization program which includes automated exposure control, adjustment of the mA and/or kV according to patient size and/or use of iterative reconstruction technique. COMPARISON:  CT head 10/02/18 FINDINGS: Brain: Acute to subacute right parietal lobe infarct. No hemorrhage. Advanced generalized volume loss. Sequela of moderate chronic microvascular ischemic change. Chronic infarct in the left lentiform nucleus. Ventriculomegaly that is proportional to the degree of volume loss. No extra-axial fluid collection. Vascular: No hyperdense vessel or unexpected calcification. Skull: Normal. Negative for fracture or focal lesion. Sinuses/Orbits: No middle ear or mastoid effusion. Paranasal sinuses are clear. Orbits are unremarkable. Other: None. ASPECTS ([facility] Early CT Score) - Ganglionic level infarction (caudate, lentiform nuclei, internal capsule, insula, M1-M3 cortex): 6 - Supraganglionic infarction (M4-M6 cortex): 2 Total score (0-10 with 10 being normal): 8 IMPRESSION: 1. Acute right MCA territory predominantly  involving the parietal lobe infarct. No hemorrhage. 2. Aspects is 8. Findings were paged to Dr. Quinn Axe via [place] paging system on 03/24/22 at 5:36 PM. Electronically Signed   By: Marin Roberts M.D.   On: 03/24/2022 17:42   PROCEDURES: Critical Care performed: Yes, see critical care procedure note(s) Procedures CRITICAL CARE Performed by: Naaman Plummer  Total critical care time: 37 minutes  Critical care time was exclusive of  separately billable procedures and treating other patients.  Critical care was necessary to treat or prevent imminent or life-threatening deterioration.  Critical care was time spent personally by me on the following activities: development of treatment plan with patient and/or surrogate as well as nursing, discussions with consultants, evaluation of patient's response to treatment, examination of patient, obtaining history from patient or surrogate, ordering and performing treatments and interventions, ordering and review of laboratory studies, ordering and review of radiographic studies, pulse oximetry and re-evaluation of patient's condition.  MEDICATIONS ORDERED IN ED: Medications  sodium chloride flush (NS) 0.9 % injection 3 mL (has no administration in time range)  diphenhydrAMINE (BENADRYL) injection 25 mg (25 mg Intravenous Not Given 03/24/22 1820)  iohexol (OMNIPAQUE) 350 MG/ML injection 100 mL (100 mLs Intravenous Contrast Given 03/24/22 1751)  tenecteplase (TNKASE) injection for Stroke 18 mg (18 mg Intravenous Given 03/24/22 1759)  diphenhydrAMINE (BENADRYL) injection 25 mg (25 mg Intravenous Given 03/24/22 1817)   IMPRESSION / MDM / ASSESSMENT AND PLAN / ED COURSE  I reviewed the triage vital signs and the nursing notes.                             The patient is on the cardiac monitor to evaluate for evidence of arrhythmia and/or significant heart rate changes. Patient's presentation is most consistent with acute presentation with potential threat to life or bodily function. Patient an 87 year old male who presents for dysarthria and aphasia that began at approximately 1400 today PMH risk factors: Hyperlipidemia, atrial fibrillation, hypertension Neurologic Deficits: Dysarthria, aphasia, left neglect Last known Well Time:1400 NIH Stroke Score:7 Given History and Exam I have lower suspicion for infectious etiology, neurologic changes secondary to toxicologic ingestion, seizure,  complex migraine. Presentation concerning for possible stroke requiring workup.  Workup: Labs: POC glucose, CBC, BMP, LFTs, Troponin, PT/INR, PTT, Type and Screen Other Diagnostics: ECG, CXR, non-contrast head CT followed by CTA brain and neck (to r/o large vessel occlusion amenable to thrombectomy) Interventions: Patient administered TNKase after thorough review of risk factors as well as shared decision making with family  Consult: Neurology. Discussed with Dr. Quinn Axe regarding patient's neurological symptoms and last well-known time approximately 1400 and eligibility for TPA criteria. Disposition: Admission to Neurology ICU.   FINAL CLINICAL IMPRESSION(S) / ED DIAGNOSES   Final diagnoses:  Cerebrovascular accident (CVA), unspecified mechanism ([place])  Dysarthria   Rx / DC Orders   ED Discharge Orders     None      Note:  This document was prepared using Dragon voice recognition software and may include unintentional dictation errors.   Naaman Plummer, MD 03/24/22 [phone]

## 2022-03-24 NOTE — ED Notes (Signed)
Pt transferred to ICU room 18 at this time by this RN. Pt to be monitored during transport.

## 2022-03-24 NOTE — Progress Notes (Addendum)
Code stroke activated per elert @ [id number].  Pt to CT 1728.  Dr Quinn Axe notified @ 1728 and on camera to assess patient in CT @ 1730.  TNK given '18mg'$ /3.6 ml @ 1759 after review of sx, ruling out contraindications and obtaining consent from family.  Verified pt name and DOB, dosage and indication prior to admin.  Pt returned to ER @ 1802.    MRS 3

## 2022-03-24 NOTE — ED Notes (Signed)
CBG upon arrival 148

## 2022-03-24 NOTE — Progress Notes (Signed)
Notified Dr Quinn Axe right facial flushing and ERP ordering Benadryl for sx management.

## 2022-03-24 NOTE — H&P (Signed)
NAME:  Kevin Bradshaw, MRN:  [id number], DOB:  [birth year], LOS: 0 ADMISSION DATE:  03/24/2022, CONSULTATION DATE:  03/24/22 REFERRING MD:  Dr. Cheri Fowler, CHIEF COMPLAINT:  CODE STROKE   History of Present Illness:  87 yo M presenting to [facility] ED from home via EMS on 03/24/22 for evaluation of stroke like symptoms.  HPI obtained via chart review and confirmed with family bedside, patient unable to fully participate in interview at this time. Patient had been in his normal state of health during the day, his official LKW was around 1400 when his son-in-law saw him outside in his wheelchair getting the mail, eating and drinking a beer. Later on his son-in-law saw him lethargic in the kitchen, which is not unusual for the patient who can get fatigued throughout the day. He asked him if he wanted to go to bed, and the patient seemed to mumble no.  The son in law returned outside, then a little while later emergency services arrived at the house responding to a Life alert activation. At some point the patient had pushed his life alert button. When his son-in-law went into the house he noticed the patient developed acute severe dysarthria, L hemineglect, and L sensory deficit.   Family confirms baseline confusion, reporting he knows his name and his family members but is normally disoriented to time, situation, and place but can wax and wane. He has frequent falls, 2 weeks ago he fell 4 times. He is mostly wheelchair bound, but is able to walk some short distances. He can get confused with how to operate his WC. He feeds himself with difficulty due to a baseline tremor, but is independent in other ADL's such as dressing, bathing, toileting. Family denies any recent complaints or symptoms of illness. He missed his AM dose of his BB, but has otherwise been taking his medication as prescribed.  Of note the patient has a history of PAF in 2020 after an admission for urosepsis and was seen by cardiology. It was  determined that he was not a great candidate for systemic anticoagulation due to his frequent falls. At his last cardiology visit in 2020 the patient was noted to be in NSR and was already on plavix and ASA due to previous CVA.  ED course: Upon arrival patient mildly confused with difficulty following commands and unable to provide a clear history. CODE STROKE initiated, there was confusion regarding LKW and the patient was at first considered not a candidate for TNK. Initial NIHSS = 7. CT head showed acute R parietal infarct ASPECTS 8. After confirmation from son-in-law that LKW was 14:00, TNK was administered at 17:59. Post TNK CTa revealed a suboccipital hematoma in the dorsal soft tissue. No exam change noted by neurology at this time. Neurology did not recommend reversal of TNK. Patient subsequently had R sided facial flushing after TNK & IV contrast and received benadryl after which he became sleepy. Repeat head CT was performed for mental status change but showed only residual contrast from CTA H&N and no hemorrhage in the brain.  Medications given: TNK, benadryl 25 mg, IV contrast Initial Vitals: 98.7, 18, 92, 118/70 & 98 % on RA Significant labs: (Labs/ Imaging personally reviewed) I, Domingo Pulse Rust[place], AGACNP-BC, personally viewed and interpreted this ECG. EKG Interpretation: Date: 03/24/22, EKG Time: 18:11, Rate: 96, Rhythm: A-fib (challenging read due to artifact), QRS Axis:  normal, Intervals:  no PR, ST/T Wave abnormalities: none (challenging read due to artifact), Narrative Interpretation: controlled A-fib Chemistry: Na+:  139, K+: 3.7, BUN/Cr.: 12/1.14, Serum CO2/ AG: 26/7 Hematology: WBC: 4.2, Hgb: 12.9,   CTH wo contrast 03/24/22:  Acute right MCA territory predominantly involving the parietal lobe infarct. No hemorrhage. Aspects is 8. CT angio head & neck 03/24/22:  No large vessel occlusion. Severe stenosis of the left PCA P1 segment with left posterior communicating artery  contributing to the left P2 opacification. Less than 50% stenosis of the proximal right cervical ICA secondary to mixed plaque. Hematoma in the suboccipital dorsal soft tissues.  PCCM consulted for admission due to Acute CVA s/p TNK administration.  Pertinent  Medical History  Falls PAF not on anticoagulation Parkinson disease Stroke (2019) HTN HLD HFpEF (09/2018: LVEF 55-60%) Former smoker (quit 2005, 30 pack year history) Seizure disorder (not on AED, no seizures in years per recent PCP visit)  Significant Hospital Events: Including procedures, antibiotic start and stop dates in addition to other pertinent events   03/24/22: Admit to ICU with Acute Ischemic stroke s/p TNK administration.  Interim History / Subjective:  Patient drowsy but responsive, NIHSS completed below. Met with daughter and son as well as son-in-law and grandson. Plan of care discussed and all questions answered at this time.  Objective   Blood pressure (!) 126/98, pulse 98, temperature 98.3 F (36.8 C), resp. rate 20, height '5\' 2"'$  (1.575 m), weight 72.6 kg, SpO2 96 %.       No intake or output data in the 24 hours ending 03/24/22 1928 Filed Weights   03/24/22 1809  Weight: 72.6 kg    Examination: General: Adult male, acutely ill, lying in bed, fidgety but NAD HEENT: MM pink/moist, anicteric, atraumatic, neck supple Neuro: drowsy, A&O x 1 (baseline), able to follow persistent simple commands, PERRL +3 , MAE CN: II - Visual field challenging to assess due to difficulty following all commands and sleepiness post benadryl III, IV, VI - Extraocular movements intact. V - Facial sensation intact bilaterally. VII - Facial movement intact bilaterally VIII - Hearing & vestibular intact bilaterally X - Palate elevates symmetrically XI - Chin turning & shoulder shrug intact bilaterally. XII - Tongue protrusion intact Motor Strength - strength 5/5 in RUE, 3/5 in LUE, 4/5 BLE (with dorsi noticeably weaker in Left  foot. drift suspected in LLE.  Sensory - Light touch was assessed and was symmetrical Coordination - The patient had normal movements in the hands and feet with no ataxia or dysmetria.  Tremor was absent Gait and Station - deferred.  NIHSS Level of  Consciousness: 0 = Alert, keenly responsive; 1 = Not alert, but arousable by minor stimulation to obey, answer, or respond; 2 = Not alert, requires repeated stimulation to attend, or is obtunded and requires strong or painful stimulation to make movements (not stereotyped); 3 = Responds only with reflex motor or autonomic effects or totally unresponsive, flaccid, and areflexic   1  LOC Questions 0 = Answers both questions correctly; 1 = Answers one question correctly; 2 = Answers neither question correctly.  1  LOC Commands 0 = Performs both tasks correctly; 1 = Performs one task correctly; 2 = Performs neither task correctly  0  Best Gaze  0 = Normal; 1 = Partial gaze palsy; gaze is abnormal in one or both eyes, but forced deviation or total gaze paresis is not present; 2 = Forced deviation, or total gaze paresis not overcome by the oculocephalic maneuver   0  Visual 0 = No visual loss; 1 = Partial hemianopia; 2 = Complete hemianopia;  3 = Bilateral hemianopia (blind including cortical blindness).   0  Facial Palsy 0 = Normal symmetrical movements; 1 = Minor paralysis (flattened nasolabial fold, asymmetry on smiling); 2 = Partial paralysis (total or near-total paralysis of lower face); 3 = Complete paralysis of one or both sides (absence of facial movement in the upper and lower face).  0  Motor Arm LEFT 0 = No drift; limb holds 90 (or 45) degrees for full 10 secs; 1 = Drift; limb holds 90 (or 45) degrees, but drifts down before full 10 seconds; does not hit bed or other support; 2 = Some effort against gravity; limb cannot get to or maintain (if cued) 90 (or 45) degrees, drifts down to bed, but has some effort against gravity; 3 = No effort against gravity;  limb falls; 4 = No movement; UN = unable to test (Amputation or joint fusion).   0  Motor Arm RIGHT 0 = No drift; limb holds 90 (or 45) degrees for full 10 secs; 1 = Drift; limb holds 90 (or 45) degrees, but drifts down before full 10 seconds; does not hit bed or other support; 2 = Some effort against gravity; limb cannot get to or maintain (if cued) 90 (or 45) degrees, drifts down to bed, but has some effort against gravity; 3 = No effort against gravity; limb falls; 4 = No movement; UN = unable to test (Amputation or joint fusion).   0  Motor Leg LEFT 0 = No drift; leg holds 30 degree position for full 5 secs; 1 = Drift; leg falls by the end of the 5-sec period but does not hit bed; 2 = Some effort against gravity; leg falls to bed by 5 secs, but has some effort against gravity; 3 = No effort against gravity; leg falls to bed immediately; 4 = No movement; UN = unable to test (Amputation or joint fusion).   1  Motor Leg RIGHT 0 = No drift; leg holds 30 degree position for full 5 secs; 1 = Drift; leg falls by the end of the 5-sec period but does not hit bed; 2 = Some effort against gravity; leg falls to bed by 5 secs, but has some effort against gravity; 3 = No effort against gravity; leg falls to bed immediately; 4 = No movement; UN = unable to test (Amputation or joint fusion).   0  Limb Ataxia 0 = Absent; 1 = Present in one limb; 2 = Present in two limbs; UN = Amputation or joint fusion   2  Sensory  0 = Normal, no sensory loss; 1 = Mild-to-moderate sensory loss, patient feels pinprick is less sharp or is dull on the affected side, or there is a loss of superficial pain with pinprick, but patient is aware of being touched; 2 = Severe to total sensory loss, patient is not aware of being touched in the face, arm, and leg.   0  Best Language 0 = No aphasia; normal; 1 = Mild-to-moderate aphasia; some obvious loss of fluency or facility of comprehension, w/o significant limitation on ideas expressed or form of  expression. Reduction of speech and/or comprehension, however, makes conversation about provided materials difficult or impossible. For example, in conversation about provided materials, examiner can identify picture or naming card content from patient's response; 2 = Severe aphasia; all communication is through fragmentary expression; great need for inference, questioning, and guessing by the listener. Range of information that can be exchanged is limited; listener carries burden of communication.  Examiner cannot identify materials provided from patient response; 3 = Mute, global aphasia; no usable speech or auditory comprehension  1  Dysarthria 0 = Normal; 1 = Mild-to-moderate dysarthria, patient slurs at least some words and, at worst, can be understood with some difficulty; 2 = Severe dysarthria, patient's speech is so slurred as to be unintelligible in the absence of or out of proportion to any dysphasia, or is mute/anarthric; UN = Intubated or other physical barrier  1  Extinction/Inattention 0 = No abnormality 1 = Visual, tactile, auditory, spatial, or personal extinction/inattention to bilateral simultaneous stimulation in one of the sensory modalities. 2 = Profound hemi-inattention or extinction to more than one modality; does not recognize own hand or orients to only one side of space.  0  Total   7  CV: s1s2 irregular, controlled A-fib on monitor, no r/m/g Pulm: Regular, non labored on RA, breath sounds clear/diminished-BUL & diminished-BLL GI: soft, rounded, non tender, bs x 4 GU: external catheter in place Skin: no rashes/lesions noted Extremities: warm/dry, pulses + 2 R/P, +3 edema noted BLE  Resolved Hospital Problem list     Assessment & Plan:  Stroke Right parietal infarct embolic suspect secondary to atrial fibrillation Suboccipital hematoma in the dorsal soft tissue PMHx: HTN, HLD, CVA On aspirin 81 mg daily and clopidogrel 75 mg daily prior to admission TNK administered 17:59   - Neuro checks & NIHSS Q 15 M x 2 h, q 30 m x 6 h, q 1 h x 16 h - perform bedside swallow > SLP eval if needed - STAT CTH for any mentation change - PT/OT consult - Neurology consulted, appreciate input -  f/u MRI - Echocardiogram ordered - f/u Lipid panel & Hgb A1c  - SCD's for VTE prophylaxis - Q 4 CBG monitoring - follow ICU hypo/hyper-glycemia protocol - Labetalol PRN to maintain BP < 180/105 - LDL goal < 70 - hold outpatient regimen until patient clears swallow eval, then consider restarting Lipitor  Controlled Paroxysmal Atrial Fibrillation  CHA2DS2-VASc score: 5 points - f/u TSH & thyroid panel - consider continuing metoprolol once he clears swallow eval, as hemodynamics allow - Echocardiogram ordered - Cardiology consulted, appreciate input - Continuous cardiac monitoring  Recurrent Falls Parkinson's Confusion - fall precaution - safety monitoring PRN - TOC consultation for discharge planning  Best Practice (right click and "Reselect all SmartList Selections" daily)  Diet/type: NPO DVT prophylaxis: SCD GI prophylaxis: PPI Lines: N/A Foley:  N/A Code Status:  full code Last date of multidisciplinary goals of care discussion [03/24/22]  Labs   CBC: Recent Labs  Lab 03/24/22 1229  WBC 4.2  NEUTROABS 2.2  HGB 12.9*  HCT 40.5  MCV 94.8  PLT 0000000    Basic Metabolic Panel: Recent Labs  Lab 03/24/22 1229  NA 139  K 3.7  CL 106  CO2 26  GLUCOSE 143*  BUN 12  CREATININE 1.14  CALCIUM 8.6*   GFR: Estimated Creatinine Clearance: 42.2 mL/min (by C-G formula based on SCr of 1.14 mg/dL). Recent Labs  Lab 03/24/22 1229  WBC 4.2    Liver Function Tests: Recent Labs  Lab 03/24/22 1229  AST 28  ALT 19  ALKPHOS 88  BILITOT 0.8  PROT 6.7  ALBUMIN 3.3*   No results for input(s): "LIPASE", "AMYLASE" in the last 168 hours. No results for input(s): "AMMONIA" in the last 168 hours.  ABG No results found for: "PHART", "PCO2ART", "PO2ART", "HCO3",  "TCO2", "ACIDBASEDEF", "O2SAT"   Coagulation Profile: Recent  Labs  Lab 03/24/22 1229  INR 1.0    Cardiac Enzymes: No results for input(s): "CKTOTAL", "CKMB", "CKMBINDEX", "TROPONINI" in the last 168 hours.  HbA1C: Hgb A1c MFr Bld  Date/Time Value Ref Range Status  10/03/2018 01:38 PM 5.8 (H) 4.8 - 5.6 % Final    Comment:    (NOTE)         Prediabetes: 5.7 - 6.4         Diabetes: >6.4         Glycemic control for adults with diabetes: <7.0     CBG: No results for input(s): "GLUCAP" in the last 168 hours.  Review of Systems:   Challenging interview as patient drowsy, mildly aphasic/dysarthric and confused. No complaints of pain.  Past Medical History:  He,  has a past medical history of Diastolic dysfunction, Falls, Hyperlipidemia, Hypertension, PAF (paroxysmal atrial fibrillation) ([place]), Parkinson disease, Stroke ([place]) (08/2017), and Urosepsis ([place]) (09/2018).   Surgical History:   Past Surgical History:  Procedure Laterality Date   CARDIOVASCULAR STRESS TEST     normal   CARPAL TUNNEL RELEASE  2009   right hand   X-STOP IMPLANTATION     X-STOP IMPLANTATION     Dr. Mauri Pole     Social History:   reports that he quit smoking about 19 years ago. His smoking use included cigarettes. He has a 75.00 pack-year smoking history. He has never used smokeless tobacco. He reports current alcohol use. He reports that he does not use drugs.   Family History:  His family history includes Cancer in his brother and sister; Heart disease in his brother; Heart disease (age of onset: 32) in his father; Seizures in his child; Stroke in his child.   Allergies No Known Allergies   Home Medications  Prior to Admission medications   Medication Sig Start Date End Date Taking? Authorizing Provider  potassium chloride SA (KLOR-CON M) 20 MEQ tablet Take 20 mEq by mouth daily. 02/17/21  Yes [provider]  tamsulosin (FLOMAX) 0.4 MG CAPS capsule Take 0.4 mg by mouth daily. 12/26/20   Yes [provider]  acetaminophen (TYLENOL) 325 MG tablet Take 2 tablets (650 mg total) by mouth every 6 (six) hours as needed for mild pain (or Fever >/= 101). 02/21/18   Nicholes Mango, MD  aspirin 81 MG tablet Take 81 mg by mouth daily.      [provider]  atorvastatin (LIPITOR) 80 MG tablet Take 1 tablet (80 mg total) by mouth daily at 6 PM. 08/23/17   Demetrios Loll, MD  clopidogrel (PLAVIX) 75 MG tablet Take 1 tablet (75 mg total) by mouth daily. 08/23/17   Demetrios Loll, MD  finasteride (PROSCAR) 5 MG tablet Take 5 mg by mouth daily.    [provider]  metoprolol succinate (TOPROL XL) 50 MG 24 hr tablet Take 1 tablet (50 mg total) by mouth daily. Take with or immediately following a meal. 10/05/18 10/05/19  Gladstone Lighter, MD  Calcium Carbonate (CALCIUM 500 PO) Take 2 tablets by mouth daily.    01/06/16  [provider]     Critical care time: 67 minutes       Venetia Night, AGACNP-BC Acute Care Nurse Practitioner [facility]   [phone] / [phone] Please see Amion for pager details.

## 2022-03-25 ENCOUNTER — Encounter: Payer: Self-pay | Admitting: Pulmonary Disease

## 2022-03-25 ENCOUNTER — Inpatient Hospital Stay: Payer: Medicare HMO

## 2022-03-25 ENCOUNTER — Inpatient Hospital Stay (HOSPITAL_COMMUNITY)
Admit: 2022-03-25 | Discharge: 2022-03-25 | Disposition: A | Discharge status: Home / Self Care | Payer: Medicare HMO | Attending: Pulmonary Disease | Admitting: Pulmonary Disease

## 2022-03-25 DIAGNOSIS — R414 Neurologic neglect syndrome: Secondary | ICD-10-CM | POA: Diagnosis not present

## 2022-03-25 DIAGNOSIS — I6310 Cerebral infarction due to embolism of unspecified precerebral artery: Secondary | ICD-10-CM

## 2022-03-25 DIAGNOSIS — I480 Paroxysmal atrial fibrillation: Secondary | ICD-10-CM

## 2022-03-25 DIAGNOSIS — I4819 Other persistent atrial fibrillation: Secondary | ICD-10-CM

## 2022-03-25 DIAGNOSIS — Z7189 Other specified counseling: Secondary | ICD-10-CM

## 2022-03-25 DIAGNOSIS — I6389 Other cerebral infarction: Secondary | ICD-10-CM | POA: Diagnosis not present

## 2022-03-25 DIAGNOSIS — R471 Dysarthria and anarthria: Secondary | ICD-10-CM

## 2022-03-25 DIAGNOSIS — Z9181 History of falling: Secondary | ICD-10-CM

## 2022-03-25 DIAGNOSIS — Z515 Encounter for palliative care: Secondary | ICD-10-CM | POA: Diagnosis not present

## 2022-03-25 DIAGNOSIS — G20A1 Parkinson's disease without dyskinesia, without mention of fluctuations: Secondary | ICD-10-CM

## 2022-03-25 DIAGNOSIS — I639 Cerebral infarction, unspecified: Secondary | ICD-10-CM | POA: Diagnosis not present

## 2022-03-25 DIAGNOSIS — R531 Weakness: Secondary | ICD-10-CM

## 2022-03-25 LAB — ECHOCARDIOGRAM COMPLETE
AR max vel: 1.95 cm2
AV Area VTI: 2.16 cm2
AV Area mean vel: 2.08 cm2
AV Mean grad: 4 mmHg
AV Peak grad: 6.9 mmHg
Ao pk vel: 1.31 m/s
Area-P 1/2: 6.54 cm2
Height: 62 in
S' Lateral: 2 cm
Weight: 2465.62 oz

## 2022-03-25 LAB — BASIC METABOLIC PANEL
Anion gap: 8 (ref 5–15)
BUN: 10 mg/dL (ref 8–23)
CO2: 27 mmol/L (ref 22–32)
Calcium: 9 mg/dL (ref 8.9–10.3)
Chloride: 107 mmol/L (ref 98–111)
Creatinine, Ser: 0.99 mg/dL (ref 0.61–1.24)
GFR, Estimated: >60 mL/min (ref >60)
Glucose, Bld: 86 mg/dL (ref 70–99)
Potassium: 3.8 mmol/L (ref 3.5–5.1)
Sodium: 142 mmol/L (ref 135–145)

## 2022-03-25 LAB — CBC
HCT: 45 % (ref 39.0–52.0)
Hemoglobin: 14.3 g/dL (ref 13.0–17.0)
MCH: 29.8 pg (ref 26.0–34.0)
MCHC: 31.8 g/dL (ref 30.0–36.0)
MCV: 93.8 fL (ref 80.0–100.0)
Platelets: 170 10*3/uL (ref 150–400)
RBC: 4.8 MIL/uL (ref 4.22–5.81)
RDW: 14 % (ref 11.5–15.5)
WBC: 6.5 10*3/uL (ref 4.0–10.5)
nRBC: 0 % (ref 0.0–0.2)

## 2022-03-25 LAB — MAGNESIUM: Magnesium: 2.1 mg/dL (ref 1.7–2.4)

## 2022-03-25 LAB — GLUCOSE, CAPILLARY
Glucose-Capillary: 148 mg/dL — ABNORMAL HIGH (ref 70–99)
Glucose-Capillary: 98 mg/dL (ref 70–99)
Glucose-Capillary: 89 mg/dL (ref 70–99)
Glucose-Capillary: 103 mg/dL — ABNORMAL HIGH (ref 70–99)
Glucose-Capillary: 120 mg/dL — ABNORMAL HIGH (ref 70–99)
Glucose-Capillary: 141 mg/dL — ABNORMAL HIGH (ref 70–99)

## 2022-03-25 LAB — LIPID PANEL
Cholesterol: 160 mg/dL (ref 0–200)
HDL: 68 mg/dL (ref >40)
LDL Cholesterol: 77 mg/dL (ref 0–99)
Total CHOL/HDL Ratio: 2.4 RATIO
Triglycerides: 76 mg/dL (ref <150)
VLDL: 15 mg/dL (ref 0–40)

## 2022-03-25 LAB — TSH: TSH: 2.417 u[IU]/mL (ref 0.350–4.500)

## 2022-03-25 LAB — PHOSPHORUS: Phosphorus: 3.2 mg/dL (ref 2.5–4.6)

## 2022-03-25 LAB — T4, FREE: Free T4: 0.97 ng/dL (ref 0.61–1.12)

## 2022-03-25 MED ORDER — ADULT MULTIVITAMIN W/MINERALS CH
1.0000 | ORAL_TABLET | Freq: Every day | ORAL | Status: DC
  Administered 2022-03-26 – 2022-04-01 (×7): 1 via ORAL
  Filled 2022-03-25 (×7): qty 1

## 2022-03-25 MED ORDER — MUPIROCIN 2 % EX OINT
1.0000 | TOPICAL_OINTMENT | Freq: Two times a day (BID) | CUTANEOUS | Status: AC
  Administered 2022-03-25 – 2022-03-29 (×10): 1 via NASAL
  Filled 2022-03-25: qty 22

## 2022-03-25 MED ORDER — METOPROLOL SUCCINATE ER 25 MG PO TB24
25.0000 mg | ORAL_TABLET | Freq: Every day | ORAL | Status: DC
  Administered 2022-03-25 – 2022-04-01 (×7): 25 mg via ORAL
  Filled 2022-03-25 (×8): qty 1

## 2022-03-25 MED ORDER — ENSURE ENLIVE PO LIQD
237.0000 mL | Freq: Three times a day (TID) | ORAL | Status: DC
  Administered 2022-03-25 – 2022-04-01 (×15): 237 mL via ORAL

## 2022-03-25 NOTE — Progress Notes (Signed)
NAME:  Kevin Bradshaw, MRN:  [id number], DOB:  [birth year], LOS: 1 ADMISSION DATE:  03/24/2022, CONSULTATION DATE:  03/24/22 REFERRING MD:  Dr. Cheri Fowler, CHIEF COMPLAINT:  CODE STROKE   History of Present Illness:  87 yo M presenting to [facility] ED from home via EMS on 03/24/22 for evaluation of stroke like symptoms.  HPI obtained via chart review and confirmed with family bedside, patient unable to fully participate in interview at this time. Patient had been in his normal state of health during the day, his official LKW was around 1400 when his son-in-law saw him outside in his wheelchair getting the mail, eating and drinking a beer. Later on his son-in-law saw him lethargic in the kitchen, which is not unusual for the patient who can get fatigued throughout the day. He asked him if he wanted to go to bed, and the patient seemed to mumble no.  The son in law returned outside, then a little while later emergency services arrived at the house responding to a Life alert activation. At some point the patient had pushed his life alert button. When his son-in-law went into the house he noticed the patient developed acute severe dysarthria, L hemineglect, and L sensory deficit.   Family confirms baseline confusion, reporting he knows his name and his family members but is normally disoriented to time, situation, and place but can wax and wane. He has frequent falls, 2 weeks ago he fell 4 times. He is mostly wheelchair bound, but is able to walk some short distances. He can get confused with how to operate his WC. He feeds himself with difficulty due to a baseline tremor, but is independent in other ADL's such as dressing, bathing, toileting. Family denies any recent complaints or symptoms of illness. He missed his AM dose of his BB, but has otherwise been taking his medication as prescribed.  Of note the patient has a history of PAF in 2020 after an admission for urosepsis and was seen by cardiology. It was  determined that he was not a great candidate for systemic anticoagulation due to his frequent falls. At his last cardiology visit in 2020 the patient was noted to be in NSR and was already on plavix and ASA due to previous CVA.  ED course: Upon arrival patient mildly confused with difficulty following commands and unable to provide a clear history. CODE STROKE initiated, there was confusion regarding LKW and the patient was at first considered not a candidate for TNK. Initial NIHSS = 7. CT head showed acute R parietal infarct ASPECTS 8. After confirmation from son-in-law that LKW was 14:00, TNK was administered at 17:59. Post TNK CTa revealed a suboccipital hematoma in the dorsal soft tissue. No exam change noted by neurology at this time. Neurology did not recommend reversal of TNK. Patient subsequently had R sided facial flushing after TNK & IV contrast and received benadryl after which he became sleepy. Repeat head CT was performed for mental status change but showed only residual contrast from CTA H&N and no hemorrhage in the brain.  Medications given: TNK, benadryl 25 mg, IV contrast Initial Vitals: 98.7, 18, 92, 118/70 & 98 % on RA Significant labs: (Labs/ Imaging personally reviewed) I, Domingo Pulse Rust[place]ster, AGACNP-BC, personally viewed and interpreted this ECG. EKG Interpretation: Date: 03/24/22, EKG Time: 18:11, Rate: 96, Rhythm: A-fib (challenging read due to artifact), QRS Axis:  normal, Intervals:  no PR, ST/T Wave abnormalities: none (challenging read due to artifact), Narrative Interpretation: controlled A-fib Chemistry: Na+:  139, K+: 3.7, BUN/Cr.: 12/1.14, Serum CO2/ AG: 26/7 Hematology: WBC: 4.2, Hgb: 12.9,   CTH wo contrast 03/24/22:  Acute right MCA territory predominantly involving the parietal lobe infarct. No hemorrhage. Aspects is 8. CT angio head & neck 03/24/22:  No large vessel occlusion. Severe stenosis of the left PCA P1 segment with left posterior communicating artery  contributing to the left P2 opacification. Less than 50% stenosis of the proximal right cervical ICA secondary to mixed plaque. Hematoma in the suboccipital dorsal soft tissues.  PCCM consulted for admission due to Acute CVA s/p TNK administration.  Pertinent  Medical History  Falls PAF not on anticoagulation Parkinson disease Stroke (2019) HTN HLD HFpEF (09/2018: LVEF 55-60%) Former smoker (quit 2005, 30 pack year history) Seizure disorder (not on AED, no seizures in years per recent PCP visit)  Significant Hospital Events: Including procedures, antibiotic start and stop dates in addition to other pertinent events   03/24/22: Admit to ICU with Acute Ischemic stroke s/p TNK administration. 03/25/22- patient is now DNR I had family meeting with them and goals of care discussion  Interim History / Subjective:  Patient drowsy but responsive, NIHSS completed below. Met with daughter and son as well as son-in-law and grandson. Plan of care discussed and all questions answered at this time.  Objective   Blood pressure (!) 134/96, pulse (!) 50, temperature 98.1 F (36.7 C), temperature source Axillary, resp. rate 14, height '5\' 2"'$  (1.575 m), weight 69.9 kg, SpO2 96 %.        Intake/Output Summary (Last 24 hours) at 03/25/2022 0832 Last data filed at 03/25/2022 0600 Gross per 24 hour  Intake 922.81 ml  Output 1100 ml  Net -177.19 ml   Filed Weights   03/24/22 1809 03/24/22 2013  Weight: 72.6 kg 69.9 kg    Examination: General: Adult male, acutely ill, lying in bed, fidgety but NAD HEENT: MM pink/moist, anicteric, atraumatic, neck supple Neuro: drowsy, A&O x 1 (baseline), able to follow persistent simple commands, PERRL +3 , MAE CN: II - Visual field challenging to assess due to difficulty following all commands and sleepiness post benadryl III, IV, VI - Extraocular movements intact. V - Facial sensation intact bilaterally. VII - Facial movement intact bilaterally VIII - Hearing &  vestibular intact bilaterally X - Palate elevates symmetrically XI - Chin turning & shoulder shrug intact bilaterally. XII - Tongue protrusion intact Motor Strength - strength 5/5 in RUE, 3/5 in LUE, 4/5 BLE (with dorsi noticeably weaker in Left foot. drift suspected in LLE.  Sensory - Light touch was assessed and was symmetrical Coordination - The patient had normal movements in the hands and feet with no ataxia or dysmetria.  Tremor was absent Gait and Station - deferred.  NIHSS Level of  Consciousness: 0 = Alert, keenly responsive; 1 = Not alert, but arousable by minor stimulation to obey, answer, or respond; 2 = Not alert, requires repeated stimulation to attend, or is obtunded and requires strong or painful stimulation to make movements (not stereotyped); 3 = Responds only with reflex motor or autonomic effects or totally unresponsive, flaccid, and areflexic   1  LOC Questions 0 = Answers both questions correctly; 1 = Answers one question correctly; 2 = Answers neither question correctly.  1  LOC Commands 0 = Performs both tasks correctly; 1 = Performs one task correctly; 2 = Performs neither task correctly  0  Best Gaze  0 = Normal; 1 = Partial gaze palsy; gaze is abnormal in one  or both eyes, but forced deviation or total gaze paresis is not present; 2 = Forced deviation, or total gaze paresis not overcome by the oculocephalic maneuver   0  Visual 0 = No visual loss; 1 = Partial hemianopia; 2 = Complete hemianopia; 3 = Bilateral hemianopia (blind including cortical blindness).   0  Facial Palsy 0 = Normal symmetrical movements; 1 = Minor paralysis (flattened nasolabial fold, asymmetry on smiling); 2 = Partial paralysis (total or near-total paralysis of lower face); 3 = Complete paralysis of one or both sides (absence of facial movement in the upper and lower face).  0  Motor Arm LEFT 0 = No drift; limb holds 90 (or 45) degrees for full 10 secs; 1 = Drift; limb holds 90 (or 45) degrees, but  drifts down before full 10 seconds; does not hit bed or other support; 2 = Some effort against gravity; limb cannot get to or maintain (if cued) 90 (or 45) degrees, drifts down to bed, but has some effort against gravity; 3 = No effort against gravity; limb falls; 4 = No movement; UN = unable to test (Amputation or joint fusion).   0  Motor Arm RIGHT 0 = No drift; limb holds 90 (or 45) degrees for full 10 secs; 1 = Drift; limb holds 90 (or 45) degrees, but drifts down before full 10 seconds; does not hit bed or other support; 2 = Some effort against gravity; limb cannot get to or maintain (if cued) 90 (or 45) degrees, drifts down to bed, but has some effort against gravity; 3 = No effort against gravity; limb falls; 4 = No movement; UN = unable to test (Amputation or joint fusion).   0  Motor Leg LEFT 0 = No drift; leg holds 30 degree position for full 5 secs; 1 = Drift; leg falls by the end of the 5-sec period but does not hit bed; 2 = Some effort against gravity; leg falls to bed by 5 secs, but has some effort against gravity; 3 = No effort against gravity; leg falls to bed immediately; 4 = No movement; UN = unable to test (Amputation or joint fusion).   1  Motor Leg RIGHT 0 = No drift; leg holds 30 degree position for full 5 secs; 1 = Drift; leg falls by the end of the 5-sec period but does not hit bed; 2 = Some effort against gravity; leg falls to bed by 5 secs, but has some effort against gravity; 3 = No effort against gravity; leg falls to bed immediately; 4 = No movement; UN = unable to test (Amputation or joint fusion).   0  Limb Ataxia 0 = Absent; 1 = Present in one limb; 2 = Present in two limbs; UN = Amputation or joint fusion   2  Sensory  0 = Normal, no sensory loss; 1 = Mild-to-moderate sensory loss, patient feels pinprick is less sharp or is dull on the affected side, or there is a loss of superficial pain with pinprick, but patient is aware of being touched; 2 = Severe to total sensory loss,  patient is not aware of being touched in the face, arm, and leg.   0  Best Language 0 = No aphasia; normal; 1 = Mild-to-moderate aphasia; some obvious loss of fluency or facility of comprehension, w/o significant limitation on ideas expressed or form of expression. Reduction of speech and/or comprehension, however, makes conversation about provided materials difficult or impossible. For example, in conversation about provided materials,  examiner can identify picture or naming card content from patient's response; 2 = Severe aphasia; all communication is through fragmentary expression; great need for inference, questioning, and guessing by the listener. Range of information that can be exchanged is limited; listener carries burden of communication. Examiner cannot identify materials provided from patient response; 3 = Mute, global aphasia; no usable speech or auditory comprehension  1  Dysarthria 0 = Normal; 1 = Mild-to-moderate dysarthria, patient slurs at least some words and, at worst, can be understood with some difficulty; 2 = Severe dysarthria, patient's speech is so slurred as to be unintelligible in the absence of or out of proportion to any dysphasia, or is mute/anarthric; UN = Intubated or other physical barrier  1  Extinction/Inattention 0 = No abnormality 1 = Visual, tactile, auditory, spatial, or personal extinction/inattention to bilateral simultaneous stimulation in one of the sensory modalities. 2 = Profound hemi-inattention or extinction to more than one modality; does not recognize own hand or orients to only one side of space.  0  Total   7  CV: s1s2 irregular, controlled A-fib on monitor, no r/m/g Pulm: Regular, non labored on RA, breath sounds clear/diminished-BUL & diminished-BLL GI: soft, rounded, non tender, bs x 4 GU: external catheter in place Skin: no rashes/lesions noted Extremities: warm/dry, pulses + 2 R/P, +3 edema noted BLE  Resolved Hospital Problem list      Assessment & Plan:  Stroke Right parietal infarct embolic suspect secondary to atrial fibrillation Suboccipital hematoma in the dorsal soft tissue PMHx: HTN, HLD, CVA On aspirin 81 mg daily and clopidogrel 75 mg daily prior to admission TNK administered 17:59  - Neuro checks & NIHSS Q 15 M x 2 h, q 30 m x 6 h, q 1 h x 16 h - perform bedside swallow > SLP eval if needed - STAT CTH for any mentation change - PT/OT consult - Neurology consulted, appreciate input -  f/u MRI - Echocardiogram ordered - f/u Lipid panel & Hgb A1c  - SCD's for VTE prophylaxis - Q 4 CBG monitoring - follow ICU hypo/hyper-glycemia protocol - Labetalol PRN to maintain BP < 180/105 - LDL goal < 70 - hold outpatient regimen until patient clears swallow eval, then consider restarting Lipitor  Controlled Paroxysmal Atrial Fibrillation  CHA2DS2-VASc score: 5 points - f/u TSH & thyroid panel - consider continuing metoprolol once he clears swallow eval, as hemodynamics allow - Echocardiogram ordered - Cardiology consulted, appreciate input - Continuous cardiac monitoring  Recurrent Falls Parkinson's Confusion - fall precaution - safety monitoring PRN - TOC consultation for discharge planning  Best Practice (right click and "Reselect all SmartList Selections" daily)  Diet/type: NPO DVT prophylaxis: SCD GI prophylaxis: PPI Lines: N/A Foley:  N/A Code Status:  full code Last date of multidisciplinary goals of care discussion [03/24/22]  Labs   CBC: Recent Labs  Lab 03/24/22 1229 03/25/22 0418  WBC 4.2 6.5  NEUTROABS 2.2  --   HGB 12.9* 14.3  HCT 40.5 45.0  MCV 94.8 93.8  PLT 152 170     Basic Metabolic Panel: Recent Labs  Lab 03/24/22 1229 03/25/22 0418  NA 139 142  K 3.7 3.8  CL 106 107  CO2 26 27  GLUCOSE 143* 86  BUN 12 10  CREATININE 1.14 0.99  CALCIUM 8.6* 9.0  MG  --  2.1  PHOS  --  3.2    GFR: Estimated Creatinine Clearance: 47.7 mL/min (by C-G formula based on SCr  of 0.99 mg/dL). Recent  Labs  Lab 03/24/22 1229 03/25/22 0418  WBC 4.2 6.5     Liver Function Tests: Recent Labs  Lab 03/24/22 1229  AST 28  ALT 19  ALKPHOS 88  BILITOT 0.8  PROT 6.7  ALBUMIN 3.3*    No results for input(s): "LIPASE", "AMYLASE" in the last 168 hours. No results for input(s): "AMMONIA" in the last 168 hours.  ABG No results found for: "PHART", "PCO2ART", "PO2ART", "HCO3", "TCO2", "ACIDBASEDEF", "O2SAT"   Coagulation Profile: Recent Labs  Lab 03/24/22 1229  INR 1.0     Cardiac Enzymes: No results for input(s): "CKTOTAL", "CKMB", "CKMBINDEX", "TROPONINI" in the last 168 hours.  HbA1C: Hgb A1c MFr Bld  Date/Time Value Ref Range Status  10/03/2018 01:38 PM 5.8 (H) 4.8 - 5.6 % Final    Comment:    (NOTE)         Prediabetes: 5.7 - 6.4         Diabetes: >6.4         Glycemic control for adults with diabetes: <7.0     CBG: Recent Labs  Lab 03/24/22 1725 03/24/22 2008 03/24/22 2354 03/25/22 0333 03/25/22 0810  GLUCAP 148* 87 98 98 89    Review of Systems:   Challenging interview as patient drowsy, mildly aphasic/dysarthric and confused. No complaints of pain.  Past Medical History:  He,  has a past medical history of Diastolic dysfunction, Falls, Hyperlipidemia, Hypertension, PAF (paroxysmal atrial fibrillation) ([place]), Parkinson disease, Stroke ([place]) (08/2017), and Urosepsis ([place]) (09/2018).   Surgical History:   Past Surgical History:  Procedure Laterality Date   CARDIOVASCULAR STRESS TEST     normal   CARPAL TUNNEL RELEASE  2009   right hand   X-STOP IMPLANTATION     X-STOP IMPLANTATION     Dr. Mauri Pole     Social History:   reports that he quit smoking about 19 years ago. His smoking use included cigarettes. He has a 75.00 pack-year smoking history. He has never used smokeless tobacco. He reports current alcohol use. He reports that he does not use drugs.   Family History:  His family history includes Cancer in his brother  and sister; Heart disease in his brother; Heart disease (age of onset: 13) in his father; Seizures in his child; Stroke in his child.   Allergies No Known Allergies   Home Medications  Prior to Admission medications   Medication Sig Start Date End Date Taking? Authorizing Provider  potassium chloride SA (KLOR-CON M) 20 MEQ tablet Take 20 mEq by mouth daily. 02/17/21  Yes [provider]  tamsulosin (FLOMAX) 0.4 MG CAPS capsule Take 0.4 mg by mouth daily. 12/26/20  Yes [provider]  acetaminophen (TYLENOL) 325 MG tablet Take 2 tablets (650 mg total) by mouth every 6 (six) hours as needed for mild pain (or Fever >/= 101). 02/21/18   Nicholes Mango, MD  aspirin 81 MG tablet Take 81 mg by mouth daily.      [provider]  atorvastatin (LIPITOR) 80 MG tablet Take 1 tablet (80 mg total) by mouth daily at 6 PM. 08/23/17   Demetrios Loll, MD  clopidogrel (PLAVIX) 75 MG tablet Take 1 tablet (75 mg total) by mouth daily. 08/23/17   Demetrios Loll, MD  finasteride (PROSCAR) 5 MG tablet Take 5 mg by mouth daily.    [provider]  metoprolol succinate (TOPROL XL) 50 MG 24 hr tablet Take 1 tablet (50 mg total) by mouth daily. Take with or immediately following a  meal. 10/05/18 10/05/19  Gladstone Lighter, MD  Calcium Carbonate (CALCIUM 500 PO) Take 2 tablets by mouth daily.    01/06/16  [provider]     Critical care provider statement:   Total critical care time: 33 minutes   Performed by: Lanney Gins MD   Critical care time was exclusive of separately billable procedures and treating other patients.   Critical care was necessary to treat or prevent imminent or life-threatening deterioration.   Critical care was time spent personally by me on the following activities: development of treatment plan with patient and/or surrogate as well as nursing, discussions with consultants, evaluation of patient's response to treatment, examination of patient, obtaining history  from patient or surrogate, ordering and performing treatments and interventions, ordering and review of laboratory studies, ordering and review of radiographic studies, pulse oximetry and re-evaluation of patient's condition.    Ottie Glazier, M.D.  Pulmonary & Critical Care Medicine

## 2022-03-25 NOTE — Progress Notes (Signed)
OT Cancellation Note  Patient Details Name: Kevin Bradshaw MRN: [id number] DOB: 02/08/1935   Cancelled Treatment:    Reason Eval/Treat Not Completed: Medical issues which prohibited therapy (pt received TNK last night; hold for 24 hours.)  Vania Rea 03/25/2022, 9:23 AM

## 2022-03-25 NOTE — IPAL (Signed)
GOALS OF CARE FAMILY CONFERENCE   Current clinical status, hospital findings and medical plan was reviewed with family.   Updated and notified of patients ongoing immediate critical medical problems.   Patient remains with poor prognosis due to Dementia, parkinsons, wheel chair bound status, multiple falls in just last week, s/p recurrent CVA.   Explained to family course of therapy and the modalities   Family is appreciative of care and relate understanding that patient is with advanced age and severe multiple comorbid conditions  They have consented and agreed to DNR/DNI  Code status   Family are satisfied with Plan of action and management. All questions answered  Additional Critical Care time 35 mins    Ottie Glazier, M.D.  Pulmonary & [place]

## 2022-03-25 NOTE — Evaluation (Signed)
Clinical/Bedside Swallow Evaluation Patient Details  Name: Kevin Bradshaw MRN: [id number] Date of Birth: Jul 11, 1935  Today's Date: 03/25/2022 Time: SLP Start Time (ACUTE ONLY): 1400 SLP Stop Time (ACUTE ONLY): 1425 SLP Time Calculation (min) (ACUTE ONLY): 25 min  Past Medical History:  Past Medical History:  Diagnosis Date   Diastolic dysfunction    a. 09/2018 Echo: EF 55-60%, mild LVH. Diast dysfxn. Nl RV fxn. Nl LA size.   Falls    Hyperlipidemia    Hypertension    PAF (paroxysmal atrial fibrillation) ([place])    a. 09/2018 in the setting of urosepsis; b. CHA2DS2VASc = 5-->No OAC due to high risk for falls (on ASA/Plavix).   Parkinson disease    Stroke ([place]) 08/2017   Urosepsis ([place]) 09/2018   Past Surgical History:  Past Surgical History:  Procedure Laterality Date   CARDIOVASCULAR STRESS TEST     normal   CARPAL TUNNEL RELEASE  2009   right hand   X-STOP IMPLANTATION     X-STOP IMPLANTATION     Dr. Mauri Pole   HPI:  Per H&P, pt " is a 87 y.o. male with a hx of hypertension, hyperlipidemia, Parkinson's disease, recurrent falls, paroxysmal A-fib and history of stroke . Pt had been in his normal state of health during the day, his official LKW was around 1400 when his son-in-law saw him outside in his wheelchair getting the mail, eating and drinking a beer. Later on his son-in-law saw him lethargic in the kitchen, which is not unusual for the patient who can get fatigued throughout the day. He asked him if he wanted to go to bed, and the patient seemed to mumble no.  The son in law returned outside, then a little while later emergency services arrived at the house responding to a Life alert activation. At some point the patient had pushed his life alert button. When his son-in-law went into the house he noticed the patient developed acute severe dysarthria, L hemineglect, and L sensory deficit.      Family confirms baseline confusion, reporting he knows his name and his family members but  is normally disoriented to time, situation, and place but can wax and wane. He has frequent falls, 2 weeks ago he fell 4 times. He is mostly wheelchair bound, but is able to walk some short distances. He can get confused with how to operate his WC. He feeds himself with difficulty due to a baseline tremor, but is independent in other ADL's such as dressing, bathing, toileting.  Family denies any recent complaints or symptoms of illness. He missed his AM dose of his BB, but has otherwise been taking his medication as prescribed. Upon arrival patient mildly confused with difficulty following commands and unable to provide a clear history."    Assessment / Plan / Recommendation  Clinical Impression   Pt seen today for cognitive-linguistic evaluation. Pt laying in bed w/ eyes closed and lights off upon ST arrival Pt responsive to greeting and alert/cooperative t/o eval. Pt readily engaged in casual conversation. Pt left sitting up in bed w/ call button in reach and bed alarm set.   OM exam completed and notable for min reduced R labial ROM w/ retraction. Noted pt w/ gravelly vocal quality. Otherwise, WFL.  Assessment completed via informal means and portions of [employer] Mental Status (SLUMS) examination. Pt appears to present w/ mod cognitive impairment per this eval.  Pt exhibits relative strengths in conversational informal speech including discussion of his past vocation,  independence w/ ADLs, and his family members. Pt appropriately followed one-step directions to participate in OM exam. Pt oriented to self, month, year, state, and city. Pt exhibited a relative strength in responding appropriately and accurately to yes/no and open-ended questions. Pt also able to answer problem-solving questions regarding calling 911 in emergencies. Pt demonstrated difficulty w/ immediate recall, mental calculations, organization, self-monitoring, divergent naming, and visual-spatial awareness. Pt unable to  answer problem solving question regarding nurse call button. Unsure of pt's baseline cognitive status and if this presentation is consistent w/ his baseline cognitive impairment.  Regarding speech, noted pt's perceptual speech characteristics appeared consistent w/ Apraxic Dysarthria c/b inconsistent speech characteristics including imprecise articulation and rate of speech. Pt's imprecise articulation was not appreciated this morning around 1000 and negatively impacted overall intelligibility. Pt's articulation precision and rate of speech inconsistent t/o eval. Pt aware of speech deficits when prompted. Pt educated on dysarthria compensatory strategies w/ a focus on increasing loudness if intelligibility is reduced. He exhibited understanding of this strategy by increasing loudness when prompted, which appeared helpful in improving intelligibility.  Pt educated on SLP POC and compensatory strategies. Pt appreciative and agreed. Unsure of pt's full comprehension of information d/t cognitive status.  Recommend acute ST therapy to target dysarthria tx to provide compensatory strategies/practice for improving intelligibility. Recommend ST to target cognitive deficits at next venue of care. Pt/RN updated and agreed.  SLP Visit Diagnosis: Dysarthria and anarthria (R47.1);Cognitive communication deficit (R41.841)    Aspiration Risk  Mild aspiration risk (d/t cognitive status)    Diet Recommendation   N/A  Medication Administration: Whole meds with liquid (educate one at a time)    Other  Recommendations Oral Care Recommendations: Oral care BID;Oral care before and after PO    Recommendations for follow up therapy are one component of a multi-disciplinary discharge planning process, led by the attending physician.  Recommendations may be updated based on patient status, additional functional criteria and insurance authorization.  Follow up Recommendations Follow physician's recommendations for  discharge plan and follow up therapies      Assistance Recommended at Discharge Frequent or constant Supervision/Assistance  Functional Status Assessment Patient has had a recent decline in their functional status and demonstrates the ability to make significant improvements in function in a reasonable and predictable amount of time.  Frequency and Duration min 2x/week          Prognosis Prognosis for improved oropharyngeal function: Good Barriers to Reach Goals: Cognitive deficits;Language deficits;Severity of deficits      Swallow Study   General Date of Onset: 03/24/22 HPI: Per H&P, pt " is a 87 y.o. male with a hx of hypertension, hyperlipidemia, Parkinson's disease, recurrent falls, paroxysmal A-fib and history of stroke . Pt had been in his normal state of health during the day, his official LKW was around 1400 when his son-in-law saw him outside in his wheelchair getting the mail, eating and drinking a beer. Later on his son-in-law saw him lethargic in the kitchen, which is not unusual for the patient who can get fatigued throughout the day. He asked him if he wanted to go to bed, and the patient seemed to mumble no.  The son in law returned outside, then a little while later emergency services arrived at the house responding to a Life alert activation. At some point the patient had pushed his life alert button. When his son-in-law went into the house he noticed the patient developed acute severe dysarthria, L hemineglect, and L sensory  deficit.      Family confirms baseline confusion, reporting he knows his name and his family members but is normally disoriented to time, situation, and place but can wax and wane. He has frequent falls, 2 weeks ago he fell 4 times. He is mostly wheelchair bound, but is able to walk some short distances. He can get confused with how to operate his WC. He feeds himself with difficulty due to a baseline tremor, but is independent in other ADL's such as  dressing, bathing, toileting.  Family denies any recent complaints or symptoms of illness. He missed his AM dose of his BB, but has otherwise been taking his medication as prescribed. Upon arrival patient mildly confused with difficulty following commands and unable to provide a clear history." Type of Study: Bedside Swallow Evaluation Diet Prior to this Study: NPO Temperature Spikes Noted: No (WBC 6.5) Respiratory Status: Room air History of Recent Intubation: No Behavior/Cognition: Alert;Cooperative;Pleasant mood Oral Cavity Assessment: Within Functional Limits Oral Care Completed by SLP: No Oral Cavity - Dentition: Dentures, top Self-Feeding Abilities: Total assist (Pt wearing safety mittens) Patient Positioning: Upright in bed Baseline Vocal Quality: Normal Volitional Cough:  (NT) Volitional Swallow: Able to elicit    Oral/Motor/Sensory Function Overall Oral Motor/Sensory Function: Mild impairment Facial ROM: Reduced right Facial Symmetry: Abnormal symmetry right Facial Strength: Within Functional Limits Lingual ROM: Within Functional Limits Lingual Symmetry: Within Functional Limits Lingual Strength: Within Functional Limits Lingual Sensation: Within Functional Limits Mandible: Within Functional Limits   Ice Chips Ice chips: Within functional limits Presentation: Spoon (x2)   Thin Liquid Thin Liquid: Within functional limits Presentation: Straw;Cup (~ 3 oz)    Nectar Thick Nectar Thick Liquid: Not tested   Honey Thick Honey Thick Liquid: Not tested   Puree Puree: Within functional limits Presentation: Spoon (5 bites)   Solid     Solid: Within functional limits Presentation:  (x half graham cracker)     Randall Hiss Graduate Clinician [place], Speech Pathology   Randall Hiss 03/25/2022,2:37 PM

## 2022-03-25 NOTE — Progress Notes (Signed)
Admit/Q37mn neuro checks

## 2022-03-25 NOTE — Progress Notes (Signed)
*  PRELIMINARY RESULTS* Echocardiogram 2D Echocardiogram has been performed.  Kevin Bradshaw 03/25/2022, 8:14 AM

## 2022-03-25 NOTE — Evaluation (Addendum)
Clinical/Bedside Swallow Evaluation Patient Details  Name: Kevin Bradshaw MRN: [id number] Date of Birth: 06-Apr-1935  Today's Date: 03/25/2022 Time: SLP Start Time (ACUTE ONLY): 16 SLP Stop Time (ACUTE ONLY): 1100 SLP Time Calculation (min) (ACUTE ONLY): 20 min  Past Medical History:  Past Medical History:  Diagnosis Date   Diastolic dysfunction    a. 09/2018 Echo: EF 55-60%, mild LVH. Diast dysfxn. Nl RV fxn. Nl LA size.   Falls    Hyperlipidemia    Hypertension    PAF (paroxysmal atrial fibrillation) ([place])    a. 09/2018 in the setting of urosepsis; b. CHA2DS2VASc = 5-->No OAC due to high risk for falls (on ASA/Plavix).   Parkinson disease    Stroke ([place]) 08/2017   Urosepsis ([place]) 09/2018   Past Surgical History:  Past Surgical History:  Procedure Laterality Date   CARDIOVASCULAR STRESS TEST     normal   CARPAL TUNNEL RELEASE  2009   right hand   X-STOP IMPLANTATION     X-STOP IMPLANTATION     Dr. Mauri Pole   HPI:  Per H&P, pt " is a 87 y.o. male with a hx of hypertension, hyperlipidemia, Parkinson's disease, recurrent falls, paroxysmal A-fib and history of stroke . Pt had been in his normal state of health during the day, his official LKW was around 1400 when his son-in-law saw him outside in his wheelchair getting the mail, eating and drinking a beer. Later on his son-in-law saw him lethargic in the kitchen, which is not unusual for the patient who can get fatigued throughout the day. He asked him if he wanted to go to bed, and the patient seemed to mumble no.  The son in law returned outside, then a little while later emergency services arrived at the house responding to a Life alert activation. At some point the patient had pushed his life alert button. When his son-in-law went into the house he noticed the patient developed acute severe dysarthria, L hemineglect, and L sensory deficit.      Family confirms baseline confusion, reporting he knows his name and his family members but  is normally disoriented to time, situation, and place but can wax and wane. He has frequent falls, 2 weeks ago he fell 4 times. He is mostly wheelchair bound, but is able to walk some short distances. He can get confused with how to operate his WC. He feeds himself with difficulty due to a baseline tremor, but is independent in other ADL's such as dressing, bathing, toileting.  Family denies any recent complaints or symptoms of illness. He missed his AM dose of his BB, but has otherwise been taking his medication as prescribed. Upon arrival patient mildly confused with difficulty following commands and unable to provide a clear history."    Assessment / Plan / Recommendation  Clinical Impression   Pt seen today for BSE. Pt laying in bed w/ son and daughter present upon ST arrival. Pt alert, cooperative, and pleasant t/o eval. Pt communicated his desire to eat/drink. Family reported pt was feeling very hungry and thirsty prior to eval. Pt left sitting upright in bed w/ bed alarm set, call button in reach, and family present.  Of note: Pt wears top dentures during meals. Of note: Pt spoke readily w/ ST during eval. Pt answered questions and responded appropriately in casual conversation. Pt's speech intelligible and fluent. Full cognitive-linguistic eval to be done at a later time. Pt on RA; afebrile; WBC WNL.  Pt does not appear to  present w/ any overt, apparent s/s of oropharyngeal dysphagia at this time. Pt's swallow appeared functional and at pt's baseline during this eval. Noted pt w/ hx of cognitive impairment/confusion per chart review. ANY pt w/ a cognitive impairment is at increased risk of aspiration/aspiration pneumonia. Following General aspiration precautions can help reduce this risk.  Administered trials of ice chips, thin liquids, puree, and solids. ST provided full assist for feeding d/t safety mitts in place. Family reported pt typically feeds self at home although he occasionally has  difficulty self-feeding d/t hand tremor (hx Parkinson's noted). During oral phase, pt exhibited good bolus control/management, timely A-P bolus transfer, and clear oral cavity post-po's. During pharyngeal phase, pt exhibited seemingly timely pharyngeal swallow and clear vocal quality post-po's. No overt, consistent s/s of aspiration noted such as coughing, throat clearing, nor wet vocal quality. Noted throat clear x1 w/ first trial of ice chip -- however, this was not consistent w/ subsequent ice chips nor 10+ trials of thin liquids.  Pt/family educated on General aspiration precautions, recommendation to take pills one at a time, and diet recommendation. Pt/family appreciative and agreed.   Recommend regular diet w/ thin liquids via cup or straw. Follow General aspiration precautions during meals. Full assist w/ feeding while safety mitts in place. Recommend pt self feeds as able if safety mitts are removed. Recommend pills whole w/ liquid one at a time.  No acute ST needs for Dysphagia at this time. ST will s/o for dysphagia needs. ST will complete full cognitive/linguistic assessment at a later time.  SLP Visit Diagnosis: Dysphagia, unspecified (R13.10)    Aspiration Risk  Mild aspiration risk (d/t cognitive status)    Diet Recommendation   Recommend regular diet w/ thin liquids via cup or straw. Follow General aspiration precautions during meals.  Medication Administration: Whole meds with liquid (educate one at a time)    Other  Recommendations Oral Care Recommendations: Oral care BID;Oral care before and after PO    Recommendations for follow up therapy are one component of a multi-disciplinary discharge planning process, led by the attending physician.  Recommendations may be updated based on patient status, additional functional criteria and insurance authorization.  Follow up Recommendations Follow physician's recommendations for discharge plan and follow up therapies       Assistance Recommended at Discharge  Full  Functional Status Assessment Patient has had a recent decline in their functional status and demonstrates the ability to make significant improvements in function in a reasonable and predictable amount of time.  Frequency and Duration            Prognosis Prognosis for improved oropharyngeal function: Good Barriers to Reach Goals: Cognitive deficits;Language deficits;Severity of deficits      Swallow Study   General Date of Onset: 03/24/22 HPI: Per H&P, pt " had been in his normal state of health during the day, his official LKW was around 1400 when his son-in-law saw him outside in his wheelchair getting the mail, eating and drinking a beer. Later on his son-in-law saw him lethargic in the kitchen, which is not unusual for the patient who can get fatigued throughout the day. He asked him if he wanted to go to bed, and the patient seemed to mumble no.  The son in law returned outside, then a little while later emergency services arrived at the house responding to a Life alert activation. At some point the patient had pushed his life alert button. When his son-in-law went into the house he  noticed the patient developed acute severe dysarthria, L hemineglect, and L sensory deficit.      Family confirms baseline confusion, reporting he knows his name and his family members but is normally disoriented to time, situation, and place but can wax and wane. He has frequent falls, 2 weeks ago he fell 4 times. He is mostly wheelchair bound, but is able to walk some short distances. He can get confused with how to operate his WC. He feeds himself with difficulty due to a baseline tremor, but is independent in other ADL's such as dressing, bathing, toileting.  Family denies any recent complaints or symptoms of illness. He missed his AM dose of his BB, but has otherwise been taking his medication as prescribed. Upon arrival patient mildly confused with difficulty  following commands and unable to provide a clear history." Type of Study: Bedside Swallow Evaluation Diet Prior to this Study: NPO Temperature Spikes Noted: No (WBC 6.5) Respiratory Status: Room air History of Recent Intubation: No Behavior/Cognition: Alert;Cooperative;Pleasant mood Oral Cavity Assessment: Within Functional Limits Oral Care Completed by SLP: No Oral Cavity - Dentition: Dentures, top Self-Feeding Abilities: Total assist (Pt wearing safety mittens) Patient Positioning: Upright in bed Baseline Vocal Quality: Normal Volitional Cough:  (NT) Volitional Swallow: Able to elicit    Oral/Motor/Sensory Function Overall Oral Motor/Sensory Function: Within functional limits   Ice Chips Ice chips: Within functional limits Presentation: Spoon (x2)   Thin Liquid Thin Liquid: Within functional limits Presentation: Straw;Cup (~ 3 oz)    Nectar Thick Nectar Thick Liquid: Not tested   Honey Thick Honey Thick Liquid: Not tested   Puree Puree: Within functional limits Presentation: Spoon (5 bites)   Solid     Solid: Within functional limits Presentation:  (x half graham cracker)     Randall Hiss Graduate Clinician [place], Speech Pathology   Randall Hiss 03/25/2022,11:16 AM

## 2022-03-25 NOTE — Progress Notes (Signed)
RN contacted Palliative per patient's POA request regarding meeting. Palliative informed RN it will be tomorrow before they will be able to meet with family. This RN called and left voicemail for POA to return call regarding meeting for patient.   RN offered to assist patient with lunch at this time. Patient is resting with eyes closed and said he wanted to continue resting for right now. I will continue to offer assistance with food.

## 2022-03-25 NOTE — Evaluation (Cosign Needed)
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 ([place]) 03/25/2022   Personal history of fall 03/25/2022   CVA (cerebral vascular accident) ([place]) 03/24/2022   UTI (urinary tract infection) 10/02/2018   Atrial fibrillation with RVR ([place]) 10/02/2018   Sepsis ([place]) 02/18/2018   Acute CVA (cerebrovascular accident) ([place]) 08/22/2017   Right leg weakness 08/21/2017   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 09/2018 Echo: EF 55-60%, mild LVH. Diast dysfxn. Nl RV fxn. Nl LA size.   Falls    Hyperlipidemia    Hypertension    PAF (paroxysmal atrial fibrillation) ([place])    a. 09/2018 in the setting of urosepsis; b. CHA2DS2VASc = 5-->No OAC due to high risk for falls (on ASA/Plavix).   Parkinson disease    Stroke ([place]) 08/2017   Urosepsis ([place]) 09/2018   Past Surgical History:  Past Surgical History:  Procedure Laterality Date   CARDIOVASCULAR STRESS TEST     normal   CARPAL TUNNEL RELEASE  2009   right hand   X-STOP IMPLANTATION     X-STOP IMPLANTATION     Dr. Mauri Pole   HPI:  Per H&P, pt " is a Kevin Bradshaw with a hx of hypertension, hyperlipidemia, Parkinson's disease, recurrent falls, paroxysmal A-fib and history of stroke . Pt had been in his normal state of health during the day, his official LKW was around 1400 when his son-in-law saw him outside in his wheelchair getting the mail, eating and drinking a beer. Later on his  son-in-law saw him lethargic in the kitchen, which is not unusual for the patient who can get fatigued throughout the day. He asked him if he wanted to go to bed, and the patient seemed to mumble no.  The son in law returned outside, then a little while later emergency services arrived at the house responding to a Life alert activation. At some point the patient had pushed his life alert button. When his son-in-law went into the house he noticed the patient developed acute severe dysarthria, L hemineglect, and L sensory deficit.      Family confirms baseline confusion, reporting he knows his name and his family members but is normally disoriented to time, situation, and place but can wax and wane. He has frequent falls, 2 weeks ago he fell 4 times. He is mostly wheelchair bound, but is able to walk some short distances. He can get confused with how to operate his WC. He feeds himself with difficulty due to a baseline tremor, but is independent in other ADL's such as dressing, bathing, toileting.  Family denies any recent complaints or symptoms of illness. He missed his AM dose of his BB, but has otherwise been taking his medication as prescribed. Upon arrival patient mildly confused with difficulty following commands and unable to provide a clear history."   Assessment / Plan / Recommendation Clinical Impression   Pt seen today for cognitive-linguistic evaluation. Pt laying in bed w/ eyes closed and lights off upon ST arrival  Pt responsive to greeting and alert/cooperative t/o eval. Pt readily engaged in casual conversation. Pt left sitting up in bed w/ call button in reach and bed alarm set.    OM exam completed and notable for min reduced R labial ROM w/ retraction. Noted pt w/ gravelly vocal quality. Otherwise, WFL.   Assessment completed via informal means and portions of [employer] Mental Status (SLUMS) examination. Pt appears to present w/ mod cognitive impairment per this eval.   Pt  exhibits relative strengths in conversational informal speech including discussion of his past vocation, independence w/ ADLs, and his family members. Pt appropriately followed one-step directions to participate in OM exam. Pt oriented to self, month, year, state, and city. Pt exhibited a relative strength in responding appropriately and accurately to yes/no and open-ended questions. Pt also able to answer problem-solving questions regarding calling 911 in emergencies.   Pt demonstrated difficulty w/ immediate recall, mental calculations, organization, self-monitoring, divergent naming, and visual-spatial awareness. Pt unable to answer problem solving question regarding nurse call button. Unsure of pt's baseline cognitive status and if this presentation is consistent w/ his baseline cognitive impairment. No family present to provide collateral.    Regarding speech, noted pt's perceptual speech characteristics appeared consistent w/ Ataxic Dysarthria c/b inconsistent speech characteristics including imprecise articulation, rate/rhythm, and vocal loudness. Pt's imprecise articulation was not appreciated this morning around 1000 and negatively impacted overall intelligibility. Pt's articulation precision and rate of speech inconsistent t/o eval. Pt aware of speech deficits when prompted. Pt educated on dysarthria compensatory strategies w/ a focus on increasing loudness if intelligibility is reduced. He exhibited understanding of this strategy by increasing loudness when prompted, which appeared helpful in improving intelligibility.   Pt educated on SLP POC and compensatory strategies. Pt appreciative and agreed. Unsure of pt's full comprehension of information d/t cognitive status.   Recommend acute ST therapy to target dysarthria tx to provide compensatory strategies/practice for improving intelligibility. Recommend ST to target cognitive deficits at next venue of care. Pt/RN updated and agreed.    SLP  Assessment  SLP Recommendation/Assessment: Patient needs continued Speech Lanaguage Pathology Services SLP Visit Diagnosis: Dysarthria and anarthria (R47.1);Cognitive communication deficit (R41.841)    Recommendations for follow up therapy are one component of a multi-disciplinary discharge planning process, led by the attending physician.  Recommendations may be updated based on patient status, additional functional criteria and insurance authorization.    Follow Up Recommendations  Follow physician's recommendations for discharge plan and follow up therapies    Assistance Recommended at Discharge  Frequent or constant Supervision/Assistance  Functional Status Assessment Patient has had a recent decline in their functional status and demonstrates the ability to make significant improvements in function in a reasonable and predictable amount of time.  Frequency and Duration min 2x/week  2 weeks      SLP Evaluation Cognition  Overall Cognitive Status: History of cognitive impairments - at baseline Arousal/Alertness: Awake/alert Orientation Level: Oriented to person;Oriented to place;Oriented to time Year: 2024 Month: March Attention: Focused Focused Attention: Appears intact Memory: Impaired Memory Impairment: Decreased recall of new information Awareness: Appears intact Problem Solving: Impaired Problem Solving Impairment: Functional basic       Comprehension  Auditory Comprehension Overall Auditory Comprehension: Appears within functional limits for tasks assessed Yes/No Questions: Within Functional Limits Commands: Within Functional Limits Conversation: Simple Visual Recognition/Discrimination Discrimination: Not tested Reading Comprehension Reading Status: Not tested    Expression Expression Primary Mode of Expression: Verbal Verbal Expression Overall Verbal Expression: Appears  within functional limits for tasks assessed Initiation: No impairment Level of  Generative/Spontaneous Verbalization: Sentence;Phrase;Word;Conversation Naming: Not tested Pragmatics: No impairment Interfering Components: Speech intelligibility Non-Verbal Means of Communication: Not applicable Written Expression Dominant Hand: Right Written Expression: Not tested   Oral / Motor  Oral Motor/Sensory Function Overall Oral Motor/Sensory Function: Mild impairment Facial ROM: Reduced right Facial Symmetry: Abnormal symmetry right Facial Strength: Within Functional Limits Lingual ROM: Within Functional Limits Lingual Symmetry: Within Functional Limits Lingual Strength: Within Functional Limits Lingual Sensation: Within Functional Limits Mandible: Within Functional Limits Motor Speech Overall Motor Speech: Impaired Respiration: Within functional limits Phonation: Other (comment) ([place]ravel[place]) Resonance: Within functional limits Articulation: Impaired Level of Impairment: Conversation Intelligibility: Intelligibility reduced Word: 75-100% accurate Phrase: 75-100% accurate Sentence: 75-100% accurate Conversation: 75-100% accurate Motor Planning: Witnin functional limits Motor Speech Errors: Aware;Inconsistent Effective Techniques: Over-articulate;Increased vocal intensity            Randall Hiss Graduate Clinician [place], Speech Pathology   Randall Hiss 03/25/2022, 3:12 PM

## 2022-03-25 NOTE — ED Notes (Signed)
BP 137/76 prior to TNK administration.

## 2022-03-25 NOTE — Progress Notes (Signed)
Neurology progress note  S: Patient states that he feels much better today. Still severely dysarthric with stutter and perseveration. No new complaints today. CNS imaging 24 hrs post TNK scheduled for this evening. Patient's code status was changed to DNR today during family meeting with palliative care.  O:  Vitals:   03/25/22 1600 03/25/22 1700  BP: 110/70 (!) 94/59  Pulse:    Resp:    Temp: 98.2 F (36.8 C) 98.3 F (36.8 C)  SpO2:     Physical Exam Gen: A&Ox2, some difficulty following simple commands Resp: normal WOB CV: extremities appear well-perfused   Neuro: *MS: A&Ox2, some difficulty following simple commands *Speech: severe dysarthria, no aphasia *CN: PERRL 50m, EOMI, VFF by confrontation, sensation intact, smile symmetric, hearing intact to voice *Motor:   Normal bulk.  No tremor, rigidity or bradykinesia. No drift in any extremity. Slight proximal weakness of LUE relative to R. *Sensory: L sensory deficit, Extinction to DSS *Coordination:  FNF bilat with dysmetria but no frank ataxia *Reflexes:  2+ symm throughout, toes w/d only bilat *Gait: deferred   NIHSS   1a Level of Conscious.: 0 1b LOC Questions: 2 1c LOC Commands: 1 2 Best Gaze: 0 3 Visual: 0 4 Facial Palsy: 0 5a Motor Arm - left: 0 5b Motor Arm - Right: 0 6a Motor Leg - Left: 0 6b Motor Leg - Right: 0 7 Limb Ataxia: 0 8 Sensory: 1 9 Best Language: 0 10 Dysarthria: 2 11 Extinct. and Inatten.: 1   TOTAL: 7  Data  CT head  1. Acute right MCA territory predominantly involving the parietal lobe infarct. No hemorrhage. 2. Aspects is 8.  CTA H&N  1. No large vessel occlusion. 2. 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 3. Less than 50% stenosis of the proximal right cervical ICA secondary to mixed plaque. 4. Hematoma in the suboccipital dorsal soft tissues. 5. Aortic Atherosclerosis  (ICD10-I70.0).  CNS imaging personally reviewed; I agree with radiology interpretations  A/P:  This is a 87yo man with hx a fib not on anticoagulation 2/2 frequent falls, HL, HTN, Parkinson disease, stroke 2019 who presents with acute onset severe dysarthria, L hemineglect, and L sensory deficit 2/2 acute R parietal infarct s/p TNK administered 03/24/22 evening.  Etiology favored to be embolic given patient presented in a fib. He has a known history pAF but was not on anticoagulation PTA 2/2 frequent falls. He was seen by cardiology today who plans to discuss with family whether he might be a candidate for short trial of anticoagulation with close f/u with EP for consideration of Watchman device. Patient is within 24 hrs of TNK and is currently not receiving anticoagulation or antiplatelets. He should not be anticoagulated within 7 days of his acute ischemic stroke (size of infarct will be determined by MRI later this evening).  - Neurochecks and NIHSS documentation per post-tNK protocol - STAT head CT for any change in neurologic exam - MRI brain 24 hrs post-tNK r/o hemorrhagic conversion is scheduled for this evening - I will review his imaging tomorrow AM and start him on DAPT and DVT prophylaxis if no hemorrhage on MRI - keep SBP less than 180/105 for the 1st 24 hours post IV t-NK to reduce the risk of hemorrhagic transformation - f/u TTE - Patient should not be anticoagulated within 7 days of acute ischemic stroke - Cardiology to discuss with family whether he might be a  candidate for short trial of anticoagulation with close f/u with EP for consideration of Watchman device - PT/OT and speech therapy - stroke education - outpatient f/u with neurology after discharge  Neurology will continue to follow  This patient is critically ill and at significant risk of neurological worsening, death and care requires constant monitoring of vital signs, hemodynamics,respiratory and cardiac  monitoring, neurological assessment, discussion with family, other specialists and medical decision making of high complexity. I spent 40 minutes of neurocritical care time  in the care of  this patient. This was time spent independent of any time provided by nurse practitioner or PA.  Su Monks, MD [facility] [phone]  If 7pm- 7am, please page neurology on call as listed in [place].

## 2022-03-25 NOTE — Progress Notes (Signed)
RN assisted patient with lunch and ordered patient's dinner.

## 2022-03-25 NOTE — Consult Note (Signed)
Cardiology Consultation   Patient ID: Kevin Bradshaw MRN: [id number]; DOB: 1935-03-04  Admit date: 03/24/2022 Date of Consult: 03/25/2022  PCP:  [name], MD   [facility] Cardiologist:  Nelva Bush, MD   {   Patient Profile:   Kevin Bradshaw is a 87 y.o. male with a hx of hypertension, hyperlipidemia, Parkinson's disease, recurrent falls, paroxysmal A-fib and history of stroke who is being seen 03/25/2022 for the evaluation of atrial fibrillation at the request of Dr Lanney Gins.  History of Present Illness:   Kevin Bradshaw has been seen by heart care in the past.  Patient was admitted in August 2019 for stroke.  Echo during admission showed LVEF 55 to 60%.  He was placed on aspirin and Plavix.  In September 2020 he was admitted for progressive weakness and multiple falls.  He was diagnosed with urosepsis was initially in sinus rhythm with PACs but subsequently developed paroxysmal A-fib.  He was placed on beta-blocker therapy with clinical improvement, he converted to sinus rhythm.   CHA2DS2-VASc of 5.  Patient was deemed not a good candidate for anticoagulation due to frequent falls.  Has been on aspirin and Plavix since his stroke.  Echo showed LVEF 55 to 60%, mild LVH, elevated mean left atrial pressure, normal RV SF, moderate calcification of the mitral valve leaflet. Patient was lost to follow-up.  Patient presented to [facility] ER 03/24/2022 with code stroke.  Patient presented via EMS with complaints of aphasia and dysarthria.  Stroke was activated prior to arrival patient arrived to the CT scanner.  Family provided history, who noted that he was in his normal state of health until approximately 1400.  He went out to do some yard work and came back was dysarthric.  In the ER blood pressure 118/70, pulse rate 92 bpm, respiratory rate 18, afebrile, 98% O2.  Labs showed hemoglobin 12.9 albumin 3.3, sodium 139, potassium 3.7, serum creatinine 1.14, BUN 12, WBC 4.2..   EKG showed atrial fibrillation with a heart rate of 96 bpm.  CT of the head showed acute right MCA territory stroke, no hemorrhage. TNK was administered.  Post TNKase CT revealed a suboccipital hematoma dorsal soft tissue.  Neurology did not recommend reversal of TNK.  Patient subsequently had right-sided facial flushing after taking IV contrast received Benadryl. Repeat head imaging did not show worsening hematoma. Critical care was asked to admit.  Past Medical History:  Diagnosis Date   Diastolic dysfunction    a. 09/2018 Echo: EF 55-60%, mild LVH. Diast dysfxn. Nl RV fxn. Nl LA size.   Falls    Hyperlipidemia    Hypertension    PAF (paroxysmal atrial fibrillation) ([place])    a. 09/2018 in the setting of urosepsis; b. CHA2DS2VASc = 5-->No OAC due to high risk for falls (on ASA/Plavix).   Parkinson disease    Stroke ([place]) 08/2017   Urosepsis ([place]) 09/2018    Past Surgical History:  Procedure Laterality Date   CARDIOVASCULAR STRESS TEST     normal   CARPAL TUNNEL RELEASE  2009   right hand   X-STOP IMPLANTATION     X-STOP IMPLANTATION     Dr. Mauri Pole     Home Medications:  Prior to Admission medications   Medication Sig Start Date End Date Taking? Authorizing Provider  acetaminophen (TYLENOL) 325 MG tablet Take 2 tablets (650 mg total) by mouth every 6 (six) hours as needed for mild pain (or Fever >/= 101). 02/21/18  Yes Gouru,  Illene Silver, MD  aspirin 81 MG tablet Take 81 mg by mouth daily.     Yes [provider]  atorvastatin (LIPITOR) 80 MG tablet Take 1 tablet (80 mg total) by mouth daily at 6 PM. 08/23/17  Yes Demetrios Loll, MD  clopidogrel (PLAVIX) 75 MG tablet Take 1 tablet (75 mg total) by mouth daily. 08/23/17  Yes Demetrios Loll, MD  ferrous sulfate 325 (65 FE) MG EC tablet Take 325 mg by mouth daily with breakfast. 10/13/20  Yes [provider]  finasteride (PROSCAR) 5 MG tablet Take 5 mg by mouth daily.   Yes [provider]  metoprolol succinate (TOPROL XL) 50  MG 24 hr tablet Take 1 tablet (50 mg total) by mouth daily. Take with or immediately following a meal. 10/05/18 03/24/22 Yes Gladstone Lighter, MD  potassium chloride SA (KLOR-CON M) 20 MEQ tablet Take 20 mEq by mouth daily. 02/17/21  Yes [provider]  tamsulosin (FLOMAX) 0.4 MG CAPS capsule Take 0.4 mg by mouth daily. 12/26/20  Yes [provider]  Calcium Carbonate (CALCIUM 500 PO) Take 2 tablets by mouth daily.    01/06/16  [provider]    Inpatient Medications: Scheduled Meds:   stroke: early stages of recovery book   Does not apply Once   Chlorhexidine Gluconate Cloth  6 each Topical Daily   diphenhydrAMINE  25 mg Intravenous Once   mupirocin ointment  1 Application Nasal BID   pantoprazole (PROTONIX) IV  40 mg Intravenous QHS   Continuous Infusions:  PRN Meds: acetaminophen **OR** acetaminophen (TYLENOL) oral liquid 160 mg/5 mL **OR** acetaminophen, labetalol, senna-docusate  Allergies:   No Known Allergies  Social History:   Social History   Socioeconomic History   Marital status: Widowed    Spouse name: Not on file   Number of children: 3   Years of education: Not on file   Highest education level: Not on file  Occupational History   Occupation: Retired    Fish farm manager: retired    Comment: service station employee  Tobacco Use   Smoking status: Former    Packs/day: 1.50    Years: 50.00    Additional pack years: 0.00    Total pack years: 75.00    Types: Cigarettes    Quit date: 11/04/2002    Years since quitting: 19.4   Smokeless tobacco: Never  Vaping Use   Vaping Use: Never used  Substance and Sexual Activity   Alcohol use: Yes    Comment: once a week   Drug use: No   Sexual activity: Not on file  Other Topics Concern   Not on file  Social History Narrative   Not on file   Social Determinants of Health   Financial Resource Strain: Not on file  Food Insecurity: Not on file  Transportation Needs: Not on file  Physical  Activity: Not on file  Stress: Not on file  Social Connections: Not on file  Intimate Partner Violence: Not on file    Family History:    Family History  Problem Relation Age of Onset   Heart disease Father 48   Cancer Sister        ? type   Heart disease Brother    Cancer Brother        ? type   Stroke Child    Seizures Child      ROS:  Please see the history of present illness.   All other ROS reviewed and negative.  Physical Exam/Data:   Vitals:   03/25/22 0400 03/25/22 0430 03/25/22 0500 03/25/22 0600  BP: (!) 125/103 (!) 125/103 114/77 (!) 119/97  Pulse: 82 (!) 118 (!) 105 88  Resp: '15 20 19 19  '$ Temp: 98.6 F (37 C)     TempSrc:      SpO2: 95% 99% 94% 97%  Weight:      Height:        Intake/Output Summary (Last 24 hours) at 03/25/2022 0749 Last data filed at 03/25/2022 0600 Gross per 24 hour  Intake 922.81 ml  Output 1100 ml  Net -177.19 ml      03/24/2022    8:13 PM 03/24/2022    6:09 PM 10/19/2018    8:57 AM  Last 3 Weights  Weight (lbs) 154 lb 1.6 oz 160 lb 1.6 oz 152 lb 8 oz  Weight (kg) 69.9 kg 72.621 kg 69.174 kg     Body mass index is 28.19 kg/m.  General:  Well nourished, well developed, in no acute distress HEENT: normal Neck: no JVD Vascular: No carotid bruits; Distal pulses 2+ bilaterally Cardiac:  normal S1, S2; Irreg Irreg; no murmur  Lungs:  clear to auscultation bilaterally, no wheezing, rhonchi or rales  Abd: soft, nontender, no hepatomegaly  Ext: no edema Musculoskeletal:  No deformities, BUE and BLE strength normal and equal Skin: warm and dry  Neuro:  CNs 2-12 intact, no focal abnormalities noted Psych:  Normal affect   EKG:  The EKG was personally reviewed and demonstrates:  Afib, 96bpm, RBBB, LPFB, nonspecific T wave changes  Telemetry:  Telemetry was personally reviewed and demonstrates:  Afib 90-110bpm  Relevant CV Studies:  Echo 09/2018 1. Left ventricular ejection fraction, by visual estimation, is 55 to  60%. The  left ventricle has normal function. Normal left ventricular size.  There is mildly increased left ventricular hypertrophy.   2. Elevated mean left atrial pressure.   3. Left ventricular diastolic Doppler parameters are consistent with  pseudonormalization pattern of LV diastolic filling.   4. Global right ventricle has normal systolic function.The right  ventricular size is normal. No increase in right ventricular wall  thickness.   5. Left atrial size was normal.   6. Right atrial size was normal.   7. Moderate calcification of the mitral valve leaflet(s).   8. Moderate thickening of the mitral valve leaflet(s).   9. The mitral valve is degenerative. No evidence of mitral valve  regurgitation.  10. The tricuspid valve is not well visualized. Tricuspid valve  regurgitation is trivial.  11. The aortic valve is tricuspid Aortic valve regurgitation was not  visualized by color flow Doppler. Mild aortic valve sclerosis without  stenosis.  12. The pulmonic valve was normal in structure. Pulmonic valve  regurgitation is trivial by color flow Doppler.  13. TR signal is inadequate for assessing pulmonary artery systolic  pressure.  14. The inferior vena cava is normal in size with greater than 50%  respiratory variability, suggesting right atrial pressure of 3 mmHg.   Laboratory Data:  High Sensitivity Troponin:  No results for input(s): "TROPONINIHS" in the last 720 hours.   Chemistry Recent Labs  Lab 03/24/22 1229 03/25/22 0418  NA 139 142  K 3.7 3.8  CL 106 107  CO2 26 27  GLUCOSE 143* 86  BUN 12 10  CREATININE 1.14 0.99  CALCIUM 8.6* 9.0  MG  --  2.1  GFRNONAA >60 >60  ANIONGAP 7 8    Recent Labs  Lab  03/24/22 1229  PROT 6.7  ALBUMIN 3.3*  AST 28  ALT 19  ALKPHOS 88  BILITOT 0.8   Lipids  Recent Labs  Lab 03/25/22 0418  CHOL 160  TRIG 76  HDL 68  LDLCALC 77  CHOLHDL 2.4    Hematology Recent Labs  Lab 03/24/22 1229 03/25/22 0418  WBC 4.2 6.5  RBC  4.27 4.80  HGB 12.9* 14.3  HCT 40.5 45.0  MCV 94.8 93.8  MCH 30.2 29.8  MCHC 31.9 31.8  RDW 13.9 14.0  PLT 152 170   Thyroid  Recent Labs  Lab 03/25/22 0418  TSH 2.417  FREET4 0.97    BNPNo results for input(s): "BNP", "PROBNP" in the last 168 hours.  DDimer No results for input(s): "DDIMER" in the last 168 hours.   Radiology/Studies:  CT Head Wo Contrast  Result Date: 03/24/2022 CLINICAL DATA:  S/p TNKase.  Now with mental status change. EXAM: CT HEAD WITHOUT CONTRAST TECHNIQUE: Contiguous axial images were obtained from the base of the skull through the vertex without intravenous contrast. RADIATION DOSE REDUCTION: This exam was performed according to the departmental dose-optimization program which includes automated exposure control, adjustment of the mA and/or kV according to patient size and/or use of iterative reconstruction technique. COMPARISON:  Same day CT head and CTA head/neck FINDINGS: Brain: Unchanged appearance of the region infarction in the right parietal lobe. Slightly hyperdense appearance of the sulci in this region is favored to represent contrast staining given recent administration iodinated contrast material. No evidence of hemorrhage. Unchanged size and shape of the ventricular system. Vascular: No hyperdense vessel or unexpected calcification. Skull: Normal. Negative for fracture or focal lesion. Unchanged subcutaneous soft tissues suboccipital hematoma. Sinuses/Orbits: No mastoid or middle ear effusion. Paranasal sinuses are clear. Other: None. IMPRESSION: Unchanged appearance of the region of infarction in the right parietal lobe. Slightly hyperdense appearance of the sulci in this region of infarction is favored to represent contrast staining given recent administration iodinated contrast material. No evidence of hemorrhage. Electronically Signed   By: Marin Roberts M.D.   On: 03/24/2022 18:59   CT ANGIO HEAD NECK W WO CM W PERF (CODE STROKE)  Addendum Date:  03/24/2022   ADDENDUM REPORT: 03/24/2022 18:44 ADDENDUM: CT brain perfusion was subsequently performed. Automated post processing demonstrates small, matched perfusion defects in the right frontal operculum and inferior parietal lobe, consistent with small areas of infarction without surrounding ischemic penumbra. Electronically Signed   By: Emmit Alexanders M.D.   On: 03/24/2022 18:44   Result Date: 03/24/2022 CLINICAL DATA:  Neuro deficit, acute, stroke suspected. Right parietal infarct on CT. EXAM: CT ANGIOGRAPHY HEAD AND NECK TECHNIQUE: Multidetector CT imaging of the head and neck was performed using the standard protocol during bolus administration of intravenous contrast. Multiplanar CT image reconstructions and MIPs were obtained to evaluate the vascular anatomy. Carotid stenosis measurements (when applicable) are obtained utilizing NASCET criteria, using the distal internal carotid diameter as the denominator. RADIATION DOSE REDUCTION: This exam was performed according to the departmental dose-optimization program which includes automated exposure control, adjustment of the mA and/or kV according to patient size and/or use of iterative reconstruction technique. CONTRAST:  151m OMNIPAQUE IOHEXOL 350 MG/ML SOLN COMPARISON:  Head CT 03/24/2022. FINDINGS: CTA NECK FINDINGS Aortic arch: Three-vessel arch configuration. Atherosclerotic calcifications of the aortic arch and arch vessel origins. Arch vessel origins are patent. Right carotid system: Mixed plaque of the right carotid bulb with less than 50% stenosis of the proximal right cervical ICA.  Left carotid system: Predominantly calcified plaque of the left carotid bulb without stenosis. Vertebral arteries: Patent from the origin to the confluence with the basilar without stenosis or dissection. Skeleton: Severe degenerative changes of the cervical spine with multilevel mild-to-moderate stenosis. Other neck: Hematoma in the suboccipital dorsal soft tissues.  Upper chest: Unremarkable. Review of the MIP images confirms the above findings CTA HEAD FINDINGS Anterior circulation: Calcified plaque along the carotid siphons without hemodynamically significant stenosis. The proximal ACAs and MCAs are patent without stenosis or aneurysm. Distal branches are symmetric. Posterior circulation: Normal basilar artery. The SCAs, AICAs and PICAs are patent proximally. Severe stenosis of the left P1 segment with left posterior communicating artery contributing to the left P2 opacification. Distal branches are symmetric. Venous sinuses: As permitted by contrast timing, patent. Anatomic variants: None. Review of the MIP images confirms the above findings IMPRESSION: 1. No large vessel occlusion. 2. 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 3. Less than 50% stenosis of the proximal right cervical ICA secondary to mixed plaque. 4. Hematoma in the suboccipital dorsal soft tissues. 5. Aortic Atherosclerosis (ICD10-I70.0). Electronically Signed: By: Emmit Alexanders M.D. On: 03/24/2022 18:17   CT HEAD CODE STROKE WO CONTRAST  Result Date: 03/24/2022 CLINICAL DATA:  Code stroke.  Left-sided deficits EXAM: CT HEAD WITHOUT CONTRAST TECHNIQUE: Contiguous axial images were obtained from the base of the skull through the vertex without intravenous contrast. RADIATION DOSE REDUCTION: This exam was performed according to the departmental dose-optimization program which includes automated exposure control, adjustment of the mA and/or kV according to patient size and/or use of iterative reconstruction technique. COMPARISON:  CT head 10/02/18 FINDINGS: Brain: Acute to subacute right parietal lobe infarct. No hemorrhage. Advanced generalized volume loss. Sequela of moderate chronic microvascular ischemic change. Chronic infarct in the left lentiform nucleus. Ventriculomegaly that is proportional to the degree of volume  loss. No extra-axial fluid collection. Vascular: No hyperdense vessel or unexpected calcification. Skull: Normal. Negative for fracture or focal lesion. Sinuses/Orbits: No middle ear or mastoid effusion. Paranasal sinuses are clear. Orbits are unremarkable. Other: None. ASPECTS [facility] Stroke Program Early CT Score) - Ganglionic level infarction (caudate, lentiform nuclei, internal capsule, insula, M1-M3 cortex): 6 - Supraganglionic infarction (M4-M6 cortex): 2 Total score (0-10 with 10 being normal): 8 IMPRESSION: 1. Acute right MCA territory predominantly involving the parietal lobe infarct. No hemorrhage. 2. Aspects is 8. Findings were paged to Dr. Quinn Axe via [facility] paging system on 03/24/22 at 5:36 PM. Electronically Signed   By: Marin Roberts M.D.   On: 03/24/2022 17:42     Assessment and Plan:   Atrial Fibrillation -Admitted with acute stroke found to be in rate controlled A-fib and cardiology asked to see -Patient has history of A-fib diagnosed in 2020 in the setting of Urosepsis, not started on anticoagulation due to fall risk.  He has been on aspirin and Plavix since then. - patient remains in Afib with controlled rates - on EKG review, appears to be in and out of afib - CHADSVAC of 5. Given fall risk and recent stroke with subsequent hematoma would wait on a/c - Keep K>4 and Mag>2 - TSH wnl - PTA Toprol '50mg'$  for rate control>held for stroke. Restart when able - echo ordered - may be candidate for [facility], can see EP As outpatient  Acute Stroke s/p TNK - presented with right parietal infarct embolic  - h/o stroke in 2019 - neurology following -  MRI brain today  Recurrent Falls Parkinson's Confusion - continue to monitor - [facility] consulted for discharge planning  For questions or updates, please contact [place] Please consult [email or web address] for contact info under    Signed, Montoya Brandel Ninfa Meeker, PA-C  03/25/2022 7:49 AM

## 2022-03-25 NOTE — Progress Notes (Signed)
Travelled to MRI and back with RN with patient at all times. Uneventful. Vitals collected during MRI and documented in flowsheet. Patient cleaned up with new linens and gown upon arrival back to unit. RN fed patient dinner. Ate approximately 50%.

## 2022-03-25 NOTE — Consult Note (Signed)
Consultation Note Date: 03/25/2022   Patient Name: Kevin Bradshaw  DOB: 02-21-1935  MRN: [id number]  Age / Sex: 87 y.o., male  PCP: [name], MD Referring Physician: Ottie Glazier, MD  Reason for Consultation: Establishing goals of care  HPI/Patient Profile: 87 y.o. male  with past medical history of history of P A-fib not on anticoagulation 2/2 frequent fall, prior stroke 2019, frequent falls last fall 2 weeks ago falling 4 times, mostly wheelchair-bound, Parkinson's, HTN/HLD, HFpEF, former smoker 30-pack-year history, admitted on 03/24/2022 with new stroke, A-fib on arrival to ED.   Clinical Assessment and Goals of Care: I have reviewed medical records including EPIC notes, labs and imaging, received report from RN, assessed the patient.  [name] is lying quietly in bed.  He appears acutely/chronically ill and somewhat frail.  He is resting comfortably, but wakes easily.  He is alert, able to tell me his name, not necessarily where we are.  He is able to state that it is March.  I believe that he can make his basic needs known.  There is no family at bedside at this time.  Bedside nursing staff is present attending to needs.  We reposition [name] in bed.    When asked what happened, [name] is unable to tell me.  He tells me that he worked in a [employer].  He tells me that he has lived in [place] home for 7 or 8 years.  Call to daughter/HCPOA, Kevin Bradshaw, to discuss diagnosis prognosis, GOC, EOL wishes, disposition and options.  I introduced Palliative Medicine as specialized medical care for people living with serious illness. It focuses on providing relief from the symptoms and stress of a serious illness. The goal is to improve quality of life for both the patient and the family.  We discussed a brief life review of the patient. [name] is widower for many years.  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  [name] states that she has a brother and sister (in ALF), but she is healthcare power of attorney.   We then focused on their current illness.  [name] states that she spoke with some physicians this morning who shared that they needed to decide how aggressive they wanted to be with care.  I shared my worry that [name] will have another stroke.  [name] states her worry is her father being at home alone as both she and her husband work during the day.  We plan for a family meeting tomorrow at bedside at 10 AM.  The natural disease trajectory and expectations at EOL were discussed.  Advanced directives, concepts specific to code status, artifical feeding and hydration, and rehospitalization were considered and discussed.  [name] states that her father has elected DNR.  At first she is confused stating that he "does not want a DNR".  When asked how long he would want on life support, she quickly realized that what she meant was that he  did not want attempted resuscitation.  This was verified and orders adjusted.  Discussed the importance of continued conversation with family and the medical providers regarding overall plan of care and treatment options, ensuring decisions are within the context of the patient's values and GOCs.  Questions and concerns were addressed.  The family was encouraged to call with questions or concerns.  PMT will continue to support holistically.  Conference with attending, bedside nursing staff, transition of care team related to patient condition, needs, goals of care.   HCPOA HCPOA -daughter, Kevin Bradshaw.  [name] is a widower for many years.  He has 3 children, a son and another daughter who is in ALF.   SUMMARY OF RECOMMENDATIONS   Continue to treat the treatable but no CPR or intubation Time for outcomes Anticipate need for short-term rehab Would benefit from outpatient palliative/"treat the treatable" hospice care PMT family meeting  3/15 at 42 AM at bedside   Code Status/Advance Care Planning: DNR  Symptom Management:  Per hospitalist/CCM, no additional needs at this time.  Palliative Prophylaxis:  Frequent Pain Assessment, Oral Care, and Turn Reposition  Additional Recommendations (Limitations, Scope, Preferences): Full Scope Treatment  Psycho-social/Spiritual:  Desire for further Chaplaincy support:no Additional Recommendations: Caregiving  Support/Resources and ICU Family Guide  Prognosis:  Unable to determine, based on outcomes.  Guarded at this point due to chronic illness burden, decreasing functional status, recurrent stroke not on anticoag secondary to falls  Discharge Planning: To Be Determined      Primary Diagnoses: Present on Admission:  CVA (cerebral vascular accident) ([facility] [place])   I have reviewed the medical record, interviewed the patient and family, and examined the patient. The following aspects are pertinent.  Past Medical History:  Diagnosis Date   Diastolic dysfunction    a. 09/2018 Echo: EF 55-60%, mild LVH. Diast dysfxn. Nl RV fxn. Nl LA size.   Falls    Hyperlipidemia    Hypertension    PAF (paroxysmal atrial fibrillation) ([facility])    a. 09/2018 in the setting of urosepsis; b. CHA2DS2VASc = 5-->No OAC due to high risk for falls (on ASA/Plavix).   Parkinson disease    Stroke ([facility]) 08/2017   Urosepsis ([facility]) 09/2018   Social History   Socioeconomic History   Marital status: Widowed    Spouse name: Not on file   Number of children: 3   Years of education: Not on file   Highest education level: Not on file  Occupational History   Occupation: Retired    Fish farm manager: retired    Comment: service station employee  Tobacco Use   Smoking status: Former    Packs/day: 1.50    Years: 50.00    Additional pack years: 0.00    Total pack years: 75.00    Types: Cigarettes    Quit date: 11/04/2002    Years since quitting: 19.4   Smokeless tobacco: Never  Vaping Use   Vaping Use: Never  used  Substance and Sexual Activity   Alcohol use: Yes    Comment: once a week   Drug use: No   Sexual activity: Not on file  Other Topics Concern   Not on file  Social History Narrative   Not on file   Social Determinants of Health   Financial Resource Strain: Not on file  Food Insecurity: Not on file  Transportation Needs: Not on file  Physical Activity: Not on file  Stress: Not on file  Social Connections: Not on file   Family History  Problem Relation Age of Onset   Heart disease Father 87   Cancer Sister        ? type   Heart disease Brother    Cancer Brother        ? type   Stroke Child    Seizures Child    Scheduled Meds:   stroke: early stages of recovery book   Does not apply Once   Chlorhexidine Gluconate Cloth  6 each Topical Daily   diphenhydrAMINE  25 mg Intravenous Once   feeding supplement  237 mL Oral TID BM   metoprolol succinate  25 mg Oral Daily   [START ON 03/26/2022] multivitamin with minerals  1 tablet Oral Daily   mupirocin ointment  1 Application Nasal BID   pantoprazole (PROTONIX) IV  40 mg Intravenous QHS   Continuous Infusions: PRN Meds:.acetaminophen **OR** acetaminophen (TYLENOL) oral liquid 160 mg/5 mL **OR** acetaminophen, labetalol, senna-docusate Medications Prior to Admission:  Prior to Admission medications   Medication Sig Start Date End Date Taking? Authorizing Provider  acetaminophen (TYLENOL) 325 MG tablet Take 2 tablets (650 mg total) by mouth every 6 (six) hours as needed for mild pain (or Fever >/= 101). 02/21/18  Yes Gouru, Illene Silver, MD  aspirin 81 MG tablet Take 81 mg by mouth daily.     Yes [provider]  atorvastatin (LIPITOR) 80 MG tablet Take 1 tablet (80 mg total) by mouth daily at 6 PM. 08/23/17  Yes Demetrios Loll, MD  clopidogrel (PLAVIX) 75 MG tablet Take 1 tablet (75 mg total) by mouth daily. 08/23/17  Yes Demetrios Loll, MD  ferrous sulfate 325 (65 FE) MG EC tablet Take 325 mg by mouth daily with breakfast. 10/13/20   Yes [provider]  finasteride (PROSCAR) 5 MG tablet Take 5 mg by mouth daily.   Yes [provider]  metoprolol succinate (TOPROL XL) 50 MG 24 hr tablet Take 1 tablet (50 mg total) by mouth daily. Take with or immediately following a meal. 10/05/18 03/24/22 Yes Gladstone Lighter, MD  potassium chloride SA (KLOR-CON M) 20 MEQ tablet Take 20 mEq by mouth daily. 02/17/21  Yes [provider]  tamsulosin (FLOMAX) 0.4 MG CAPS capsule Take 0.4 mg by mouth daily. 12/26/20  Yes [provider]  Calcium Carbonate (CALCIUM 500 PO) Take 2 tablets by mouth daily.    01/06/16  [provider]   No Known Allergies Review of Systems  Unable to perform ROS: Acuity of condition    Physical Exam Vitals and nursing note reviewed.  Constitutional:      General: He is not in acute distress.    Appearance: He is ill-appearing.  HENT:     Mouth/Throat:     Mouth: Mucous membranes are moist.  Cardiovascular:     Rate and Rhythm: Normal rate.  Pulmonary:     Effort: Pulmonary effort is normal. No respiratory distress.  Skin:    General: Skin is warm and dry.  Neurological:     Mental Status: He is alert.     Comments: Orientation questionable.  Able to tell me his name, that it is March.  Psychiatric:        Mood and Affect: Mood normal.        Behavior: Behavior normal.     Comments: Calm and cooperative, pleasant     Vital Signs: BP 124/81   Pulse 98   Temp 98.3 F (36.8 C) (Axillary)   Resp 13   Ht '5\' 2"'$  (1.575  m)   Wt 69.9 kg   SpO2 93%   BMI 28.19 kg/m  Pain Scale: 0-10   Pain Score: Asleep   SpO2: SpO2: 93 % O2 Device:SpO2: 93 % O2 Flow Rate: .   IO: Intake/output summary:  Intake/Output Summary (Last 24 hours) at 03/25/2022 1455 Last data filed at 03/25/2022 1300 Gross per 24 hour  Intake 1282.81 ml  Output 1850 ml  Net -567.19 ml    LBM: Last BM Date : 03/25/22 Baseline Weight: Weight: 72.6 kg (Simultaneous filing. User may  not have seen previous data.) Most recent weight: Weight: 69.9 kg     Palliative Assessment/Data:     Time In: 1420 Time Out: 1535 Time Total: 75 minutes  Greater than 50%  of this time was spent counseling and coordinating care related to the above assessment and plan.  Signed by: Drue Novel, NP   Please contact Palliative Medicine Team phone at [phone] for questions and concerns.  For individual provider: See Shea Evans

## 2022-03-25 NOTE — Progress Notes (Signed)
Q1H neuro checks start

## 2022-03-26 ENCOUNTER — Telehealth: Payer: Self-pay | Admitting: *Deleted

## 2022-03-26 DIAGNOSIS — Z7189 Other specified counseling: Secondary | ICD-10-CM | POA: Diagnosis not present

## 2022-03-26 DIAGNOSIS — I639 Cerebral infarction, unspecified: Secondary | ICD-10-CM | POA: Diagnosis not present

## 2022-03-26 DIAGNOSIS — Z515 Encounter for palliative care: Secondary | ICD-10-CM | POA: Diagnosis not present

## 2022-03-26 LAB — RENAL FUNCTION PANEL
Albumin: 3.1 g/dL — ABNORMAL LOW (ref 3.5–5.0)
Anion gap: 6 (ref 5–15)
BUN: 12 mg/dL (ref 8–23)
CO2: 27 mmol/L (ref 22–32)
Calcium: 8.6 mg/dL — ABNORMAL LOW (ref 8.9–10.3)
Chloride: 107 mmol/L (ref 98–111)
Creatinine, Ser: 0.96 mg/dL (ref 0.61–1.24)
GFR, Estimated: >60 mL/min (ref >60)
Glucose, Bld: 103 mg/dL — ABNORMAL HIGH (ref 70–99)
Phosphorus: 3.2 mg/dL (ref 2.5–4.6)
Potassium: 3.8 mmol/L (ref 3.5–5.1)
Sodium: 140 mmol/L (ref 135–145)

## 2022-03-26 LAB — CBC
HCT: 40.6 % (ref 39.0–52.0)
Hemoglobin: 12.9 g/dL — ABNORMAL LOW (ref 13.0–17.0)
MCH: 29.5 pg (ref 26.0–34.0)
MCHC: 31.8 g/dL (ref 30.0–36.0)
MCV: 92.9 fL (ref 80.0–100.0)
Platelets: 150 10*3/uL (ref 150–400)
RBC: 4.37 MIL/uL (ref 4.22–5.81)
RDW: 14 % (ref 11.5–15.5)
WBC: 4.7 10*3/uL (ref 4.0–10.5)
nRBC: 0 % (ref 0.0–0.2)

## 2022-03-26 LAB — HEMOGLOBIN A1C
Hgb A1c MFr Bld: 6.1 % — ABNORMAL HIGH (ref 4.8–5.6)
Mean Plasma Glucose: 128 mg/dL

## 2022-03-26 LAB — MAGNESIUM: Magnesium: 1.9 mg/dL (ref 1.7–2.4)

## 2022-03-26 LAB — GLUCOSE, CAPILLARY
Glucose-Capillary: 100 mg/dL — ABNORMAL HIGH (ref 70–99)
Glucose-Capillary: 120 mg/dL — ABNORMAL HIGH (ref 70–99)
Glucose-Capillary: 121 mg/dL — ABNORMAL HIGH (ref 70–99)
Glucose-Capillary: 129 mg/dL — ABNORMAL HIGH (ref 70–99)
Glucose-Capillary: 98 mg/dL (ref 70–99)

## 2022-03-26 MED ORDER — ATORVASTATIN CALCIUM 20 MG PO TABS
80.0000 mg | ORAL_TABLET | Freq: Every day | ORAL | Status: DC
  Administered 2022-03-26 – 2022-03-31 (×6): 80 mg via ORAL
  Filled 2022-03-26 (×7): qty 4

## 2022-03-26 MED ORDER — EZETIMIBE 10 MG PO TABS
10.0000 mg | ORAL_TABLET | Freq: Every day | ORAL | Status: DC
  Administered 2022-03-26 – 2022-04-01 (×7): 10 mg via ORAL
  Filled 2022-03-26 (×7): qty 1

## 2022-03-26 MED ORDER — ASPIRIN 81 MG PO TBEC
81.0000 mg | DELAYED_RELEASE_TABLET | Freq: Every day | ORAL | Status: DC
  Administered 2022-03-26 – 2022-04-01 (×7): 81 mg via ORAL
  Filled 2022-03-26 (×7): qty 1

## 2022-03-26 MED ORDER — TAMSULOSIN HCL 0.4 MG PO CAPS
0.4000 mg | ORAL_CAPSULE | Freq: Every day | ORAL | Status: DC
  Administered 2022-03-26 – 2022-03-31 (×6): 0.4 mg via ORAL
  Filled 2022-03-26 (×6): qty 1

## 2022-03-26 MED ORDER — CLOPIDOGREL BISULFATE 75 MG PO TABS
75.0000 mg | ORAL_TABLET | Freq: Every day | ORAL | Status: DC
  Administered 2022-03-26 – 2022-04-01 (×7): 75 mg via ORAL
  Filled 2022-03-26 (×7): qty 1

## 2022-03-26 MED ORDER — FINASTERIDE 5 MG PO TABS
5.0000 mg | ORAL_TABLET | Freq: Every day | ORAL | Status: DC
  Administered 2022-03-27 – 2022-04-01 (×6): 5 mg via ORAL
  Filled 2022-03-26 (×6): qty 1

## 2022-03-26 MED ORDER — ENOXAPARIN SODIUM 40 MG/0.4ML IJ SOSY
40.0000 mg | PREFILLED_SYRINGE | INTRAMUSCULAR | Status: DC
  Administered 2022-03-26 – 2022-03-31 (×6): 40 mg via SUBCUTANEOUS
  Filled 2022-03-26 (×6): qty 0.4

## 2022-03-26 MED ORDER — PANTOPRAZOLE SODIUM 40 MG PO TBEC
40.0000 mg | DELAYED_RELEASE_TABLET | Freq: Every day | ORAL | Status: DC
  Administered 2022-03-26 – 2022-03-31 (×6): 40 mg via ORAL
  Filled 2022-03-26 (×6): qty 1

## 2022-03-26 NOTE — Progress Notes (Signed)
Palliative: Kevin Bradshaw is sitting up in the [place] chair in his room.  He appears acutely/chronically ill and somewhat frail.  He is alert and oriented x 3, able to make his needs known.  His daughter, Kevin Bradshaw, is present at bedside.  We talk about his acute stroke and the treatment plan.  We talked follow-up with cardiology for treatment options.  We talk about his need for rehab versus going home with home health.  Kevin Bradshaw states that he would prefer to stay in his daughter's home as long as possible but understands that there may be limitations.  At this point Kevin Bradshaw states that her desire is for her father to going to long-term care.  We briefly talked about some logistics related to long-term care such as payer source.  I share that our transition of care team is knowledgeable about placement and can assist her by answering questions.   We also talk about the benefits of outpatient palliative services and hospice care.  We talk about the benefits of at home "treat the treatable" hospice care.  I encouraged patient and family to work closely with social workers and their trusted PCP for services.  At this point they are considering options.  Conference with attending, bedside nursing staff and transition of care team related to patient condition, needs, goals of care, disposition.  Plan: Continue to treat the treatable but no CPR or intubation.  PT recommends home with home health.  Daughter is considering long-term care placement. DNR/[facility] form completed and placed on chart.  39 minutes  Quinn Axe, NP Palliative medicine team Team phone [phone] And 50% of this time was spent counseling and coordinating care related to the above assessment and plan.

## 2022-03-26 NOTE — Progress Notes (Signed)
PROGRESS NOTE    Kevin Bradshaw  [id number] DOB: August 22, 1935 DOA: 03/24/2022  PCP: [name], MD    Brief Narrative: This 87 years old male with PMH significant for A-fib not on anticoagulation secondary to recurrent falls, hyperlipidemia, hypertension, Parkinson disease, history of CVA in 2019 who presented in the ED with acute onset of severe dysarthria, left hemineglect and left sensory deficit secondary to acute right parietal infarct.  He was admitted in the ICU s/p TNK, tolerated well. Etiology favored to be embolic given patient presented with A-fib, has a history of known paroxysmal A-fib but not on anticoagulation due to frequent falls.  He was seen by cardiology who plans to discuss with family whether he might be a candidate for short-term trial of anticoagulation with close follow-up with EP for consideration of watchman's device.  Patient underwent repeat MRI which did not show any increase in the size of infarct or hemorrhage.  Patient is started on aspirin and Plavix. TRH pickup 03/26/2022.  Palliative care consulted, goals of care discussed.  CODE STATUS changed to DNR.  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  S/p TNKase Etiology likely to be embolic secondary to paroxysmal A-fib and patient is not on anticoagulation. Neurology consulted, repeat MRI showed no hemorrhage. Patient is started on aspirin and Plavix. Follow-up echocardiogram. Continue  neurochecks and NIHSS every shift. SCD for VTE prophylaxis. Repeat CT head for any change in neurological exam.  Persistent atrial fibrillation: Heart rate is well-controlled. Cardiology is consulted. Cardio recommended to continue metoprolol, obtain echocardiogram. Cardiology plans to discuss with family whether he might be a candidate for short-term trial of anticoagulation with close follow-up with PCP for consideration for watchman's device.  Recurrent falls: PT and OT eval. Continue fall precautions  BPH: Patient denies any LUTS symptoms. Continue Proscar and Flomax.  Parkinson's disease: Patient not on any anti-Parkinson medications.  Goals of care discussion: Palliative care consulted.  Goals of care discussed.   Family and patient has decided DNR.  DVT prophylaxis: SCDs Code Status:DNR Family Communication: No family at bed side Disposition Plan:   Admitted for acute CVA requiring TNKase, tolerated well.  PT and OT recommended home health services.  Consultants:  Neurology  Procedures: MRI Brain  Antimicrobials: None  Subjective: Patient was seen and examined at bedside.  Overnight events noted.   Patient reports doing better.  He is s/p TNKase for acute stroke.   He is able to move all extremities.  Objective: Vitals:   03/26/22 0500 03/26/22 0808 03/26/22 0942 03/26/22 1145  BP: (!) 141/110 (!) 159/85 139/87 113/76  Pulse: 79 89 79 91  Resp: 20 18  18   Temp: 98.3 F (36.8 C) 98.1 F (36.7 C)  98.2 F (36.8 C)  TempSrc: Oral Oral  Oral  SpO2: 98% 96%  98%  Weight:      Height:        Intake/Output Summary (Last 24 hours) at 03/26/2022 1158 Last data filed at 03/26/2022 0811 Gross per  24 hour  Intake 840 ml  Output 2400 ml  Net -1560 ml   Filed Weights   03/24/22 1809 03/24/22 2013  Weight: 72.6 kg 69.9 kg    Examination:  General exam: Appears calm and comfortable, deconditioned, not in any acute distress. Respiratory system: CTA  bilaterally, respiratory effort normal, RR 15. Cardiovascular system: S1 & S2 heard, irregular rhythm, no murmur. Gastrointestinal system: Abdomen is soft, nontender, nondistended, BS+ Central nervous system: Alert and oriented x 3. No focal neurological deficits. Extremities: No edema, no cyanosis, no clubbing. Skin: No rashes, lesions or ulcers Psychiatry: Judgement and insight appear normal. Mood & affect appropriate.     Data Reviewed: I have personally reviewed following labs and imaging studies  CBC: Recent Labs  Lab 03/24/22 1229 03/25/22 0418 03/26/22 0448  WBC 4.2 6.5 4.7  NEUTROABS 2.2  --   --   HGB 12.9* 14.3 12.9*  HCT 40.5 45.0 40.6  MCV 94.8 93.8 92.9  PLT 152 170 [id number]   Basic Metabolic Panel: Recent Labs  Lab 03/24/22 1229 03/25/22 0418 03/26/22 0448  NA 139 142 140  K 3.7 3.8 3.8  CL 106 107 107  CO2 26 27 27   GLUCOSE 143* 86 103*  BUN 12 10 12   CREATININE 1.14 0.99 0.96  CALCIUM 8.6* 9.0 8.6*  MG  --  2.1 1.9  PHOS  --  3.2 3.2   GFR: Estimated Creatinine Clearance: 49.2 mL/min (by C-G formula based on SCr of 0.96 mg/dL). Liver Function Tests: Recent Labs  Lab 03/24/22 1229 03/26/22 0448  AST 28  --   ALT 19  --   ALKPHOS 88  --   BILITOT 0.8  --   PROT 6.7  --   ALBUMIN 3.3* 3.1*   No results for input(s): "LIPASE", "AMYLASE" in the last 168 hours. No results for input(s): "AMMONIA" in the last 168 hours. Coagulation Profile: Recent Labs  Lab 03/24/22 1229  INR 1.0   Cardiac Enzymes: No results for input(s): "CKTOTAL", "CKMB", "CKMBINDEX", "TROPONINI" in the last 168 hours. BNP (last 3 results) No results for input(s): "PROBNP" in the last 8760 hours. HbA1C: Recent Labs    03/24/22 1229  HGBA1C 6.1*   CBG: Recent Labs  Lab 03/25/22 1917 03/26/22 0037 03/26/22 0503 03/26/22 0808 03/26/22 1143  GLUCAP 141* 129* 100* 98 121*   Lipid Profile: Recent Labs    03/25/22 0418  CHOL 160  HDL 68  LDLCALC 77  TRIG 76   CHOLHDL 2.4   Thyroid Function Tests: Recent Labs    03/25/22 0418  TSH 2.417  FREET4 0.97   Anemia Panel: No results for input(s): "VITAMINB12", "FOLATE", "FERRITIN", "TIBC", "IRON", "RETICCTPCT" in the last 72 hours. Sepsis Labs: No results for input(s): "PROCALCITON", "LATICACIDVEN" in the last 168 hours.  Recent Results (from the past 240 hour(s))  MRSA Next Gen by PCR, Nasal     Status: Abnormal   Collection Time: 03/24/22  8:16 PM   Specimen: Nasal Mucosa; Nasal Swab  Result Value Ref Range Status   MRSA by PCR Next Gen DETECTED (A) NOT DETECTED Final    Comment: RESULT CALLED TO, READ BACK BY AND VERIFIED WITH: [facility] MOORE 03/24/22 2208 MU (NOTE) The GeneXpert MRSA Assay (FDA approved for NASAL specimens only), is one component of a comprehensive MRSA colonization surveillance program. It is not intended to diagnose MRSA infection nor to guide or monitor treatment for MRSA infections. Test performance is not FDA approved in patients less than 2  years old. Performed at [facility] [address] [place], [place] [address]          Radiology Studies: MR BRAIN WO CONTRAST  Result Date: 03/25/2022 CLINICAL DATA:  Stroke, follow up. EXAM: MRI HEAD WITHOUT CONTRAST TECHNIQUE: Multiplanar, multiecho pulse sequences of the brain and surrounding structures were obtained without intravenous contrast. COMPARISON:  Head CT 03/24/2022 and MRI 08/21/2017 FINDINGS: The study is moderately motion degraded. Brain: There is a small acute cortical infarct posteriorly in the right frontal lobe. A right parieto-occipital infarct is chronic. There is also a chronic lacunar infarct in the posterior left lentiform nucleus. There is advanced central predominant cerebral atrophy. T2 hyperintensities in the periventricular white matter bilaterally are nonspecific but compatible with mild chronic small vessel ischemic disease. No intracranial hemorrhage, mass, midline shift, or  extra-axial fluid collection is identified. Vascular: Major intracranial vascular flow voids are preserved. Skull and upper cervical spine: Unremarkable bone marrow signal. Sinuses/Orbits: Unremarkable orbits. Mild left ethmoid sinus mucosal thickening. Clear mastoid air cells. Other: None. IMPRESSION: 1. Small acute right frontal infarct. 2. Chronic right parieto-occipital and left basal ganglia infarcts. 3. Mild chronic small vessel ischemic disease and advanced cerebral atrophy. Electronically Signed   By: Logan Bores M.D.   On: 03/25/2022 18:45   ECHOCARDIOGRAM COMPLETE  Result Date: 03/25/2022    ECHOCARDIOGRAM REPORT   Patient Name:   RAJKUMAR DUCA Date of Exam: 03/25/2022 Medical Rec #:  [id number]       Height:       62.0 in Accession #:    [id number]      Weight:       154.1 lb Date of Birth:  1935-04-21        BSA:          65.711 m Patient Age:    28 years        BP:           119/97 mmHg Patient Gender: M               HR:           88 bpm. Exam Location:  [facility] Procedure: 2D Echo, Color Doppler and Cardiac Doppler Indications:     Stroke I63.9  History:         Patient has prior history of Echocardiogram examinations, most                  recent 10/03/2018. Stroke; Risk Factors:Hypertension and                  Dyslipidemia.  Sonographer:     Sherrie Sport Referring Phys:  [id number] [place] Diagnosing Phys: Ida Rogue MD  Sonographer Comments: Image quality was good. IMPRESSIONS  1. Left ventricular ejection fraction, by estimation, is 60 to 65%. The left ventricle has normal function. The left ventricle has no regional wall motion abnormalities. There is moderate left ventricular hypertrophy. Left ventricular diastolic parameters are indeterminate.  2. Right ventricular systolic function is normal. The right ventricular size is normal.  3. The mitral valve is normal in structure. Mild mitral valve regurgitation. No evidence of mitral stenosis.  4. The aortic valve is normal in  structure. There is mild calcification of the aortic valve. Aortic valve regurgitation is not visualized. Aortic valve sclerosis is present, with no evidence of aortic valve stenosis.  5. The inferior vena cava is normal in size with greater than 50% respiratory variability, suggesting right atrial pressure  of 3 mmHg. FINDINGS  Left Ventricle: Left ventricular ejection fraction, by estimation, is 60 to 65%. The left ventricle has normal function. The left ventricle has no regional wall motion abnormalities. The left ventricular internal cavity size was normal in size. There is  moderate left ventricular hypertrophy. Left ventricular diastolic parameters are indeterminate. Right Ventricle: The right ventricular size is normal. No increase in right ventricular wall thickness. Right ventricular systolic function is normal. Left Atrium: Left atrial size was normal in size. Right Atrium: Right atrial size was normal in size. Pericardium: There is no evidence of pericardial effusion. Mitral Valve: The mitral valve is normal in structure. Mild mitral valve regurgitation. No evidence of mitral valve stenosis. Tricuspid Valve: The tricuspid valve is normal in structure. Tricuspid valve regurgitation is mild . No evidence of tricuspid stenosis. Aortic Valve: The aortic valve is normal in structure. There is mild calcification of the aortic valve. Aortic valve regurgitation is not visualized. Aortic valve sclerosis is present, with no evidence of aortic valve stenosis. Aortic valve mean gradient  measures 4.0 mmHg. Aortic valve peak gradient measures 6.9 mmHg. Aortic valve area, by VTI measures 2.16 cm. Pulmonic Valve: The pulmonic valve was normal in structure. Pulmonic valve regurgitation is not visualized. No evidence of pulmonic stenosis. Aorta: The aortic root is normal in size and structure. Venous: The inferior vena cava is normal in size with greater than 50% respiratory variability, suggesting right atrial pressure  of 3 mmHg. IAS/Shunts: No atrial level shunt detected by color flow Doppler.  LEFT VENTRICLE PLAX 2D LVIDd:         3.20 cm LVIDs:         2.00 cm LV PW:         1.40 cm LV IVS:        1.60 cm LVOT diam:     2.00 cm LV SV:         50 LV SV Index:   29 LVOT Area:     3.14 cm  RIGHT VENTRICLE RV Basal diam:  2.80 cm RV Mid diam:    1.80 cm RV S prime:     14.40 cm/s TAPSE (M-mode): 2.0 cm LEFT ATRIUM             Index        RIGHT ATRIUM           Index LA diam:        3.60 cm 2.10 cm/m   RA Area:     17.00 cm LA Vol (A2C):   39.5 ml 23.08 ml/m  RA Volume:   39.80 ml  23.26 ml/m LA Vol (A4C):   45.5 ml 26.59 ml/m LA Biplane Vol: 44.1 ml 25.77 ml/m  AORTIC VALVE AV Area (Vmax):    1.95 cm AV Area (Vmean):   2.08 cm AV Area (VTI):     2.16 cm AV Vmax:           131.00 cm/s AV Vmean:          92.400 cm/s AV VTI:            0.231 m AV Peak Grad:      6.9 mmHg AV Mean Grad:      4.0 mmHg LVOT Vmax:         81.40 cm/s LVOT Vmean:        61.100 cm/s LVOT VTI:          0.159 m LVOT/AV VTI ratio: 0.69  AORTA Ao  Root diam: 3.00 cm MITRAL VALVE                TRICUSPID VALVE MV Area (PHT): 6.54 cm     TR Peak grad:   20.2 mmHg MV Decel Time: 116 msec     TR Vmax:        225.00 cm/s MV E velocity: 130.00 cm/s                             SHUNTS                             Systemic VTI:  0.16 m                             Systemic Diam: 2.00 cm Ida Rogue MD Electronically signed by Ida Rogue MD Signature Date/Time: 03/25/2022/12:18:35 PM    Final    CT Head Wo Contrast  Result Date: 03/24/2022 CLINICAL DATA:  S/p TNKase.  Now with mental status change. EXAM: CT HEAD WITHOUT CONTRAST TECHNIQUE: Contiguous axial images were obtained from the base of the skull through the vertex without intravenous contrast. RADIATION DOSE REDUCTION: This exam was performed according to the departmental dose-optimization program which includes automated exposure control, adjustment of the mA and/or kV according to patient size  and/or use of iterative reconstruction technique. COMPARISON:  Same day CT head and CTA head/neck FINDINGS: Brain: Unchanged appearance of the region infarction in the right parietal lobe. Slightly hyperdense appearance of the sulci in this region is favored to represent contrast staining given recent administration iodinated contrast material. No evidence of hemorrhage. Unchanged size and shape of the ventricular system. Vascular: No hyperdense vessel or unexpected calcification. Skull: Normal. Negative for fracture or focal lesion. Unchanged subcutaneous soft tissues suboccipital hematoma. Sinuses/Orbits: No mastoid or middle ear effusion. Paranasal sinuses are clear. Other: None. IMPRESSION: Unchanged appearance of the region of infarction in the right parietal lobe. Slightly hyperdense appearance of the sulci in this region of infarction is favored to represent contrast staining given recent administration iodinated contrast material. No evidence of hemorrhage. Electronically Signed   By: Marin Roberts M.D.   On: 03/24/2022 18:59   CT ANGIO HEAD NECK W WO CM W PERF (CODE STROKE)  Addendum Date: 03/24/2022   ADDENDUM REPORT: 03/24/2022 18:44 ADDENDUM: CT brain perfusion was subsequently performed. Automated post processing demonstrates small, matched perfusion defects in the right frontal operculum and inferior parietal lobe, consistent with small areas of infarction without surrounding ischemic penumbra. Electronically Signed   By: Emmit Alexanders M.D.   On: 03/24/2022 18:44   Result Date: 03/24/2022 CLINICAL DATA:  Neuro deficit, acute, stroke suspected. Right parietal infarct on CT. EXAM: CT ANGIOGRAPHY HEAD AND NECK TECHNIQUE: Multidetector CT imaging of the head and neck was performed using the standard protocol during bolus administration of intravenous contrast. Multiplanar CT image reconstructions and MIPs were obtained to evaluate the vascular anatomy. Carotid stenosis measurements (when  applicable) are obtained utilizing NASCET criteria, using the distal internal carotid diameter as the denominator. RADIATION DOSE REDUCTION: This exam was performed according to the departmental dose-optimization program which includes automated exposure control, adjustment of the mA and/or kV according to patient size and/or use of iterative reconstruction technique. CONTRAST:  175mL OMNIPAQUE IOHEXOL 350 MG/ML SOLN COMPARISON:  Head CT 03/24/2022. FINDINGS: CTA NECK FINDINGS Aortic arch:  Three-vessel arch configuration. Atherosclerotic calcifications of the aortic arch and arch vessel origins. Arch vessel origins are patent. Right carotid system: Mixed plaque of the right carotid bulb with less than 50% stenosis of the proximal right cervical ICA. Left carotid system: Predominantly calcified plaque of the left carotid bulb without stenosis. Vertebral arteries: Patent from the origin to the confluence with the basilar without stenosis or dissection. Skeleton: Severe degenerative changes of the cervical spine with multilevel mild-to-moderate stenosis. Other neck: Hematoma in the suboccipital dorsal soft tissues. Upper chest: Unremarkable. Review of the MIP images confirms the above findings CTA HEAD FINDINGS Anterior circulation: Calcified plaque along the carotid siphons without hemodynamically significant stenosis. The proximal ACAs and MCAs are patent without stenosis or aneurysm. Distal branches are symmetric. Posterior circulation: Normal basilar artery. The SCAs, AICAs and PICAs are patent proximally. Severe stenosis of the left P1 segment with left posterior communicating artery contributing to the left P2 opacification. Distal branches are symmetric. Venous sinuses: As permitted by contrast timing, patent. Anatomic variants: None. Review of the MIP images confirms the above findings IMPRESSION: 1. No large vessel occlusion. 2. 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 3. Less than 50% stenosis of the proximal right cervical ICA secondary to mixed plaque. 4. Hematoma in the suboccipital dorsal soft tissues. 5. Aortic Atherosclerosis (ICD10-I70.0). Electronically Signed: By: Emmit Alexanders M.D. On: 03/24/2022 18:17   CT HEAD CODE STROKE WO CONTRAST  Result Date: 03/24/2022 CLINICAL DATA:  Code stroke.  Left-sided deficits EXAM: CT HEAD WITHOUT CONTRAST TECHNIQUE: Contiguous axial images were obtained from the base of the skull through the vertex without intravenous contrast. RADIATION DOSE REDUCTION: This exam was performed according to the departmental dose-optimization program which includes automated exposure control, adjustment of the mA and/or kV according to patient size and/or use of iterative reconstruction technique. COMPARISON:  CT head 10/02/18 FINDINGS: Brain: Acute to subacute right parietal lobe infarct. No hemorrhage. Advanced generalized volume loss. Sequela of moderate chronic microvascular ischemic change. Chronic infarct in the left lentiform nucleus. Ventriculomegaly that is proportional to the degree of volume loss. No extra-axial fluid collection. Vascular: No hyperdense vessel or unexpected calcification. Skull: Normal. Negative for fracture or focal lesion. Sinuses/Orbits: No middle ear or mastoid effusion. Paranasal sinuses are clear. Orbits are unremarkable. Other: None. ASPECTS [facility] Stroke Program Early CT Score) - Ganglionic level infarction (caudate, lentiform nuclei, internal capsule, insula, M1-M3 cortex): 6 - Supraganglionic infarction (M4-M6 cortex): 2 Total score (0-10 with 10 being normal): 8 IMPRESSION: 1. Acute right MCA territory predominantly involving the parietal lobe infarct. No hemorrhage. 2. Aspects is 8. Findings were paged to Dr. Quinn Axe via [place] paging system on 03/24/22 at 5:36 PM. Electronically Signed   By: Marin Roberts M.D.   On: 03/24/2022 17:42    Scheduled Meds:   aspirin EC  81 mg Oral Daily   Chlorhexidine Gluconate Cloth  6 each Topical Daily   clopidogrel  75 mg Oral Daily   diphenhydrAMINE  25 mg Intravenous Once   enoxaparin (LOVENOX) injection  40 mg Subcutaneous Q24H   feeding supplement  237 mL Oral TID BM   metoprolol succinate  25 mg Oral Daily   multivitamin with minerals  1 tablet Oral Daily   mupirocin ointment  1 Application Nasal BID   pantoprazole  40 mg Oral QHS   Continuous Infusions:   LOS: 2 days    Time spent: 50 mins  Duard Brady, MD [facility]   If 7PM-7AM, please contact night-coverage

## 2022-03-26 NOTE — Progress Notes (Signed)
PHARMACIST - PHYSICIAN COMMUNICATION  CONCERNING: IV to Oral Route Change Policy  RECOMMENDATION: This patient is receiving pantoprazole by the intravenous route.  Based on criteria approved by the Pharmacy and Therapeutics Committee, the intravenous medication(s) is/are being converted to the equivalent oral dose form(s).  DESCRIPTION: These criteria include: The patient is eating (either orally or via tube) and/or has been taking other orally administered medications for a least 24 hours The patient has no evidence of active gastrointestinal bleeding or impaired GI absorption (gastrectomy, short bowel, patient on TNA or NPO).  If you have questions about this conversion, please contact the [place] 03/26/2022 11:49 AM

## 2022-03-26 NOTE — Evaluation (Signed)
Occupational Therapy Evaluation Patient Details Name: Kevin Bradshaw MRN: [id number] DOB: [birth year] Today's Date: 03/26/2022   History of Present Illness his is a 87 yo man with hx a fib not on anticoagulation 2/2 frequent falls, HL, HTN, Parkinson disease, stroke 2019 who presents with acute onset severe dysarthria, L hemineglect, and L sensory deficit. NIHSS = 7. CT head showed acute R parietal infarct ASPECTS 8. Patient was mildly confused and had difficulty following simple commands. Patient is s/p TNK   Clinical Impression   Patient presenting with decreased Ind in self care,balance, functional mobility/transfers, endurance, and strength. Patient reports living at home with daughter and son in law. They assist him with IADLs. Pt endorses being able to perform self care tasks from wheelchair level during the day to decrease fall risk. Pt can ambulate short distance with RW.  Patient currently functioning at min A overall for self care and functional mobility/transfers with RW. Pt endorses feeling close to his baseline.  Patient will benefit from acute OT to increase overall independence in the areas of ADLs, functional mobility, and safety awareness in order to safely discharge home with supportive family.      Recommendations for follow up therapy are one component of a multi-disciplinary discharge planning process, led by the attending physician.  Recommendations may be updated based on patient status, additional functional criteria and insurance authorization.   Follow Up Recommendations  No OT follow up     Assistance Recommended at Discharge Intermittent Supervision/Assistance  Patient can return home with the following A little help with walking and/or transfers;A little help with bathing/dressing/bathroom;Help with stairs or ramp for entrance;Assist for transportation;Assistance with cooking/housework    Functional Status Assessment  Patient has had a recent decline in their  functional status and demonstrates the ability to make significant improvements in function in a reasonable and predictable amount of time.  Equipment Recommendations  BSC/3in1       Precautions / Restrictions Precautions Precautions: Fall Precaution Comments: frequent falls at home, uses w/c a lot Restrictions Weight Bearing Restrictions: No      Mobility Bed Mobility Overal bed mobility: Needs Assistance Bed Mobility: Supine to Sit     Supine to sit: Mod assist, HOB elevated          Transfers Overall transfer level: Needs assistance Equipment used: Rolling walker (2 wheels) Transfers: Sit to/from Stand Sit to Stand: Min assist                  Balance Overall balance assessment: History of Falls, Needs assistance Sitting-balance support: Feet supported Sitting balance-Leahy Scale: Fair     Standing balance support: Bilateral upper extremity supported, During functional activity, Reliant on assistive device for balance Standing balance-Leahy Scale: Poor                             ADL either performed or assessed with clinical judgement   ADL Overall ADL's : Needs assistance/impaired                     Lower Body Dressing: Minimal assistance;Sitting/lateral leans Lower Body Dressing Details (indicate cue type and reason): to don B socks. He was able to utilize figure four position but leaning forward to then don sock was difficult. He usually does from reported lower surface when seated in wheelchair. Toilet Transfer: Minimal assistance;Rolling walker (2 wheels);Ambulation Toilet Transfer Details (indicate cue type and reason): simulated  Vision Patient Visual Report: No change from baseline              Pertinent Vitals/Pain Pain Assessment Pain Assessment: No/denies pain     Hand Dominance Right   Extremity/Trunk Assessment Upper Extremity Assessment Upper Extremity Assessment: Overall WFL for tasks  assessed   Lower Extremity Assessment Lower Extremity Assessment: Overall WFL for tasks assessed   Cervical / Trunk Assessment Cervical / Trunk Assessment: Normal   Communication Communication Communication: No difficulties   Cognition Arousal/Alertness: Awake/alert Behavior During Therapy: WFL for tasks assessed/performed Overall Cognitive Status: Within Functional Limits for tasks assessed                                 General Comments: A & O x 3                Home Living Family/patient expects to be discharged to:: Private residence Living Arrangements: Children Available Help at Discharge: Family;Available PRN/intermittently Type of Home: House Home Access: Stairs to enter [employer] of Steps: 2-3 Entrance Stairs-Rails: None Home Layout: One level     Bathroom Shower/Tub: Teacher, early years/pre: Standard Bathroom Accessibility: Yes   Home Equipment: Conservation officer, nature (2 wheels);Wheelchair - manual          Prior Functioning/Environment Prior Level of Function : Needs assist;History of Falls (last six months)             Mobility Comments: patient uses wheelchair, but is able to walk short distances with RW ADLs Comments: Pt reports sink bathing from wheelchair level. Family assists with IADLs        OT Problem List: Decreased strength;Decreased activity tolerance;Decreased safety awareness;Impaired balance (sitting and/or standing);Decreased knowledge of use of DME or AE      OT Treatment/Interventions: Self-care/ADL training;Therapeutic exercise;Therapeutic activities;Energy conservation;DME and/or AE instruction;Patient/family education;Manual therapy;Balance training    OT Goals(Current goals can be found in the care plan section) Acute Rehab OT Goals Patient Stated Goal: to return home OT Goal Formulation: With patient Time For Goal Achievement: 04/09/22 Potential to Achieve Goals: Good ADL Goals Pt Will  Perform Grooming: with modified independence Pt Will Perform Lower Body Dressing: with supervision;sit to/from stand Pt Will Transfer to Toilet: with supervision;stand pivot transfer;ambulating Pt Will Perform Toileting - Clothing Manipulation and hygiene: with supervision;sit to/from stand  OT Frequency: Min 2X/week       AM-PAC OT "6 Clicks" Daily Activity     Outcome Measure Help from another person eating meals?: None Help from another person taking care of personal grooming?: A Little Help from another person toileting, which includes using toliet, bedpan, or urinal?: A Little Help from another person bathing (including washing, rinsing, drying)?: A Little Help from another person to put on and taking off regular upper body clothing?: A Little Help from another person to put on and taking off regular lower body clothing?: A Little 6 Click Score: 19   End of Session Equipment Utilized During Treatment: Rolling walker (2 wheels) Nurse Communication: Mobility status  Activity Tolerance: Patient tolerated treatment well Patient left: in bed;with call bell/phone within reach;with bed alarm set  OT Visit Diagnosis: Unsteadiness on feet (R26.81);Repeated falls (R29.6);Muscle weakness (generalized) (M62.81)                Time: [id number] OT Time Calculation (min): 10 min Charges:  OT General Charges $OT Visit: 1 Visit OT Evaluation $OT Eval Moderate Complexity:  [place], MS, OTR/L , CBIS ascom [phone]  03/26/22, 11:37 AM

## 2022-03-26 NOTE — Evaluation (Addendum)
Physical Therapy Evaluation Patient Details Name: Kevin Bradshaw MRN: [id number] DOB: 10-10-1935 Today's Date: 03/26/2022  History of Present Illness  his is a 87 yo man with hx a fib not on anticoagulation 2/2 frequent falls, HL, HTN, Parkinson disease, stroke 2019 who presents with acute onset severe dysarthria, L hemineglect, and L sensory deficit. NIHSS = 7. CT head showed acute R parietal infarct ASPECTS 8. Patient was mildly confused and had difficulty following simple commands. Patient is s/p TNK  Clinical Impression  Patient received in bed. Speech seems clear. Patient is alert and oriented to place, time, self. He required mod assist for supine to sit. Min A for sit to stand and min A with RW to ambulate short distance in room. Patient is at increased fall risk due to unsteadiness on feet. He is probably close to his baseline mobility. He will continue to benefit from skilled PT to improve mobility and safety.         Recommendations for follow up therapy are one component of a multi-disciplinary discharge planning process, led by the attending physician.  Recommendations may be updated based on patient status, additional functional criteria and insurance authorization.  Follow Up Recommendations Home health PT      Assistance Recommended at Discharge Frequent or constant Supervision/Assistance  Patient can return home with the following  A little help with walking and/or transfers;A little help with bathing/dressing/bathroom;Help with stairs or ramp for entrance;Assist for transportation;Assistance with cooking/housework;Direct supervision/assist for medications management    Equipment Recommendations None recommended by PT  Recommendations for Other Services       Functional Status Assessment Patient has had a recent decline in their functional status and demonstrates the ability to make significant improvements in function in a reasonable and predictable amount of time.      Precautions / Restrictions Precautions Precautions: Fall Precaution Comments: frequent falls at home, uses w/c a lot Restrictions Weight Bearing Restrictions: No      Mobility  Bed Mobility Overal bed mobility: Needs Assistance Bed Mobility: Supine to Sit     Supine to sit: Mod assist, HOB elevated          Transfers Overall transfer level: Needs assistance Equipment used: Rolling walker (2 wheels) Transfers: Sit to/from Stand Sit to Stand: Min assist                Ambulation/Gait Ambulation/Gait assistance: Min Web designer (Feet): 5 Feet Assistive device: Rolling walker (2 wheels) Gait Pattern/deviations: Step-through pattern, Decreased step length - right, Decreased step length - left, Shuffle Gait velocity: decr     General Gait Details: short, shuffle steps with lots of motion in feet. Decreased stability and history of frequent falls.  Stairs            Wheelchair Mobility    Modified Rankin (Stroke Patients Only)       Balance Overall balance assessment: History of Falls, Needs assistance Sitting-balance support: Feet supported Sitting balance-Leahy Scale: Fair     Standing balance support: Bilateral upper extremity supported, During functional activity, Reliant on assistive device for balance Standing balance-Leahy Scale: Poor                               Pertinent Vitals/Pain Pain Assessment Pain Assessment: No/denies pain    Home Living Family/patient expects to be discharged to:: Private residence Living Arrangements: Children Available Help at Discharge: Family;Available PRN/intermittently Type of Home: [place]  Access: Stairs to enter Entrance Stairs-Rails: None Entrance Stairs-Number of Steps: 2-3   Home Layout: One level Home Equipment: Conservation officer, nature (2 wheels);Wheelchair - manual      Prior Function Prior Level of Function : Needs assist;History of Falls (last six months)              Mobility Comments: patient uses wheelchair, but is able to walk short distances with RW ADLs Comments: needs assistance     Hand Dominance   Dominant Hand: Right    Extremity/Trunk Assessment   Upper Extremity Assessment Upper Extremity Assessment: Overall WFL for tasks assessed    Lower Extremity Assessment Lower Extremity Assessment: Overall WFL for tasks assessed    Cervical / Trunk Assessment Cervical / Trunk Assessment: Normal  Communication   Communication: No difficulties  Cognition Arousal/Alertness: Awake/alert Behavior During Therapy: WFL for tasks assessed/performed Overall Cognitive Status: Within Functional Limits for tasks assessed                                 General Comments: A & O x 3        General Comments      Exercises     Assessment/Plan    PT Assessment Patient needs continued PT services  PT Problem List Decreased activity tolerance;Decreased balance;Decreased mobility       PT Treatment Interventions DME instruction;Therapeutic exercise;Gait training;Stair training;Functional mobility training;Therapeutic activities;Patient/family education;Neuromuscular re-education;Balance training;Cognitive remediation    PT Goals (Current goals can be found in the Care Plan section)  Acute Rehab PT Goals Patient Stated Goal: to return home PT Goal Formulation: With patient Time For Goal Achievement: 04/09/22 Potential to Achieve Goals: Good    Frequency 7X/week     Co-evaluation               AM-PAC PT "6 Clicks" Mobility  Outcome Measure Help needed turning from your back to your side while in a flat bed without using bedrails?: A Lot Help needed moving from lying on your back to sitting on the side of a flat bed without using bedrails?: A Lot Help needed moving to and from a bed to a chair (including a wheelchair)?: A Little Help needed standing up from a chair using your arms (e.g., wheelchair or bedside chair)?:  A Little Help needed to walk in hospital room?: A Little Help needed climbing 3-5 steps with a railing? : A Lot 6 Click Score: 15    End of Session   Activity Tolerance: Patient tolerated treatment well Patient left: in chair;with call bell/phone within reach;with chair alarm set Nurse Communication: Mobility status PT Visit Diagnosis: Unsteadiness on feet (R26.81);Difficulty in walking, not elsewhere classified (R26.2);Repeated falls (R29.6)    Time: [id number] PT Time Calculation (min) (ACUTE ONLY): 14 min   Charges:   PT Evaluation $PT Eval Moderate Complexity: 1 Mod          Lyanna Blystone, PT, GCS 03/26/22,9:42 AM

## 2022-03-26 NOTE — Progress Notes (Signed)
Rounding Note    Patient Name: Kevin Bradshaw Date of Encounter: 03/26/2022  [facility] Cardiologist: Nelva Bush, MD   Subjective   Sitting up in chair, comfortable Daughter at the bedside On discussion concerning home situation, she reports that he has frequent falls, approximately 1/week, sometimes will come in clusters such as 4 times in the day Will skin of his arms, has never hit his head on falls Significant cadence ability, spends much of his time in a wheelchair, uses a walker for short distances but has trouble ambulating back from where he traveled to Remote history of stroke 2019 seemed to be when most of his disability commenced  Inpatient Medications    Inpatient Medications: Scheduled Meds:   stroke: early stages of recovery book   Does not apply Once   Chlorhexidine Gluconate Cloth  6 each Topical Daily   diphenhydrAMINE  25 mg Intravenous Once   mupirocin ointment  1 Application Nasal BID   pantoprazole (PROTONIX) IV  40 mg Intravenous QHS    Continuous Infusions:    Vital Signs    There were no vitals filed for this visit.  Intake/Output Summary (Last 24 hours) at 03/26/2022 1823 Last data filed at 03/26/2022 1636 Gross per 24 hour  Intake 240 ml  Output 2575 ml  Net -2335 ml      03/24/2022    8:13 PM 03/24/2022    6:09 PM 10/19/2018    8:57 AM  Last 3 Weights  Weight (lbs) 154 lb 1.6 oz 160 lb 1.6 oz 152 lb 8 oz  Weight (kg) 69.9 kg 72.621 kg 69.174 kg      Telemetry    Atrial fibrillation rate 80-90- Personally Reviewed  ECG     - Personally Reviewed  Physical Exam   GEN: No acute distress.   Neck: No JVD Cardiac: Irregularly irregular no murmurs, rubs, or gallops.  Respiratory: Clear to auscultation bilaterally. GI: Soft, nontender, non-distended  MS: No edema; No deformity. Neuro:  Nonfocal  Psych: Normal affect   Labs    High Sensitivity Troponin:  No results for input(s): "TROPONINIHS" in the last 720  hours.   Chemistry Recent Labs  Lab 03/24/22 1229 03/25/22 0418 03/26/22 0448  NA 139 142 140  K 3.7 3.8 3.8  CL 106 107 107  CO2 26 27 27   GLUCOSE 143* 86 103*  BUN 12 10 12   CREATININE 1.14 0.99 0.96  CALCIUM 8.6* 9.0 8.6*  MG  --  2.1 1.9  PROT 6.7  --   --   ALBUMIN 3.3*  --  3.1*  AST 28  --   --   ALT 19  --   --   ALKPHOS 88  --   --   BILITOT 0.8  --   --   GFRNONAA >60 >60 >60  ANIONGAP 7 8 6     Lipids  Recent Labs  Lab 03/25/22 0418  CHOL 160  TRIG 76  HDL 68  LDLCALC 77  CHOLHDL 2.4    Hematology Recent Labs  Lab 03/24/22 1229 03/25/22 0418 03/26/22 0448  WBC 4.2 6.5 4.7  RBC 4.27 4.80 4.37  HGB 12.9* 14.3 12.9*  HCT 40.5 45.0 40.6  MCV 94.8 93.8 92.9  MCH 30.2 29.8 29.5  MCHC 31.9 31.8 31.8  RDW 13.9 14.0 14.0  PLT 152 170 150   Thyroid  Recent Labs  Lab 03/25/22 0418  TSH 2.417  FREET4 0.97    BNPNo results for input(s): "  BNP", "PROBNP" in the last 168 hours.  DDimer No results for input(s): "DDIMER" in the last 168 hours.   Radiology    MR BRAIN WO CONTRAST  Result Date: 03/25/2022 CLINICAL DATA:  Stroke, follow up. EXAM: MRI HEAD WITHOUT CONTRAST TECHNIQUE: Multiplanar, multiecho pulse sequences of the brain and surrounding structures were obtained without intravenous contrast. COMPARISON:  Head CT 03/24/2022 and MRI 08/21/2017 FINDINGS: The study is moderately motion degraded. Brain: There is a small acute cortical infarct posteriorly in the right frontal lobe. A right parieto-occipital infarct is chronic. There is also a chronic lacunar infarct in the posterior left lentiform nucleus. There is advanced central predominant cerebral atrophy. T2 hyperintensities in the periventricular white matter bilaterally are nonspecific but compatible with mild chronic small vessel ischemic disease. No intracranial hemorrhage, mass, midline shift, or extra-axial fluid collection is identified. Vascular: Major intracranial vascular flow voids are  preserved. Skull and upper cervical spine: Unremarkable bone marrow signal. Sinuses/Orbits: Unremarkable orbits. Mild left ethmoid sinus mucosal thickening. Clear mastoid air cells. Other: None. IMPRESSION: 1. Small acute right frontal infarct. 2. Chronic right parieto-occipital and left basal ganglia infarcts. 3. Mild chronic small vessel ischemic disease and advanced cerebral atrophy. Electronically Signed   By: Logan Bores M.D.   On: 03/25/2022 18:45   ECHOCARDIOGRAM COMPLETE  Result Date: 03/25/2022    ECHOCARDIOGRAM REPORT   Patient Name:   Kevin Bradshaw Date of Exam: 03/25/2022 Medical Rec #:  [id number]       Height:       62.0 in Accession #:    [id number]      Weight:       154.1 lb Date of Birth:  [birth year]        BSA:          2.711 m Patient Age:    87 years        BP:           119/97 mmHg Patient Gender: M               HR:           88 bpm. Exam Location:  [facility] Procedure: 2D Echo, Color Doppler and Cardiac Doppler Indications:     Stroke I63.9  History:         Patient has prior history of Echocardiogram examinations, most                  recent 10/03/2018. Stroke; Risk Factors:Hypertension and                  Dyslipidemia.  Sonographer:     Sherrie Sport Referring Phys:  [id number] Shambaugh L [place] Diagnosing Phys: Ida Rogue MD  Sonographer Comments: Image quality was good. IMPRESSIONS  1. Left ventricular ejection fraction, by estimation, is 60 to 65%. The left ventricle has normal function. The left ventricle has no regional wall motion abnormalities. There is moderate left ventricular hypertrophy. Left ventricular diastolic parameters are indeterminate.  2. Right ventricular systolic function is normal. The right ventricular size is normal.  3. The mitral valve is normal in structure. Mild mitral valve regurgitation. No evidence of mitral stenosis.  4. The aortic valve is normal in structure. There is mild calcification of the aortic valve. Aortic valve regurgitation is not visualized.  Aortic valve sclerosis is present, with no evidence of aortic valve stenosis.  5. The inferior vena cava is normal in size with greater than 50% respiratory variability, suggesting right atrial pressure  of 3 mmHg. FINDINGS  Left Ventricle: Left ventricular ejection fraction, by estimation, is 60 to 65%. The left ventricle has normal function. The left ventricle has no regional wall motion abnormalities. The left ventricular internal cavity size was normal in size. There is  moderate left ventricular hypertrophy. Left ventricular diastolic parameters are indeterminate. Right Ventricle: The right ventricular size is normal. No increase in right ventricular wall thickness. Right ventricular systolic function is normal. Left Atrium: Left atrial size was normal in size. Right Atrium: Right atrial size was normal in size. Pericardium: There is no evidence of pericardial effusion. Mitral Valve: The mitral valve is normal in structure. Mild mitral valve regurgitation. No evidence of mitral valve stenosis. Tricuspid Valve: The tricuspid valve is normal in structure. Tricuspid valve regurgitation is mild . No evidence of tricuspid stenosis. Aortic Valve: The aortic valve is normal in structure. There is mild calcification of the aortic valve. Aortic valve regurgitation is not visualized. Aortic valve sclerosis is present, with no evidence of aortic valve stenosis. Aortic valve mean gradient  measures 4.0 mmHg. Aortic valve peak gradient measures 6.9 mmHg. Aortic valve area, by VTI measures 2.16 cm. Pulmonic Valve: The pulmonic valve was normal in structure. Pulmonic valve regurgitation is not visualized. No evidence of pulmonic stenosis. Aorta: The aortic root is normal in size and structure. Venous: The inferior vena cava is normal in size with greater than 50% respiratory variability, suggesting right atrial pressure of 3 mmHg. IAS/Shunts: No atrial level shunt detected by color flow Doppler.  LEFT VENTRICLE PLAX 2D LVIDd:          3.20 cm LVIDs:         2.00 cm LV PW:         1.40 cm LV IVS:        1.60 cm LVOT diam:     2.00 cm LV SV:         50 LV SV Index:   29 LVOT Area:     3.14 cm  RIGHT VENTRICLE RV Basal diam:  2.80 cm RV Mid diam:    1.80 cm RV S prime:     14.40 cm/s TAPSE (M-mode): 2.0 cm LEFT ATRIUM             Index        RIGHT ATRIUM           Index LA diam:        3.60 cm 2.10 cm/m   RA Area:     17.00 cm LA Vol (A2C):   39.5 ml 23.08 ml/m  RA Volume:   39.80 ml  23.26 ml/m LA Vol (A4C):   45.5 ml 26.59 ml/m LA Biplane Vol: 44.1 ml 25.77 ml/m  AORTIC VALVE AV Area (Vmax):    1.95 cm AV Area (Vmean):   2.08 cm AV Area (VTI):     2.16 cm AV Vmax:           131.00 cm/s AV Vmean:          92.400 cm/s AV VTI:            0.231 m AV Peak Grad:      6.9 mmHg AV Mean Grad:      4.0 mmHg LVOT Vmax:         81.40 cm/s LVOT Vmean:        61.100 cm/s LVOT VTI:          0.159 m LVOT/AV VTI ratio: 0.69  AORTA Ao  Root diam: 3.00 cm MITRAL VALVE                TRICUSPID VALVE MV Area (PHT): 6.54 cm     TR Peak grad:   20.2 mmHg MV Decel Time: 116 msec     TR Vmax:        225.00 cm/s MV E velocity: 130.00 cm/s                             SHUNTS                             Systemic VTI:  0.16 m                             Systemic Diam: 2.00 cm Ida Rogue MD Electronically signed by Ida Rogue MD Signature Date/Time: 03/25/2022/12:18:35 PM    Final    CT Head Wo Contrast  Result Date: 03/24/2022 CLINICAL DATA:  S/p TNKase.  Now with mental status change. EXAM: CT HEAD WITHOUT CONTRAST TECHNIQUE: Contiguous axial images were obtained from the base of the skull through the vertex without intravenous contrast. RADIATION DOSE REDUCTION: This exam was performed according to the departmental dose-optimization program which includes automated exposure control, adjustment of the mA and/or kV according to patient size and/or use of iterative reconstruction technique. COMPARISON:  Same day CT head and CTA head/neck FINDINGS:  Brain: Unchanged appearance of the region infarction in the right parietal lobe. Slightly hyperdense appearance of the sulci in this region is favored to represent contrast staining given recent administration iodinated contrast material. No evidence of hemorrhage. Unchanged size and shape of the ventricular system. Vascular: No hyperdense vessel or unexpected calcification. Skull: Normal. Negative for fracture or focal lesion. Unchanged subcutaneous soft tissues suboccipital hematoma. Sinuses/Orbits: No mastoid or middle ear effusion. Paranasal sinuses are clear. Other: None. IMPRESSION: Unchanged appearance of the region of infarction in the right parietal lobe. Slightly hyperdense appearance of the sulci in this region of infarction is favored to represent contrast staining given recent administration iodinated contrast material. No evidence of hemorrhage. Electronically Signed   By: Marin Roberts M.D.   On: 03/24/2022 18:59    Cardiac Studies   Echo 1. Left ventricular ejection fraction, by estimation, is 60 to 65%. The  left ventricle has normal function. The left ventricle has no regional  wall motion abnormalities. There is moderate left ventricular hypertrophy.  Left ventricular diastolic  parameters are indeterminate.   2. Right ventricular systolic function is normal. The right ventricular  size is normal.   3. The mitral valve is normal in structure. Mild mitral valve  regurgitation. No evidence of mitral stenosis.   4. The aortic valve is normal in structure. There is mild calcification  of the aortic valve. Aortic valve regurgitation is not visualized. Aortic  valve sclerosis is present, with no evidence of aortic valve stenosis.   5. The inferior vena cava is normal in size with greater than 50%  respiratory variability, suggesting right atrial pressure of 3 mmHg.   Patient Profile     Kevin Bradshaw is a 87 year old gentleman with history of hypertension, hyperlipidemia,  Parkinson disease, frequent falls, paroxysmal atrial fibrillation, prior history of stroke presenting March 25, 2022 for new onset stroke, noted to be in atrial fibrillation on arriva   Assessment & Plan  Persistent atrial fibrillation Prior history of atrial fibrillation 2020 Noted to be in normal sinus rhythm September 2022, no EKG since that time until admission March 13 showing atrial fibrillation Not on anticoagulation secondary to high fall risk Currently not a good candidate for NOAC in the setting of recent stroke -Recommend increasing metoprolol succinate back to 50 daily for rate control On discussion with Kevin Bradshaw and daughter at the bedside concerning various treatment options for his atrial fibrillation, discussed challenges with anticoagulation given frequent falls, debility.  We have recommended follow-up with EP for consideration of Watchman (if he is a candidate) given multiple strokes -They do mention that he would likely be placed in nursing facility though one has not been arranged.  If he is essentially wheelchair-bound, only moving with assistance from staff at the nursing facility and lower risk of falls given supervision, potentially could initiate a Noac   Acute stroke, status post TNK In the setting of atrial fibrillation Right parietal infarct felt to be embolic Post TNK suboccipital hematoma in the dorsal soft tissue Mentating well, feels he is close to his baseline   Parkinson's/history of falls Managed by family at home Prior history of stroke 2019    Total encounter time more than 50 minutes  Greater than 50% was spent in counseling and coordination of care with the patient   For questions or updates, please contact [place] Please consult [email or web address] for contact info under        Signed, Ida Rogue, MD  03/26/2022, 6:23 PM

## 2022-03-26 NOTE — Progress Notes (Signed)
Neurology progress note  S: Patient states that he continunes to feel better, dysarthria and hemineglect are still present but both improved. No new complaints today. MRI brain 24 hrs post TNK showed small acute R frontal infarct with no e/o hemorrhagic conversion.  O:  Vitals:   03/26/22 1145 03/26/22 1631  BP: 113/76 138/88  Pulse: 91 87  Resp: 18 18  Temp: 98.2 F (36.8 C) (!) 97.4 F (36.3 C)  SpO2: 98% 100%   Physical Exam Gen: A&Ox2, some difficulty following simple commands Resp: normal WOB CV: extremities appear well-perfused   Neuro: *MS: A&Ox2, some difficulty following simple commands *Speech: moderate dysarthria, no aphasia *CN: PERRL 57mm, EOMI, VFF by confrontation, sensation intact, smile symmetric, hearing intact to voice *Motor:   Normal bulk.  No tremor, rigidity or bradykinesia. No drift in any extremity. Slight proximal weakness of LUE relative to R. *Sensory: L sensory deficit, Extinction to DSS *Coordination:  FNF bilat with dysmetria but no frank ataxia *Reflexes:  2+ symm throughout, toes w/d only bilat *Gait: deferred   NIHSS   1a Level of Conscious.: 0 1b LOC Questions: 2 1c LOC Commands: 1 2 Best Gaze: 0 3 Visual: 0 4 Facial Palsy: 0 5a Motor Arm - left: 0 5b Motor Arm - Right: 0 6a Motor Leg - Left: 0 6b Motor Leg - Right: 0 7 Limb Ataxia: 0 8 Sensory: 1 9 Best Language: 0 10 Dysarthria: 1 11 Extinct. and Inatten.: 1   TOTAL: 6  Data  CT head  1. Acute right MCA territory predominantly involving the parietal lobe infarct. No hemorrhage. 2. Aspects is 8.  CTA H&N  1. No large vessel occlusion. 2. 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 3. Less than 50% stenosis of the proximal right cervical ICA secondary to mixed plaque. 4. Hematoma in the suboccipital dorsal soft tissues. 5. Aortic Atherosclerosis (ICD10-I70.0).  MRI brain wo  contrast 1. Small acute right frontal infarct. 2. Chronic right parieto-occipital and left basal ganglia infarcts. 3. Mild chronic small vessel ischemic disease and advanced cerebral atrophy.  CNS imaging personally reviewed; I agree with radiology interpretations  TTE - no intracardiac clot  Stroke Labs     Component Value Date/Time   CHOL 160 03/25/2022 0418   TRIG 76 03/25/2022 0418   HDL 68 03/25/2022 0418   CHOLHDL 2.4 03/25/2022 0418   VLDL 15 03/25/2022 0418   LDLCALC 77 03/25/2022 0418    Lab Results  Component Value Date/Time   HGBA1C 6.1 (H) 03/24/2022 12:29 PM    A/P:  This is a 87 yo man with hx a fib not on anticoagulation 2/2 frequent falls, HL, HTN, Parkinson disease, stroke 2019 who presents with acute onset severe dysarthria, L hemineglect, and L sensory deficit 2/2 acute R frontal infarct s/p TNK administered 03/24/22 evening.  Etiology favored to be embolic given patient presented in a fib. He has a known history pAF but was not on anticoagulation PTA 2/2 frequent falls. He was seen by cardiology who plans to discuss with family whether he might be a candidate for short trial of anticoagulation with close f/u with EP for consideration of Watchman device. Anticoagulation trial would be started in clinic outside the acute post-stroke period if it is determined to be appropriate.   MRI brain showed no hemorrhagic conversion of his ischemic infarct therefore will start DAPT and DVT prophylaxis today.  - Resume home aspirin 81mg   daily and plavix 75mg  daily. If anticoagulation is trialed in the future there is no neurologic indication to continue antiplatelets. In the absence of cardiac indication to continue aspirin please d/c antiplatelets if patient is started on anticoagulation. - DVT prophylaxis ordered - SBP goal <180/105 for one more day, f/b gently lowering goal to normotension. Avoid hypotension. - STAT head CT for any change in neurologic exam - Cardiology  to determine whether he might be a candidate for short trial of anticoagulation with close f/u with EP for consideration of Watchman device - PT/OT and speech therapy - stroke education - outpatient f/u with neurology after discharge (I will arrange)  Neurology to be available for questions going forward.  Su Monks, MD [facility] [phone]  If 7pm- 7am, please page neurology on call as listed in [place].

## 2022-03-26 NOTE — Telephone Encounter (Signed)
-----   Message from Ramsey, PA-C sent at 03/26/2022 10:59 AM EDT ----- Regarding: hosp follow-up Pt needs EP follow-up with Qu[facility] Ore in 3-4 weeks for watchman consideration. Thanks!

## 2022-03-27 DIAGNOSIS — I639 Cerebral infarction, unspecified: Secondary | ICD-10-CM | POA: Diagnosis not present

## 2022-03-27 LAB — CBC
HCT: 43.8 % (ref 39.0–52.0)
Hemoglobin: 14 g/dL (ref 13.0–17.0)
MCH: 30 pg (ref 26.0–34.0)
MCHC: 32 g/dL (ref 30.0–36.0)
MCV: 94 fL (ref 80.0–100.0)
Platelets: 158 10*3/uL (ref 150–400)
RBC: 4.66 MIL/uL (ref 4.22–5.81)
RDW: 14 % (ref 11.5–15.5)
WBC: 5.1 10*3/uL (ref 4.0–10.5)
nRBC: 0 % (ref 0.0–0.2)

## 2022-03-27 LAB — RENAL FUNCTION PANEL
Albumin: 3.1 g/dL — ABNORMAL LOW (ref 3.5–5.0)
Anion gap: 7 (ref 5–15)
BUN: 13 mg/dL (ref 8–23)
CO2: 29 mmol/L (ref 22–32)
Calcium: 9.4 mg/dL (ref 8.9–10.3)
Chloride: 105 mmol/L (ref 98–111)
Creatinine, Ser: 0.93 mg/dL (ref 0.61–1.24)
GFR, Estimated: >60 mL/min (ref >60)
Glucose, Bld: 105 mg/dL — ABNORMAL HIGH (ref 70–99)
Phosphorus: 3.6 mg/dL (ref 2.5–4.6)
Potassium: 4.8 mmol/L (ref 3.5–5.1)
Sodium: 141 mmol/L (ref 135–145)

## 2022-03-27 LAB — MAGNESIUM: Magnesium: 1.9 mg/dL (ref 1.7–2.4)

## 2022-03-27 LAB — T3, FREE: T3, Free: 2.7 pg/mL (ref 2.0–4.4)

## 2022-03-27 NOTE — Progress Notes (Signed)
Mobility Specialist - Progress Note    03/27/22 1445  Mobility  Activity Ambulated with assistance in room;Dangled on edge of bed  Level of Assistance Contact guard assist, steadying assist  Assistive Device Front wheel walker  Distance Ambulated (ft) 8 ft  Activity Response Tolerated well  Mobility Referral Yes  $Mobility charge 1 Mobility   Pt resting in recliner on RA upon entry. Pt STS and ambulates in room around bed with AD CGA. Pt gait is short choppy steps. Pt given vocal cues to take longer steps and he did initially but would revert back to the short choppy steps. Pt returned to recliner and left with needs in reach.   Loma Sender Mobility Specialist 03/27/22, 2:49 PM

## 2022-03-27 NOTE — Progress Notes (Addendum)
PROGRESS NOTE    Kevin Bradshaw Sherry  [id number] DOB: [birth year] DOA: 03/24/2022  PCP: [name], MD    Brief Narrative: This 87 years old male with PMH significant for A-fib not on anticoagulation secondary to recurrent falls, hyperlipidemia, hypertension, Parkinson disease, history of CVA in 2019 who presented in the ED with acute onset of severe dysarthria, left hemineglect and left sensory deficit secondary to acute right parietal infarct.  He was admitted in the ICU s/p TNK, tolerated well. Etiology favored to be embolic given patient presented with A-fib, has a history of known paroxysmal A-fib but not on anticoagulation due to frequent falls.  He was seen by cardiology who plans to discuss with family whether he might be a candidate for short-term trial of anticoagulation with close follow-up with EP for consideration of watchman's device.  Patient underwent repeat MRI which did not show any increase in the size of infarct or hemorrhage.  Patient is started on aspirin,  Plavix and DVT prophylaxis. TRH pickup 03/26/2022.  Palliative care consulted, goals of care discussed.  CODE STATUS changed to DNR.  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  S/p TNKase Etiology likely to be embolic secondary to paroxysmal A-fib and patient is not on anticoagulation. Neurology consulted, repeat MRI showed no hemorrhage. Patient is started on aspirin , Plavix and DVT prophylaxis. Echo  showed LVEF 60-65%, No RWMA. Continue neurochecks and NIHSS every shift. SCD for VTE prophylaxis. Repeat CT head for any change in neurological exam. If Anticoagulation is needed in future there is no neurological indication to continue antiplatelets. PT and OT evaluation.  Persistent atrial fibrillation: Heart rate is well-controlled. Cardiology is consulted. Cardio recommended to continue metoprolol, echo showed normal LVEF. Cardiology plans to discuss with family whether he might be a candidate for short-term trial of anticoagulation with close follow-up with  EP for consideration for watchman's device.  Recurrent falls: PT and OT recommended home health services but Family wants long-term placement. Continue fall precautions  BPH: Patient denies any LUTS symptoms. Continue Proscar and Flomax.  Parkinson's disease: Patient not on any anti-Parkinson medications.  Goals of care discussion: Palliative care consulted.  Goals of care discussed.   Family and patient has decided DNR.  DVT prophylaxis: SCDs Code Status:DNR Family Communication: No family at bed side Disposition Plan:   Admitted for acute CVA requiring TNKase, tolerated well.  PT and OT recommended home health services. Family is requesting long term placement.  Consultants:  Neurology  Procedures: MRI Brain  Antimicrobials: None  Subjective: Patient was seen and examined at bedside.  Overnight events noted.   Patient reports feeling much improved.  He is s/p TNKase for acute stroke.   He is able to move all extremities.  Objective: Vitals:   03/26/22 1942 03/27/22 0035 03/27/22 0512 03/27/22 0718  BP: (!) 130/54 125/84 (!) 141/84 (!) 112/92  Pulse: 77 74 84 91  Resp: 16 16 16 16   Temp: 97.8 F (36.6 C) 98 F (36.7 C) 98.5  F (36.9 C) 98.1 F (36.7 C)  TempSrc: Oral  Oral   SpO2: 96% 96% 95% 95%  Weight:      Height:        Intake/Output Summary (Last 24 hours) at 03/27/2022 1022 Last data  filed at 03/27/2022 0700 Gross per 24 hour  Intake 240 ml  Output 1525 ml  Net -1285 ml   Filed Weights   03/24/22 1809 03/24/22 2013  Weight: 72.6 kg 69.9 kg    Examination:  General exam: Appears calm and comfortable, deconditioned, not in any acute distress. Respiratory system: CTA bilaterally, respiratory effort normal, RR 15. Cardiovascular system: S1 & S2 heard, irregular rhythm, no murmur. Gastrointestinal system: Abdomen is soft, nontender, nondistended, BS+ Central nervous system: Alert and oriented x 3. No focal neurological deficits. Extremities: No edema, no cyanosis, no clubbing. Skin: No rashes, lesions or ulcers Psychiatry: Judgement and insight appear normal. Mood & affect appropriate.     Data Reviewed: I have personally reviewed following labs and imaging studies  CBC: Recent Labs  Lab 03/24/22 1229 03/25/22 0418 03/26/22 0448 03/27/22 0440  WBC 4.2 6.5 4.7 5.1  NEUTROABS 2.2  --   --   --   HGB 12.9* 14.3 12.9* 14.0  HCT 40.5 45.0 40.6 43.8  MCV 94.8 93.8 92.9 94.0  PLT 152 170 150 0000000   Basic Metabolic Panel: Recent Labs  Lab 03/24/22 1229 03/25/22 0418 03/26/22 0448 03/27/22 0440  NA 139 142 140 141  K 3.7 3.8 3.8 4.8  CL 106 107 107 105  CO2 26 27 27 29   GLUCOSE 143* 86 103* 105*  BUN 12 10 12 13   CREATININE 1.14 0.99 0.96 0.93  CALCIUM 8.6* 9.0 8.6* 9.4  MG  --  2.1 1.9 1.9  PHOS  --  3.2 3.2 3.6   GFR: Estimated Creatinine Clearance: 50.8 mL/min (by C-G formula based on SCr of 0.93 mg/dL). Liver Function Tests: Recent Labs  Lab 03/24/22 1229 03/26/22 0448 03/27/22 0440  AST 28  --   --   ALT 19  --   --   ALKPHOS 88  --   --   BILITOT 0.8  --   --   PROT 6.7  --   --   ALBUMIN 3.3* 3.1* 3.1*   No results for input(s): "LIPASE", "AMYLASE" in the last 168 hours. No results for input(s): "AMMONIA" in the last 168 hours. Coagulation Profile: Recent Labs  Lab 03/24/22 1229  INR 1.0   Cardiac Enzymes: No results for  input(s): "CKTOTAL", "CKMB", "CKMBINDEX", "TROPONINI" in the last 168 hours. BNP (last 3 results) No results for input(s): "PROBNP" in the last 8760 hours. HbA1C: Recent Labs    03/24/22 1229  HGBA1C 6.1*   CBG: Recent Labs  Lab 03/26/22 0037 03/26/22 0503 03/26/22 0808 03/26/22 1143 03/26/22 1632  GLUCAP 129* 100* 98 121* 120*   Lipid Profile: Recent Labs    03/25/22 0418  CHOL 160  HDL 68  LDLCALC 77  TRIG 76  CHOLHDL 2.4   Thyroid Function Tests: Recent Labs    03/25/22 0418  TSH 2.417  FREET4 0.97  T3FREE 2.7   Anemia Panel: No results for input(s): "VITAMINB12", "FOLATE", "FERRITIN", "TIBC", "IRON", "RETICCTPCT" in the last 72 hours. Sepsis Labs: No results for input(s): "PROCALCITON", "LATICACIDVEN" in the last 168 hours.  Recent Results (from the past 240 hour(s))  MRSA Next Gen by PCR, Nasal     Status: Abnormal   Collection Time: 03/24/22  8:16  PM   Specimen: Nasal Mucosa; Nasal Swab  Result Value Ref Range Status   MRSA by PCR Next Gen DETECTED (A) NOT DETECTED Final    Comment: RESULT CALLED TO, READ BACK BY AND VERIFIED WITH: [facility] MOORE 03/24/22 2208 MU (NOTE) The GeneXpert MRSA Assay (FDA approved for NASAL specimens only), is one component of a comprehensive MRSA colonization surveillance program. It is not intended to diagnose MRSA infection nor to guide or monitor treatment for MRSA infections. Test performance is not FDA approved in patients less than 60 years old. Performed at [facility], [address], [place],  [address]     Radiology Studies: MR BRAIN WO CONTRAST  Result Date: 03/25/2022 CLINICAL DATA:  Stroke, follow up. EXAM: MRI HEAD WITHOUT CONTRAST TECHNIQUE: Multiplanar, multiecho pulse sequences of the brain and surrounding structures were obtained without intravenous contrast. COMPARISON:  Head CT 03/24/2022 and MRI 08/21/2017 FINDINGS: The study is moderately motion degraded. Brain: There is a small acute  cortical infarct posteriorly in the right frontal lobe. A right parieto-occipital infarct is chronic. There is also a chronic lacunar infarct in the posterior left lentiform nucleus. There is advanced central predominant cerebral atrophy. T2 hyperintensities in the periventricular white matter bilaterally are nonspecific but compatible with mild chronic small vessel ischemic disease. No intracranial hemorrhage, mass, midline shift, or extra-axial fluid collection is identified. Vascular: Major intracranial vascular flow voids are preserved. Skull and upper cervical spine: Unremarkable bone marrow signal. Sinuses/Orbits: Unremarkable orbits. Mild left ethmoid sinus mucosal thickening. Clear mastoid air cells. Other: None. IMPRESSION: 1. Small acute right frontal infarct. 2. Chronic right parieto-occipital and left basal ganglia infarcts. 3. Mild chronic small vessel ischemic disease and advanced cerebral atrophy. Electronically Signed   By: Logan Bores M.D.   On: 03/25/2022 18:45    Scheduled Meds:  aspirin EC  81 mg Oral Daily   atorvastatin  80 mg Oral QHS   Chlorhexidine Gluconate Cloth  6 each Topical Daily   clopidogrel  75 mg Oral Daily   enoxaparin (LOVENOX) injection  40 mg Subcutaneous Q24H   ezetimibe  10 mg Oral Daily   feeding supplement  237 mL Oral TID BM   finasteride  5 mg Oral Daily   metoprolol succinate  25 mg Oral Daily   multivitamin with minerals  1 tablet Oral Daily   mupirocin ointment  1 Application Nasal BID   pantoprazole  40 mg Oral QHS   tamsulosin  0.4 mg Oral Q supper   Continuous Infusions:   LOS: 3 days    Time spent: 35 mins    Duard Brady, MD [facility]   If 7PM-7AM, please contact night-coverage

## 2022-03-27 NOTE — Progress Notes (Signed)
Physical Therapy Treatment Patient Details Name: Kevin Bradshaw MRN: [id number] DOB: [birth year] Today's Date: 03/27/2022   History of Present Illness his is a 87 yo man with hx a fib not on anticoagulation 2/2 frequent falls, HL, HTN, Parkinson disease, stroke 2019 who presents with acute onset severe dysarthria, L hemineglect, and L sensory deficit. NIHSS = 7. CT head showed acute R parietal infarct ASPECTS 8. Patient was mildly confused and had difficulty following simple commands. Patient is s/p TNK    PT Comments    Pt able to progress gait distance with RW and MinA to 73ft this pm. Increased struggle with parkinsonian gait due to shuffling and freezing episodes. Pt has had frequent falls at home and now resides in a w/c for most of the day. Discussed high fall risk with pt and pt's daughter who stated pt does not take any meds for Parkinsons. Pt may benefit from Neurology consult to improve quality of life and reduce risk for falls if appropriate. Continue to recommend short term rehab at SNF once medically stable.    Recommendations for follow up therapy are one component of a multi-disciplinary discharge planning process, led by the attending physician.  Recommendations may be updated based on patient status, additional functional criteria and insurance authorization.  Follow Up Recommendations  Skilled nursing-short term rehab (<3 hours/day) Can patient physically be transported by private vehicle: Yes   Assistance Recommended at Discharge Frequent or constant Supervision/Assistance  Patient can return home with the following A little help with walking and/or transfers;A little help with bathing/dressing/bathroom;Help with stairs or ramp for entrance;Assist for transportation;Assistance with cooking/housework;Direct supervision/assist for medications management   Equipment Recommendations  None recommended by PT    Recommendations for Other Services       Precautions /  Restrictions Precautions Precautions: Fall Precaution Comments: frequent falls at home, uses w/c a lot Restrictions Weight Bearing Restrictions: No     Mobility  Bed Mobility               General bed mobility comments:  (N/T)    Transfers Overall transfer level: Needs assistance Equipment used: Rolling walker (2 wheels) Transfers: Sit to/from Stand Sit to Stand: Min assist           General transfer comment:  (Heavy cues for safety when turning to sit down)    Ambulation/Gait Ambulation/Gait assistance: Min assist Gait Distance (Feet): 85 Feet Assistive device: Rolling walker (2 wheels) Gait Pattern/deviations: Shuffle, Decreased step length - right, Decreased step length - left, Drifts right/left Gait velocity: decr     General Gait Details: Parkinsonian gait with frequent stops and difficulty initiating   Stairs             Wheelchair Mobility    Modified Rankin (Stroke Patients Only)       Balance Overall balance assessment: History of Falls, Needs assistance Sitting-balance support: Feet supported Sitting balance-Leahy Scale: Fair     Standing balance support: Bilateral upper extremity supported, During functional activity, Reliant on assistive device for balance Standing balance-Leahy Scale: Fair Standing balance comment:  (requires UE support for dynamic balance in standing)                            Cognition Arousal/Alertness: Awake/alert Behavior During Therapy: WFL for tasks assessed/performed Overall Cognitive Status: Within Functional Limits for tasks assessed  General Comments: A & O x 3        Exercises General Exercises - Lower Extremity Ankle Circles/Pumps: AROM, Both, 5 reps Long Arc Quad: AROM, Both, 5 reps    General Comments General comments (skin integrity, edema, etc.): Discussed role pf PT and pt's current LOF as well as fall hx with pt and family       Pertinent Vitals/Pain Pain Assessment Pain Assessment: No/denies pain    Home Living                          Prior Function            PT Goals (current goals can now be found in the care plan section) Acute Rehab PT Goals Patient Stated Goal: to return home    Frequency    7X/week      PT Plan Discharge plan needs to be updated    Co-evaluation              AM-PAC PT "6 Clicks" Mobility   Outcome Measure  Help needed turning from your back to your side while in a flat bed without using bedrails?: A Lot Help needed moving from lying on your back to sitting on the side of a flat bed without using bedrails?: A Lot Help needed moving to and from a bed to a chair (including a wheelchair)?: A Little Help needed standing up from a chair using your arms (e.g., wheelchair or bedside chair)?: A Little Help needed to walk in hospital room?: A Little Help needed climbing 3-5 steps with a railing? : A Lot 6 Click Score: 15    End of Session Equipment Utilized During Treatment: Gait belt Activity Tolerance: Patient tolerated treatment well Patient left: in chair;with call bell/phone within reach;with chair alarm set Nurse Communication: Mobility status PT Visit Diagnosis: Unsteadiness on feet (R26.81);Difficulty in walking, not elsewhere classified (R26.2);Repeated falls (R29.6)     Time: 1636-1700 PT Time Calculation (min) (ACUTE ONLY): 24 min  Charges:  $Gait Training: 8-22 mins $Therapeutic Exercise: 8-22 mins                    Mikel Cella, PTA  Josie Dixon 03/27/2022, 5:23 PM

## 2022-03-28 DIAGNOSIS — I639 Cerebral infarction, unspecified: Secondary | ICD-10-CM | POA: Diagnosis not present

## 2022-03-28 LAB — RENAL FUNCTION PANEL
Albumin: 3 g/dL — ABNORMAL LOW (ref 3.5–5.0)
Anion gap: 6 (ref 5–15)
BUN: 21 mg/dL (ref 8–23)
CO2: 26 mmol/L (ref 22–32)
Calcium: 8.8 mg/dL — ABNORMAL LOW (ref 8.9–10.3)
Chloride: 104 mmol/L (ref 98–111)
Creatinine, Ser: 0.91 mg/dL (ref 0.61–1.24)
GFR, Estimated: >60 mL/min (ref >60)
Glucose, Bld: 115 mg/dL — ABNORMAL HIGH (ref 70–99)
Phosphorus: 4.4 mg/dL (ref 2.5–4.6)
Potassium: 3.6 mmol/L (ref 3.5–5.1)
Sodium: 136 mmol/L (ref 135–145)

## 2022-03-28 LAB — CBC
HCT: 41.2 % (ref 39.0–52.0)
Hemoglobin: 13.3 g/dL (ref 13.0–17.0)
MCH: 29.8 pg (ref 26.0–34.0)
MCHC: 32.3 g/dL (ref 30.0–36.0)
MCV: 92.4 fL (ref 80.0–100.0)
Platelets: 160 10*3/uL (ref 150–400)
RBC: 4.46 MIL/uL (ref 4.22–5.81)
RDW: 13.8 % (ref 11.5–15.5)
WBC: 4.4 10*3/uL (ref 4.0–10.5)
nRBC: 0 % (ref 0.0–0.2)

## 2022-03-28 NOTE — Progress Notes (Signed)
PT Cancellation Note  Patient Details Name: Kevin Bradshaw MRN: [id number] DOB: 09/29/1935   Cancelled Treatment:     PT attempt. RN staff in room assisting pt with bath. Acute PT will continue to follow and progress as able per current POC.    Willette Pa 03/28/2022, 3:42 PM

## 2022-03-28 NOTE — Progress Notes (Signed)
Speech Language Pathology Treatment: Cognitive-Linquistic  Patient Details Name: Kevin Bradshaw MRN: [id number] DOB: 07-16-1935 Today's Date: 03/28/2022 Time: [id number] SLP Time Calculation (min) (ACUTE ONLY): 15 min  Assessment / Plan / Recommendation Clinical Impression  Pt seen for skilled dysarthria tx. Pt alert, pleasant, and cooperative. Pt continues to present with a moderate dysarthria most c/w ataxic type. Speech is c/b irregular rate/rhythm and articulatory imprecision. Pt approx 70% intelligible without known context. Reviewed compensatory strateges for dysarthria including slowed speech rate and increased vocal loudness. Pt required mod-cues for use during structured tasks. Decreased carry-over noted as pt required max cues during informal conversational exchanges.   Recommend continued SLP services acute and post-acute for dysarthria. SLP to continue to f/u while pt in house.   HPI HPI: Per H&P, pt " is a 87 y.o. male with a hx of hypertension, hyperlipidemia, Parkinson's disease, recurrent falls, paroxysmal A-fib and history of stroke . Pt had been in his normal state of health during the day, his official LKW was around 1400 when his son-in-law saw him outside in his wheelchair getting the mail, eating and drinking a beer. Later on his son-in-law saw him lethargic in the kitchen, which is not unusual for the patient who can get fatigued throughout the day. He asked him if he wanted to go to bed, and the patient seemed to mumble no.  The son in law returned outside, then a little while later emergency services arrived at the house responding to a Life alert activation. At some point the patient had pushed his life alert button. When his son-in-law went into the house he noticed the patient developed acute severe dysarthria, L hemineglect, and L sensory deficit.      Family confirms baseline confusion, reporting he knows his name and his family members but is normally disoriented to time,  situation, and place but can wax and wane. He has frequent falls, 2 weeks ago he fell 4 times. He is mostly wheelchair bound, but is able to walk some short distances. He can get confused with how to operate his WC. He feeds himself with difficulty due to a baseline tremor, but is independent in other ADL's such as dressing, bathing, toileting.  Family denies any recent complaints or symptoms of illness. He missed his AM dose of his BB, but has otherwise been taking his medication as prescribed. Upon arrival patient mildly confused with difficulty following commands and unable to provide a clear history."      SLP Plan  Continue with current plan of care      Recommendations for follow up therapy are one component of a multi-disciplinary discharge planning process, led by the attending physician.  Recommendations may be updated based on patient status, additional functional criteria and insurance authorization.    Recommendations                   Follow Up Recommendations: Skilled nursing-short term rehab (<3 hours/day) Assistance recommended at discharge: Frequent or constant Supervision/Assistance SLP Visit Diagnosis: Dysarthria and anarthria (R47.1) Plan: Continue with current plan of care          Cherrie Gauze, M.S., [facility] [phone] ([place])  Quintella Baton  03/28/2022, 10:50 AM

## 2022-03-28 NOTE — Progress Notes (Signed)
PROGRESS NOTE    Kevin Bradshaw  [id number] DOB: 07/17/1935 DOA: 03/24/2022  PCP: [name], MD   Brief Narrative: This 87 years old male with PMH significant for A-fib not on anticoagulation secondary to recurrent falls, hyperlipidemia, hypertension, Parkinson disease, history of CVA in 2019 who presented in the ED with acute onset of severe dysarthria, left hemineglect and left sensory deficit secondary to acute right parietal infarct.  He was admitted in the ICU s/p TNK, tolerated well. Etiology favored to be embolic given patient presented with A-fib, has a history of known paroxysmal A-fib but not on anticoagulation due to frequent falls.  He was seen by cardiology who plans to discuss with family whether he might be a candidate for short-term trial of anticoagulation with close follow-up with EP for consideration of watchman's device.  Patient underwent repeat MRI which did not show any increase in the size of infarct or hemorrhage.  Patient is started on aspirin,  Plavix and DVT prophylaxis. TRH pickup 03/26/2022.  Palliative care consulted, goals of care discussed.  CODE STATUS changed to DNR.  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  S/p TNKase Etiology likely to be embolic secondary to paroxysmal A-fib and patient is not on anticoagulation. Neurology consulted, repeat MRI showed no hemorrhage. Patient is started on aspirin , Plavix and DVT prophylaxis. Echo  showed LVEF 60-65%, No RWMA. Continue neurochecks and NIHSS every shift. SCD for VTE prophylaxis. Repeat CT head for any change in neurological exam. If Anticoagulation is needed in future there is no neurological indication to continue antiplatelets. PT and OT recommended skilled nursing facility, TOC started authorization.  Persistent atrial fibrillation: Heart rate is well-controlled. Cardiology is consulted. Cardio recommended to continue metoprolol, echo showed normal LVEF. Cardiology plans to discuss with family whether he might be a candidate for short-term trial of anticoagulation with close follow-up with  EP for consideration for watchman's device.  Recurrent falls: PT and OT recommended home health services but Family wants long-term placement. Continue fall precautions Repeat PT evaluation recommended SNF.  BPH: Patient denies any LUTS symptoms. Continue Proscar and Flomax.  Parkinson's disease: Patient not on any anti-Parkinson medications.  Goals of care discussion: Palliative care consulted.  Goals of care discussed.   Family and patient has decided DNR.  DVT prophylaxis: SCDs Code Status:DNR Family Communication: No family at bed side Disposition Plan:   Admitted for acute CVA requiring TNKase, tolerated well.  PT and OT recommended home health services. Family is requesting long term placement.  TOC started authorization for SNF.  Consultants:  Neurology  Procedures: MRI Brain  Antimicrobials: None  Subjective: Patient was seen and examined at bedside.  Overnight events noted.   Patient reports doing much better.  He was sitting comfortably on the chair, having breakfast. He is able to move all extremities.  He is s/p TNKase for acute stroke.  Objective: Vitals:   03/28/22 0057 03/28/22 0519 03/28/22 0520 03/28/22 0930  BP: (!) 92/54 (!) 126/99 129/73 98/60  Pulse: 87 85 69 (!) 56  Resp: 16 16  16   Temp: 97.6 F (36.4 C) 97.9 F (36.6 C)  98 F  (36.7 C)  TempSrc:      SpO2: 97% 98%  98%  Weight:      Height:        Intake/Output Summary (Last 24 hours) at 03/28/2022 1028 Last data filed at 03/28/2022 0520 Gross per 24 hour  Intake 240 ml  Output 550 ml  Net -310 ml   Filed Weights   03/24/22 1809 03/24/22 2013  Weight: 72.6 kg 69.9 kg    Examination:  General exam: Appears comfortable, deconditioned, not in any acute distress. Respiratory system: CTA bilaterally, respiratory effort normal, RR 15. Cardiovascular system: S1 & S2 heard, irregular rhythm, no murmur. Gastrointestinal system: Abdomen is soft, non tender, non distended, BS+ Central nervous system: Alert and oriented x 3. No focal neurological deficits. Extremities: No edema, no cyanosis, no clubbing. Skin: No rashes, lesions or ulcers Psychiatry: Judgement and insight appear normal. Mood & affect appropriate.     Data Reviewed: I have personally reviewed following labs and imaging studies  CBC: Recent Labs  Lab 03/24/22 1229 03/25/22 0418 03/26/22 0448 03/27/22 0440 03/28/22 0347  WBC 4.2 6.5 4.7 5.1 4.4  NEUTROABS 2.2  --   --   --   --   HGB 12.9* 14.3 12.9* 14.0 13.3  HCT 40.5 45.0 40.6 43.8 41.2  MCV 94.8 93.8 92.9 94.0 92.4  PLT 152 170 150 158 0000000   Basic Metabolic Panel: Recent Labs  Lab 03/24/22 1229 03/25/22 0418 03/26/22 0448 03/27/22 0440 03/28/22 0347  NA 139 142 140 141 136  K 3.7 3.8 3.8 4.8 3.6  CL 106 107 107 105 104  CO2 26 27 27 29 26   GLUCOSE 143* 86 103* 105* 115*  BUN 12 10 12 13 21   CREATININE 1.14 0.99 0.96 0.93 0.91  CALCIUM 8.6* 9.0 8.6* 9.4 8.8*  MG  --  2.1 1.9 1.9  --   PHOS  --  3.2 3.2 3.6 4.4   GFR: Estimated Creatinine Clearance: 51.9 mL/min (by C-G formula based on SCr of 0.91 mg/dL). Liver Function Tests: Recent Labs  Lab 03/24/22 1229 03/26/22 0448 03/27/22 0440 03/28/22 0347  AST 28  --   --   --   ALT 19  --   --   --   ALKPHOS 88  --   --   --   BILITOT 0.8  --   --   --   PROT  6.7  --   --   --   ALBUMIN 3.3* 3.1* 3.1* 3.0*   No results for input(s): "LIPASE", "AMYLASE" in the last 168 hours. No results for input(s): "AMMONIA" in the last 168 hours. Coagulation Profile: Recent Labs  Lab 03/24/22 1229  INR 1.0   Cardiac Enzymes: No results for input(s): "CKTOTAL", "CKMB", "CKMBINDEX", "TROPONINI" in the last 168 hours. BNP (last 3 results) No results for input(s): "PROBNP" in the last 8760 hours. HbA1C: No results for input(s): "HGBA1C" in the last 72 hours.  CBG: Recent Labs  Lab 03/26/22 0037 03/26/22 0503 03/26/22 0808 03/26/22 1143 03/26/22 1632  GLUCAP 129* 100* 98 121* 120*   Lipid Profile: No results for input(s): "CHOL", "HDL", "LDLCALC", "TRIG", "CHOLHDL", "LDLDIRECT" in the last 72 hours.  Thyroid Function Tests: No results for input(s): "TSH", "T4TOTAL", "FREET4", "T3FREE", "THYROIDAB" in the last 72 hours.  Anemia Panel: No results for input(s): "VITAMINB12", "FOLATE", "  FERRITIN", "TIBC", "IRON", "RETICCTPCT" in the last 72 hours. Sepsis Labs: No results for input(s): "PROCALCITON", "LATICACIDVEN" in the last 168 hours.  Recent Results (from the past 240 hour(s))  MRSA Next Gen by PCR, Nasal     Status: Abnormal   Collection Time: 03/24/22  8:16 PM   Specimen: Nasal Mucosa; Nasal Swab  Result Value Ref Range Status   MRSA by PCR Next Gen DETECTED (A) NOT DETECTED Final    Comment: RESULT CALLED TO, READ BACK BY AND VERIFIED WITH: [facility] MOORE 03/24/22 2208 MU (NOTE) The GeneXpert MRSA Assay (FDA approved for NASAL specimens only), is one component of a comprehensive MRSA colonization surveillance program. It is not intended to diagnose MRSA infection nor to guide or monitor treatment for MRSA infections. Test performance is not FDA approved in patients less than 46 years old. Performed at [facility], [address], [place], [place] [address]     Radiology Studies: No results found.  Scheduled Meds:  aspirin  EC  81 mg Oral Daily   atorvastatin  80 mg Oral QHS   Chlorhexidine Gluconate Cloth  6 each Topical Daily   clopidogrel  75 mg Oral Daily   enoxaparin (LOVENOX) injection  40 mg Subcutaneous Q24H   ezetimibe  10 mg Oral Daily   feeding supplement  237 mL Oral TID BM   finasteride  5 mg Oral Daily   metoprolol succinate  25 mg Oral Daily   multivitamin with minerals  1 tablet Oral Daily   mupirocin ointment  1 Application Nasal BID   pantoprazole  40 mg Oral QHS   tamsulosin  0.4 mg Oral Q supper   Continuous Infusions:   LOS: 4 days    Time spent: 35 mins    Duard Brady, MD [facility]   If 7PM-7AM, please contact night-coverage

## 2022-03-28 NOTE — NC FL2 (Signed)
[place] LEVEL OF CARE FORM     IDENTIFICATION  Patient Name: Kevin Bradshaw Birthdate: 1935-07-23 Sex: male Admission Date (Current Location): 03/24/2022  [facility] and [place] Number:  Insurance underwriter  [facility] [phone]) Facility and Address:  [facility], [address], [place], [place] [address]      Provider Number: [id number]  Attending Physician Name and Address:  Duard Brady, MD  Relative Name and Phone Number:  Tonette Bihari [facility]) (Daughter) [phone] (Home Phone)    Current Level of Care: Hospital Recommended Level of Care: [place] Prior Approval Number: [id number]  Date Approved/Denied: 01/24/08 PASRR Number: [id number]  Discharge Plan: SNF    Current Diagnoses: Patient Active Problem List   Diagnosis Date Noted   Dysarthria 03/25/2022   Persistent atrial fibrillation ([place]) 03/25/2022   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 ([place]) 03/25/2022   Personal history of fall 03/25/2022   CVA (cerebral vascular accident) ([place]) 03/24/2022   UTI (urinary tract infection) 10/02/2018   Atrial fibrillation with RVR ([place]) 10/02/2018   Sepsis ([place]) 02/18/2018   Acute CVA (cerebrovascular accident) ([place]) 08/22/2017   Right leg weakness 08/21/2017   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 SLP consulted in acute care.)  AMBULATORY STATUS COMMUNICATION OF NEEDS Skin   Limited Assist (Parkinson's choppy gait, and Left weakness/neglect. Walked with RW about 8  feet with contact/steadying guarding.) Verbally Normal                       Personal Care Assistance Level of Assistance  Bathing, Feeding, Dressing Bathing Assistance: Maximum assistance Feeding assistance: Limited assistance Dressing Assistance: Maximum assistance     Functional Limitations Info  Sight, Speech, Hearing (Dysarthria 2/2 acute stroke.) Sight Info: Impaired (Glasses for correction) Hearing Info: Impaired Speech Info: Impaired (SLP consulted. No functional dysphagia. Wears upper dentures. Mild cognitive impariment. See SLP notes.)    SPECIAL CARE FACTORS FREQUENCY  PT (By licensed PT), OT (By licensed OT)     PT Frequency: 5x/week OT Frequency: 5x/week            Contractures Contractures Info: Not present    Additional Factors Info  Code Status, Allergies Code Status Info: DNR, no CPR or intubation. Allergies Info: No known allergies as of 03/28/22. Please check chart of updates.           Current Medications (03/28/2022):  This is the current hospital active medication list Current Facility-Administered Medications  Medication Dose Route Frequency Provider Last Rate Last Admin   acetaminophen (TYLENOL) tablet 650 mg  650 mg Oral Q4H PRN Rust-Chester, Huel Cote, NP       Or   acetaminophen (TYLENOL) 160 MG/5ML solution 650 mg  650 mg Per Tube Q4H PRN Rust-Chester, Huel Cote, NP       Or   acetaminophen (TYLENOL) suppository 650 mg  650 mg Rectal Q4H PRN Rust-Chester, Huel Cote, NP       aspirin EC tablet 81 mg  81 mg Oral Daily Derek Jack, MD   81 mg at  03/27/22 0821   atorvastatin (LIPITOR) tablet 80 mg  80 mg Oral QHS Duard Brady, MD   80 mg at 03/27/22 2102   Chlorhexidine Gluconate Cloth 2 % PADS 6 each  6 each Topical Daily Rust-Chester, Huel Cote, NP   6 each at 03/27/22 [id number]   clopidogrel (PLAVIX) tablet 75 mg  75 mg Oral Daily Derek Jack, MD   75 mg at 03/27/22 0821   enoxaparin (LOVENOX) injection 40 mg  40 mg Subcutaneous  Q24H Derek Jack, MD   40 mg at 03/27/22 2102   ezetimibe (ZETIA) tablet 10 mg  10 mg Oral Daily Wynelle Cleveland, RPH   10 mg at 03/27/22 [id number]   feeding supplement (ENSURE ENLIVE / ENSURE PLUS) liquid 237 mL  237 mL Oral TID BM Ottie Glazier, MD   237 mL at 03/27/22 2101   finasteride (PROSCAR) tablet 5 mg  5 mg Oral Daily Duard Brady, MD   5 mg at 03/27/22 [id number]   labetalol (NORMODYNE) injection 10 mg  10 mg Intravenous Q2H PRN Rust-Chester, Huel Cote, NP       metoprolol succinate (TOPROL-XL) 24 hr tablet 25 mg  25 mg Oral Daily Minna Merritts, MD   25 mg at 03/27/22 [id number]   multivitamin with minerals tablet 1 tablet  1 tablet Oral Daily Ottie Glazier, MD   1 tablet at 03/27/22 [id number]   mupirocin ointment (BACTROBAN) 2 % 1 Application  1 Application Nasal BID Rust-Chester, Huel Cote, NP   1 Application at [id number] 2101   pantoprazole (PROTONIX) EC tablet 40 mg  40 mg Oral QHS Benita Gutter, RPH   40 mg at 03/27/22 2102   senna-docusate (Senokot-S) tablet 1 tablet  1 tablet Oral QHS PRN Rust-Chester, Huel Cote, NP       tamsulosin (FLOMAX) capsule 0.4 mg  0.4 mg Oral Q supper Duard Brady, MD   0.4 mg at 03/27/22 1633     Discharge Medications: Please see discharge summary for a list of discharge medications.  Relevant Imaging Results:  Relevant Lab Results:   Additional Information SSN: [id number]  Izola Price, RN

## 2022-03-28 NOTE — Progress Notes (Signed)
Occupational Therapy Treatment Patient Details Name: Kevin Bradshaw MRN: [id number] DOB: [birth year] Today's Date: 03/28/2022   History of present illness Pt is an 87 yo man with hx a fib not on anticoagulation 2/2 frequent falls, HL, HTN, Parkinson disease, stroke 2019 who presents with acute onset severe dysarthria, L hemineglect, and L sensory deficit. NIHSS = 7. CT head showed acute R parietal infarct ASPECTS 8. Patient was mildly confused and had difficulty following simple commands. Patient is s/p TNK   OT comments  Patient receiving sitting up in bed completing self-feeding and agreeable to OT. Tx session targeted improving tolerance for functional mobility in the setting of ADL tasks. Pt able to come to EOB with Min guard this date. He then completed functional mobility to the [facility] with Min A using RW. Pt unable to void on BSC, however, found to have dried stool on buttocks and required Min A for posterior hygiene. Pt left sitting in recliner with all needs in reach.  D/C recommendations updated this date to facilitate return to PLOF and reduce caregiver burden. Per TOC, daughter works and is unable to provide level of care needed. OT will continue to follow acutely.    Recommendations for follow up therapy are one component of a multi-disciplinary discharge planning process, led by the attending physician.  Recommendations may be updated based on patient status, additional functional criteria and insurance authorization.    Follow Up Recommendations  Skilled nursing-short term rehab (<3 hours/day)     Assistance Recommended at Discharge Intermittent Supervision/Assistance  Patient can return home with the following  A little help with walking and/or transfers;A little help with bathing/dressing/bathroom;Help with stairs or ramp for entrance;Assist for transportation;Assistance with cooking/housework   Equipment Recommendations  BSC/3in1    Recommendations for Other Services       Precautions / Restrictions Precautions Precautions: Fall Precaution Comments: frequent falls at home, uses w/c a lot Restrictions Weight Bearing Restrictions: No       Mobility Bed Mobility Overal bed mobility: Needs Assistance Bed Mobility: Supine to Sit     Supine to sit: HOB elevated, Min guard          Transfers Overall transfer level: Needs assistance Equipment used: Rolling walker (2 wheels) Transfers: Sit to/from Stand Sit to Stand: Min assist                 Balance Overall balance assessment: History of Falls, Needs assistance Sitting-balance support: Feet supported Sitting balance-Leahy Scale: Good     Standing balance support: Bilateral upper extremity supported, During functional activity, Reliant on assistive device for balance Standing balance-Leahy Scale: Fair                             ADL either performed or assessed with clinical judgement   ADL Overall ADL's : Needs assistance/impaired     Grooming: Set up;Sitting;Wash/dry face           Upper Body Dressing : Set up;Sitting Upper Body Dressing Details (indicate cue type and reason): to don/doff gown     Toilet Transfer: Minimal assistance;Rolling walker (2 wheels);Ambulation;BSC/3in1   Toileting- Clothing Manipulation and Hygiene: Minimal assistance;Sit to/from stand Toileting - Clothing Manipulation Details (indicate cue type and reason): for thoroughness of posterior hygiene, pt with dried stool on buttocks     Functional mobility during ADLs: Rolling walker (2 wheels);Cueing for sequencing;Cueing for safety;Minimal assistance (~10 ft to [facility], assist for RW managment, increased time  required 2/2 difficulty coordinating steps with turns)      Extremity/Trunk Assessment              Vision Baseline Vision/History: 1 Wears glasses Patient Visual Report: No change from baseline     Perception     Praxis      Cognition Arousal/Alertness:  Awake/alert Behavior During Therapy: WFL for tasks assessed/performed Overall Cognitive Status: Within Functional Limits for tasks assessed            Exercises      Shoulder Instructions       General Comments      Pertinent Vitals/ Pain       Pain Assessment Pain Assessment: No/denies pain  Home Living            Prior Functioning/Environment              Frequency  Min 2X/week        Progress Toward Goals  OT Goals(current goals can now be found in the care plan section)  Progress towards OT goals: Progressing toward goals  Acute Rehab OT Goals Patient Stated Goal: to return home OT Goal Formulation: With patient Time For Goal Achievement: 04/09/22 Potential to Achieve Goals: Good  Plan Discharge plan needs to be updated;Frequency remains appropriate    Co-evaluation                 AM-PAC OT "6 Clicks" Daily Activity     Outcome Measure   Help from another person eating meals?: None Help from another person taking care of personal grooming?: A Little Help from another person toileting, which includes using toliet, bedpan, or urinal?: A Little Help from another person bathing (including washing, rinsing, drying)?: A Little Help from another person to put on and taking off regular upper body clothing?: A Little Help from another person to put on and taking off regular lower body clothing?: A Little 6 Click Score: 19    End of Session Equipment Utilized During Treatment: Rolling walker (2 wheels);Gait belt  OT Visit Diagnosis: Unsteadiness on feet (R26.81);Repeated falls (R29.6);Muscle weakness (generalized) (M62.81)   Activity Tolerance Patient tolerated treatment well   Patient Left in chair;with call bell/phone within reach;with chair alarm set   Nurse Communication Mobility status        Time: [id number] OT Time Calculation (min): 31 min  Charges: OT General Charges $OT Visit: 1 Visit OT Treatments $Self Care/Home  Management : 23-37 mins  [facility] MS, OTR/L ascom [phone]  03/28/22, 1:12 PM

## 2022-03-28 NOTE — TOC Initial Note (Signed)
Transition of Care [facility]) - Initial/Assessment Note    Patient Details  Name: Kevin Bradshaw MRN: [id number] Date of Birth: May 04, 1935  Transition of Care [facility]) CM/SW Contact:    Izola Price, RN Phone Number: 03/28/2022, 9:52 AM  Clinical Narrative:   3/17: Patient admitted via EMS from home with dysarthria. Left sided weakness/neglect. Code Stroke activated at that time. NIHSS of 7. Confused at baseline to some degree per daughter. Today patient is oriented to person, place, but not time. Htx of Parkinson's, falls, Afib, prior stroke in 2019 per ED notes. Mostly WC bound at home per PT notes. CT positive for R frontal infarct and patient received TNK per consult notes. Made a DNR on 03/26/22 after palliative consult: "Continue to treat the treatable but no CPR or intubation".PT/OT rec STR/SNF.  Walked 8 feet with mobility specialist on 03/27/22 with RW with contact guard/steadying assist with short choppy steps despite verbal cueing by mobility specialist. Not on any Parkinson's medications prior to this admission. Daughter works and is unable to provide level of care needed. TOC RN CM spoke with daughter, Luane School, and she has dicussed STR options and plan with patient: 1. [facility] 2. [facility], daughter states looking at [facility] post rehab. Palliative following. PASRR [id number] A. Has been to [facility] 3-4 years ago. Simmie Davies RN CM [id number] am.                       Patient Goals and CMS Choice            Expected Discharge Plan and Services                                              Prior Living Arrangements/Services                       Activities of Daily Living Home Assistive Devices/Equipment: Wheelchair    Permission Sought/Granted                  Emotional Assessment              Admission diagnosis:  CVA (cerebral vascular accident) [facility]) [I63.9] Dysarthria [R47.1] Cerebrovascular accident (CVA), unspecified  mechanism ([place]) [I63.9] Patient Active Problem List   Diagnosis Date Noted   Dysarthria 03/25/2022   Persistent atrial fibrillation ([place]) 03/25/2022   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 ([place]) 03/25/2022   Personal history of fall 03/25/2022   CVA (cerebral vascular accident) ([place]) 03/24/2022   UTI (urinary tract infection) 10/02/2018   Atrial fibrillation with RVR ([place]) 10/02/2018   Sepsis ([place]) 02/18/2018   Acute CVA (cerebrovascular accident) ([place]) 08/22/2017   Right leg weakness 08/21/2017   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[place]) 01/21/2017   CAP (community acquired pneumonia) 01/06/2016   Productive cough 11/09/2010   Hypertension 11/04/2010   Hyperlipidemia 11/04/2010   PCP:  [name], MD Pharmacy:   [facility] [address]), [place]) [place] [address] Phone: [phone] Fax: [place], [place], [place] [id number], [place] [address] Phone: [phone] Fax: [phone]  [id number] - [address] [place] [address] Phone: [phone] Fax: [phone]  Social Determinants of Health (SDOH) Social History: SDOH Screenings   Tobacco Use: Medium Risk (03/25/2022)   SDOH Interventions:     Readmission Risk Interventions     No data to display

## 2022-03-28 NOTE — TOC Progression Note (Signed)
Transition of Care [facility] - Progression Note    Patient Details  Name: Kevin Bradshaw MRN: [id number] Date of Birth: 1935/12/15  Transition of Care [facility]) CM/SW Contact  Izola Price, RN Phone Number: 03/28/2022, 10:35 AM  Clinical Narrative: 03/28/22: After conversation with daughter, who is HCPOA, first choice/preference for STR/SNF is [facility], with second choice/preference being [facility]. Daughter is looking at [facility] after rehab and patient has been at [facility] 3-4 years ago. Does not want [facility] or [facility] but gave verbal permission to seek local alternatives to first and second choices in case of denials for some reason. Wants to be notified if first choices deny. Signed FL2 and CSW sent via hub. Updated provider. Simmie Davies RN CM        Expected Discharge Plan and Services                                               Social Determinants of Health (SDOH) Interventions SDOH Screenings   Tobacco Use: Medium Risk (03/25/2022)    Readmission Risk Interventions     No data to display

## 2022-03-29 DIAGNOSIS — I639 Cerebral infarction, unspecified: Secondary | ICD-10-CM | POA: Diagnosis not present

## 2022-03-29 NOTE — Progress Notes (Signed)
SLP Cancellation Note  Patient Details Name: Tina Wlodarczyk MRN: [id number] DOB: 1935/02/23   Cancelled treatment:       Reason Eval/Treat Not Completed: Patient at procedure or test/unavailable. Pt unavailable d/t using restroom w/ nurse assistance. Will attempt tx later today as able.  Randall Hiss Graduate Clinician [place], Speech Pathology   Randall Hiss 03/29/2022, 12:11 PM

## 2022-03-29 NOTE — Progress Notes (Signed)
Speech Language Pathology Treatment: Cognitive-Linquistic  Patient Details Name: Kevin Bradshaw MRN: [id number] DOB: 12/01/1935 Today's Date: 03/29/2022 Time: 1140-1200 SLP Time Calculation (min) (ACUTE ONLY): 20 min  Assessment / Plan / Recommendation Clinical Impression  Pt seen today for dysarthria tx. Pt alert, cooperative, and pleasant t/o tx. Pt left laying in bed w/ call button in reach and bed alarm set.  Pt continues to present with a moderate dysarthria most c/w ataxic type. Speech is c/b irregular rate/rhythm and articulatory imprecision. Pt approx 70% intelligible without known context. Reviewed compensatory strateges for dysarthria including slowed speech rate and increased vocal loudness. Pt required mod-cues for use during structured tasks. Decreased carry-over noted as pt required max cues during informal conversational exchanges. More improvement noted at word/phrase level and w/ structured question/answer format.   Recommend continued SLP services acute and post-acute for dysarthria. SLP to continue to f/u while pt in house.    HPI HPI: Per H&P, pt " is a 87 y.o. male with a hx of hypertension, hyperlipidemia, Parkinson's disease, recurrent falls, paroxysmal A-fib and history of stroke . Pt had been in his normal state of health during the day, his official LKW was around 1400 when his son-in-law saw him outside in his wheelchair getting the mail, eating and drinking a beer. Later on his son-in-law saw him lethargic in the kitchen, which is not unusual for the patient who can get fatigued throughout the day. He asked him if he wanted to go to bed, and the patient seemed to mumble no.  The son in law returned outside, then a little while later emergency services arrived at the house responding to a Life alert activation. At some point the patient had pushed his life alert button. When his son-in-law went into the house he noticed the patient developed acute severe dysarthria, L  hemineglect, and L sensory deficit.      Family confirms baseline confusion, reporting he knows his name and his family members but is normally disoriented to time, situation, and place but can wax and wane. He has frequent falls, 2 weeks ago he fell 4 times. He is mostly wheelchair bound, but is able to walk some short distances. He can get confused with how to operate his WC. He feeds himself with difficulty due to a baseline tremor, but is independent in other ADL's such as dressing, bathing, toileting.  Family denies any recent complaints or symptoms of illness. He missed his AM dose of his BB, but has otherwise been taking his medication as prescribed. Upon arrival patient mildly confused with difficulty following commands and unable to provide a clear history."      SLP Plan  Continue with current plan of care      Recommendations for follow up therapy are one component of a multi-disciplinary discharge planning process, led by the attending physician.  Recommendations may be updated based on patient status, additional functional criteria and insurance authorization.    Recommendations                   Follow Up Recommendations: Skilled nursing-short term rehab (<3 hours/day) Assistance recommended at discharge: Frequent or constant Supervision/Assistance SLP Visit Diagnosis: Dysarthria and anarthria (R47.1) Plan: Continue with current plan of care         [place], Speech Pathology   Randall Hiss  03/29/2022, 1:19 PM

## 2022-03-29 NOTE — Progress Notes (Signed)
Physical Therapy Treatment Patient Details Name: Kevin Bradshaw MRN: [id number] DOB: 16-Jul-1935 Today's Date: 03/29/2022   History of Present Illness Pt is an 87 yo man with hx a fib not on anticoagulation 2/2 frequent falls, HL, HTN, Parkinson disease, stroke 2019 who presents with acute onset severe dysarthria, L hemineglect, and L sensory deficit. NIHSS = 7. CT head showed acute R parietal infarct ASPECTS 8. Patient was mildly confused and had difficulty following simple commands. Patient is s/p TNK    PT Comments    Pt received in bed, had not been up yet today. Pt assisted to EOB with HOB raised, CGA, and vc's for technique. Good sitting balance unsupported, minor lower back discomfort. Sit to stand with CG/MinA for initial balance at RW. Pt demonstrated gait training in room with difficulty staying inside RW and continues to struggle with LE advancement/shuffling. Visual cues given for proper stepping and manual assist to prevent pt from advancing RW too far in front. Pt positioned to comfort in recliner with chair alarm engaged. Family visiting. Awaiting d/c to SNF once medically cleared for d/c.    Recommendations for follow up therapy are one component of a multi-disciplinary discharge planning process, led by the attending physician.  Recommendations may be updated based on patient status, additional functional criteria and insurance authorization.  Follow Up Recommendations  Skilled nursing-short term rehab (<3 hours/day) Can patient physically be transported by private vehicle: Yes   Assistance Recommended at Discharge Frequent or constant Supervision/Assistance  Patient can return home with the following A little help with walking and/or transfers;A little help with bathing/dressing/bathroom;Help with stairs or ramp for entrance;Assist for transportation;Assistance with cooking/housework;Direct supervision/assist for medications management   Equipment Recommendations  None  recommended by PT (TBD at next facility)    Recommendations for Other Services       Precautions / Restrictions Precautions Precautions: Fall Precaution Comments: frequent falls at home, uses w/c a lot Restrictions Weight Bearing Restrictions: No     Mobility  Bed Mobility Overal bed mobility: Needs Assistance Bed Mobility: Supine to Sit     Supine to sit: HOB elevated, Min guard          Transfers Overall transfer level: Needs assistance Equipment used: Rolling walker (2 wheels) Transfers: Sit to/from Stand Sit to Stand: Min assist           General transfer comment:  (MinA to gain safe upright stance upon standing)    Ambulation/Gait Ambulation/Gait assistance: Min assist Gait Distance (Feet): 20 Feet Assistive device: Rolling walker (2 wheels) Gait Pattern/deviations: Shuffle, Decreased step length - right, Decreased step length - left, Drifts right/left Gait velocity: decr     General Gait Details: Parkinsonian gait with frequent stops and difficulty initiating   Stairs             Wheelchair Mobility    Modified Rankin (Stroke Patients Only)       Balance Overall balance assessment: History of Falls, Needs assistance Sitting-balance support: Feet supported Sitting balance-Leahy Scale: Good     Standing balance support: Bilateral upper extremity supported, During functional activity, Reliant on assistive device for balance Standing balance-Leahy Scale: Fair Standing balance comment:  (High fall risk with dynamic standing activities with RW)                            Cognition Arousal/Alertness: Awake/alert Behavior During Therapy: WFL for tasks assessed/performed Overall Cognitive Status: Within Functional Limits for tasks  assessed                                 General Comments: decreased safety awareness at times        Exercises General Exercises - Lower Extremity Ankle Circles/Pumps: AROM, Both,  5 reps Heel Slides: AROM, Both, 10 reps, Strengthening Hip ABduction/ADduction: AROM, Both, 10 reps, Strengthening    General Comments        Pertinent Vitals/Pain Pain Assessment Pain Assessment: No/denies pain    Home Living                          Prior Function            PT Goals (current goals can now be found in the care plan section) Acute Rehab PT Goals Patient Stated Goal: to return home    Frequency    7X/week      PT Plan Discharge plan needs to be updated    Co-evaluation              AM-PAC PT "6 Clicks" Mobility   Outcome Measure  Help needed turning from your back to your side while in a flat bed without using bedrails?: A Lot Help needed moving from lying on your back to sitting on the side of a flat bed without using bedrails?: A Lot Help needed moving to and from a bed to a chair (including a wheelchair)?: A Little Help needed standing up from a chair using your arms (e.g., wheelchair or bedside chair)?: A Little Help needed to walk in hospital room?: A Little Help needed climbing 3-5 steps with a railing? : A Lot 6 Click Score: 15    End of Session Equipment Utilized During Treatment: Gait belt Activity Tolerance: Patient tolerated treatment well Patient left: in chair;with call bell/phone within reach;with chair alarm set;with family/visitor present Nurse Communication: Mobility status PT Visit Diagnosis: Unsteadiness on feet (R26.81);Difficulty in walking, not elsewhere classified (R26.2);Repeated falls (R29.6)     Time: [id number] PT Time Calculation (min) (ACUTE ONLY): 24 min  Charges:  $Gait Training: 8-22 mins $Therapeutic Exercise: 8-22 mins                    Mikel Cella, PTA  Josie Dixon 03/29/2022, 4:15 PM

## 2022-03-29 NOTE — Care Management Important Message (Signed)
Important Message  Patient Details  Name: Kevin Bradshaw MRN: [id number] Date of Birth: 1935/05/05   Medicare Important Message Given:  Yes     [name] 03/29/2022, 12:22 PM

## 2022-03-29 NOTE — Progress Notes (Signed)
PROGRESS NOTE    Kevin Bradshaw  [id number] DOB: 02-Jun-1935 DOA: 03/24/2022  PCP: [name], MD   Brief Narrative: This 87 years old male with PMH significant for A-fib not on anticoagulation secondary to recurrent falls, hyperlipidemia, hypertension, Parkinson disease, history of CVA in 2019 who presented in the ED with acute onset of severe dysarthria, left hemineglect and left sensory deficit secondary to acute right parietal infarct.  He was admitted in the ICU s/p TNK, tolerated well. Etiology favored to be embolic given patient presented with A-fib, has a history of known paroxysmal A-fib but not on anticoagulation due to frequent falls.  He was seen by cardiology who plans to discuss with family whether he might be a candidate for short-term trial of anticoagulation with close follow-up with EP for consideration of watchman's device.  Patient underwent repeat MRI which did not show any increase in the size of infarct or hemorrhage.  Patient is started on aspirin,  Plavix and DVT prophylaxis. TRH pickup 03/26/2022.  Palliative care consulted, goals of care discussed.  CODE STATUS changed to DNR.  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  S/p TNKase Etiology likely to be embolic secondary to paroxysmal A-fib as patient is not on anticoagulation. Neurology consulted, repeat MRI showed no hemorrhage. Patient is started on aspirin , Plavix and DVT prophylaxis. Echo showed  LVEF 60-65%, No RWMA. Continue neurochecks and NIHSS every shift. SCD for VTE prophylaxis. Repeat CT head for any change in neurological exam. If Anticoagulation is needed in future there is no neurological indication to continue antiplatelets. PT and OT recommended skilled nursing facility, TOC started authorization.  Persistent atrial fibrillation: Heart rate is well-controlled. Cardiology is consulted. Cardio recommended to continue metoprolol, echo showed normal LVEF. Cardiology plans to discuss with family whether he might be a candidate for short-term trial of anticoagulation with close follow-up with  EP for consideration for watchman's device.  Recurrent falls: PT and OT recommended home health services but Family wants long-term placement. Continue fall precautions Repeat PT evaluation recommended SNF.  BPH: Patient denies any LUTS symptoms. Continue Proscar and Flomax.  Parkinson's disease: Patient not on any anti-Parkinson medications. Parkinson's medications has to be started outpatient.  Goals of care discussion: Palliative care consulted.  Goals of care discussed in detail.   Family and patient has decided DNR.  DVT prophylaxis: SCDs Code Status:DNR Family Communication: No family at bed side Disposition Plan:   Admitted for acute CVA requiring TNKase, tolerated well.  PT and OT recommended home health services. Family is requesting long term placement.  TOC started authorization for SNF.  Consultants:  Neurology  Procedures: MRI Brain  Antimicrobials: None  Subjective: Patient was seen and examined at bedside.  Overnight events noted.   Patient reports doing much better.  He was sitting comfortably on the bed, having breakfast. He is able to move all extremities.  He is s/p TNKase for acute stroke.  Objective: Vitals:   03/29/22 0022 03/29/22 0522 03/29/22 0850 03/29/22 1108  BP: (!) 156/133 (!) 145/100 111/63 (!) 118/100  Pulse: (!) 104 92 91 94   Resp: 20 20 18 20   Temp: 97.7 F (36.5 C) 98.5 F (36.9 C) 98.3 F (36.8 C) 98.2 F (36.8 C)  TempSrc:   Oral Oral  SpO2:  95% 94% 98%  Weight:      Height:        Intake/Output Summary (Last 24 hours) at 03/29/2022 1215 Last data filed at 03/29/2022 1100 Gross per 24 hour  Intake 480 ml  Output 1000 ml  Net -520 ml   Filed Weights   03/24/22 1809 03/24/22 2013  Weight: 72.6 kg 69.9 kg    Examination:  General exam : Appears comfortable, not in any acute distress, very deconditioned. Respiratory system: CTA bilaterally, respiratory effort normal, RR 15. Cardiovascular system: S1 & S2 heard, irregular rhythm, no murmur. Gastrointestinal system: Abdomen is soft, non tender, non distended, BS+ Central nervous system: Alert and oriented x 3, no focal neurological deficits. Extremities: No edema, no cyanosis, no clubbing. Skin: No rashes, lesions or ulcers Psychiatry: Judgement and insight appear normal. Mood & affect appropriate.     Data Reviewed: I have personally reviewed following labs and imaging studies  CBC: Recent Labs  Lab 03/24/22 1229 03/25/22 0418 03/26/22 0448 03/27/22 0440 03/28/22 0347  WBC 4.2 6.5 4.7 5.1 4.4  NEUTROABS 2.2  --   --   --   --   HGB 12.9* 14.3 12.9* 14.0 13.3  HCT 40.5 45.0 40.6 43.8 41.2  MCV 94.8 93.8 92.9 94.0 92.4  PLT 152 170 150 158 0000000   Basic Metabolic Panel: Recent Labs  Lab 03/24/22 1229 03/25/22 0418 03/26/22 0448 03/27/22 0440 03/28/22 0347  NA 139 142 140 141 136  K 3.7 3.8 3.8 4.8 3.6  CL 106 107 107 105 104  CO2 26 27 27 29 26   GLUCOSE 143* 86 103* 105* 115*  BUN 12 10 12 13 21   CREATININE 1.14 0.99 0.96 0.93 0.91  CALCIUM 8.6* 9.0 8.6* 9.4 8.8*  MG  --  2.1 1.9 1.9  --   PHOS  --  3.2 3.2 3.6 4.4   GFR: Estimated Creatinine Clearance: 51.9 mL/min (by C-G formula based on SCr of 0.91 mg/dL). Liver Function Tests: Recent Labs  Lab 03/24/22 1229 03/26/22 0448 03/27/22 0440 03/28/22 0347  AST 28   --   --   --   ALT 19  --   --   --   ALKPHOS 88  --   --   --   BILITOT 0.8  --   --   --   PROT 6.7  --   --   --   ALBUMIN 3.3* 3.1* 3.1* 3.0*   No results for input(s): "LIPASE", "AMYLASE" in the last 168 hours. No results for input(s): "AMMONIA" in the last 168 hours. Coagulation Profile: Recent Labs  Lab 03/24/22 1229  INR 1.0   Cardiac Enzymes: No results for input(s): "CKTOTAL", "CKMB", "CKMBINDEX", "TROPONINI" in the last 168 hours. BNP (last 3 results) No results for input(s): "PROBNP" in the last 8760 hours. HbA1C: No results for input(s): "HGBA1C" in the last 72 hours.  CBG: Recent Labs  Lab 03/26/22 0037 03/26/22 0503 03/26/22 0808 03/26/22 1143 03/26/22 1632  GLUCAP 129* 100* 98 121* 120*   Lipid Profile: No results for input(s): "CHOL", "HDL", "LDLCALC", "TRIG", "CHOLHDL", "LDLDIRECT" in the last 72 hours.  Thyroid Function Tests: No results for input(s): "TSH", "T4TOTAL", "FREET4", "T3FREE", "THYROIDAB"  in the last 72 hours.  Anemia Panel: No results for input(s): "VITAMINB12", "FOLATE", "FERRITIN", "TIBC", "IRON", "RETICCTPCT" in the last 72 hours. Sepsis Labs: No results for input(s): "PROCALCITON", "LATICACIDVEN" in the last 168 hours.  Recent Results (from the past 240 hour(s))  MRSA Next Gen by PCR, Nasal     Status: Abnormal   Collection Time: 03/24/22  8:16 PM   Specimen: Nasal Mucosa; Nasal Swab  Result Value Ref Range Status   MRSA by PCR Next Gen DETECTED (A) NOT DETECTED Final    Comment: RESULT CALLED TO, READ BACK BY AND VERIFIED WITH: [facility] MOORE 03/24/22 2208 MU (NOTE) The GeneXpert MRSA Assay (FDA approved for NASAL specimens only), is one component of a comprehensive MRSA colonization surveillance program. It is not intended to diagnose MRSA infection nor to guide or monitor treatment for MRSA infections. Test performance is not FDA approved in patients less than 33 years old. Performed at [facility], [address], [place], [place] [address]     Radiology Studies: No results found.  Scheduled Meds:  aspirin EC  81 mg Oral Daily   atorvastatin  80 mg Oral QHS   Chlorhexidine Gluconate Cloth  6 each Topical Daily   clopidogrel  75 mg Oral Daily   enoxaparin (LOVENOX) injection  40 mg Subcutaneous Q24H   ezetimibe  10 mg Oral Daily   feeding supplement  237 mL Oral TID BM   finasteride  5 mg Oral Daily   metoprolol succinate  25 mg Oral Daily   multivitamin with minerals  1 tablet Oral Daily   mupirocin ointment  1 Application Nasal BID   pantoprazole  40 mg Oral QHS   tamsulosin  0.4 mg Oral Q supper   Continuous Infusions:   LOS: 5 days    Time spent: 35 mins    Duard Brady, MD [facility]   If 7PM-7AM, please contact night-coverage

## 2022-03-30 ENCOUNTER — Telehealth: Payer: Self-pay | Admitting: Cardiology

## 2022-03-30 DIAGNOSIS — I639 Cerebral infarction, unspecified: Secondary | ICD-10-CM | POA: Diagnosis not present

## 2022-03-30 NOTE — Plan of Care (Signed)
  Problem: Education: Goal: Knowledge of disease or condition will improve Outcome: Progressing   Problem: Ischemic Stroke/TIA Tissue Perfusion: Goal: Complications of ischemic stroke/TIA will be minimized Outcome: Progressing   Problem: Coping: Goal: Will verbalize positive feelings about self Outcome: Progressing   Problem: Health Behavior/Discharge Planning: Goal: Ability to manage health-related needs will improve Outcome: Progressing   Problem: Nutrition: Goal: Risk of aspiration will decrease Outcome: Progressing

## 2022-03-30 NOTE — TOC Progression Note (Addendum)
Transition of Care [facility]) - Progression Note    Patient Details  Name: Artimus Gruver MRN: [id number] Date of Birth: [birth year]  [facility] of [facility] CM/SW [place], LCSW Phone Number: 03/30/2022, 9:10 AM  Clinical Narrative:   [facility] declined bed offer. Left voicemail for [facility] social worker to see if they are in network. Left message for [facility] admissions coordinator asking her to review.  11:02 am: [facility] is unable to offer a bed but [facility] is able to offer a bed as long as plan is only for short-term rehab. Daughter wants to see if anyone in [facility] can offer. Expanded search.  Expected Discharge Plan and Services                                               Social Determinants of Health (SDOH) Interventions SDOH Screenings   Tobacco Use: Medium Risk (03/25/2022)    Readmission Risk Interventions     No data to display

## 2022-03-30 NOTE — Progress Notes (Signed)
PROGRESS NOTE    Kevin Bradshaw  OFB:[id number] DOB: 12-Nov-1935 DOA: 03/24/2022  PCP: [name], MD   Brief Narrative: This 87 years old male with PMH significant for A-fib not on anticoagulation secondary to recurrent falls, hyperlipidemia, hypertension, Parkinson disease, history of CVA in 2019 who presented in the ED with acute onset of severe dysarthria, left hemineglect and left sensory deficit secondary to acute right parietal infarct.  He was admitted in the ICU s/p TNK, tolerated well. Etiology favored to be embolic given patient has a history of known paroxysmal A-fib but not on anticoagulation due to frequent falls.  He was seen by cardiology who plans to discuss with family whether he might be a candidate for short-term trial of anticoagulation with close follow-up with EP for consideration of watchman's device.  Patient underwent repeat MRI which did not show any increase in the size of infarct or hemorrhage.  Patient is started on aspirin,  Plavix and DVT prophylaxis. TRH pickup 03/26/2022.  Palliative care consulted, goals of care discussed.  CODE STATUS changed to DNR.  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  S/p TNKase Etiology likely to be embolic secondary to paroxysmal A-fib as patient is not on anticoagulation. Neurology consulted, repeat MRI showed no hemorrhage. Patient is started on aspirin , Plavix and DVT prophylaxis. Echo showed LVEF 60-65%, No RWMA.  No embolic source Continue neurochecks and NIHSS every shift. SCD for VTE prophylaxis. Repeat CT head for any change in neurological exam. If Anticoagulation is needed in future there is no neurological indication to continue antiplatelets. PT and OT recommended skilled nursing facility, TOC started authorization.  Persistent atrial fibrillation: Heart rate is well-controlled. Cardiology is consulted. Cardio recommended to continue metoprolol, echo showed normal LVEF. Cardiology plans to discuss with family whether he might be a candidate for short-term trial of anticoagulation with close follow-up with EP for consideration for watchman's device.  Anticoagulation would be discussed as an outpatient.  Recurrent falls: PT and OT recommended home health services but Family wants long-term placement. Continue fall precautions Repeat PT evaluation recommended SNF.  BPH: Patient denies any LUTS symptoms. Continue Proscar and Flomax.  Parkinson's disease: Patient not on any anti-Parkinson medications. Parkinson's medications has to be started outpatient.  Goals of care discussion: Palliative care consulted.  Goals of care discussed in detail.   Family and patient has decided DNR.  DVT prophylaxis: SCDs Code Status:DNR Family Communication: No family at bed side Disposition Plan:   Admitted for acute CVA requiring TNKase, tolerated well.  PT and OT recommended SNF Patient medically clear,  awaiting SNF placement  Consultants:  Neurology  Procedures: MRI Brain  Antimicrobials: None  Subjective: Patient was seen and examined at bedside.  Overnight events noted.   Patient reports doing much better.  He was sitting comfortably on the bed,  having breakfast. He is able to move all extremities. He is s/p TNKase for acute stroke.  Objective: Vitals:   03/30/22 0734 03/30/22 0837 03/30/22 0916 03/30/22 1110  BP: (!) 146/78 (!) 115/48 124/67 114/80  Pulse: 88 83  81  Resp: 18 18   19   Temp: 97.9 F (36.6 C) 97.8 F (36.6 C)  97.9 F (36.6 C)  TempSrc:  Oral  Oral  SpO2: 94% 90%  93%  Weight:      Height:        Intake/Output Summary (Last 24 hours) at 03/30/2022 1313 Last data filed at 03/30/2022 1056 Gross per 24 hour  Intake 360 ml  Output 400 ml  Net -40 ml   Filed Weights   03/24/22 1809 03/24/22 2013  Weight: 72.6 kg 69.9 kg    Examination:  General exam : Appears comfortable, NAD, deconditioned Respiratory system: CTA bilaterally, respiratory effort normal, RR 14. Cardiovascular system: S1 & S2 heard, irregular rhythm, no murmur. Gastrointestinal system: Abdomen is soft, non tender, non distended, BS+ Central nervous system: Alert and oriented x 3, no focal neurological deficits. Extremities: No edema, no cyanosis, no clubbing. Skin: No rashes, lesions or ulcers Psychiatry: Judgement and insight appear normal. Mood & affect appropriate.     Data Reviewed: I have personally reviewed following labs and imaging studies  CBC: Recent Labs  Lab 03/24/22 1229 03/25/22 0418 03/26/22 0448 03/27/22 0440 03/28/22 0347  WBC 4.2 6.5 4.7 5.1 4.4  NEUTROABS 2.2  --   --   --   --   HGB 12.9* 14.3 12.9* 14.0 13.3  HCT 40.5 45.0 40.6 43.8 41.2  MCV 94.8 93.8 92.9 94.0 92.4  PLT 152 170 150 158 0000000   Basic Metabolic Panel: Recent Labs  Lab 03/24/22 1229 03/25/22 0418 03/26/22 0448 03/27/22 0440 03/28/22 0347  NA 139 142 140 141 136  K 3.7 3.8 3.8 4.8 3.6  CL 106 107 107 105 104  CO2 26 27 27 29 26   GLUCOSE 143* 86 103* 105* 115*  BUN 12 10 12 13 21   CREATININE 1.14 0.99 0.96 0.93 0.91  CALCIUM 8.6* 9.0 8.6* 9.4 8.8*  MG  --  2.1 1.9 1.9  --   PHOS  --  3.2 3.2 3.6 4.4   GFR: Estimated Creatinine Clearance: 51.9 mL/min (by C-G formula based on SCr of 0.91 mg/dL). Liver Function Tests: Recent Labs  Lab 03/24/22 1229 03/26/22 0448 03/27/22 0440 03/28/22 0347  AST 28  --   --   --   ALT 19  --   --   --   ALKPHOS 88  --   --    --   BILITOT 0.8  --   --   --   PROT 6.7  --   --   --   ALBUMIN 3.3* 3.1* 3.1* 3.0*   No results for input(s): "LIPASE", "AMYLASE" in the last 168 hours. No results for input(s): "AMMONIA" in the last 168 hours. Coagulation Profile: Recent Labs  Lab 03/24/22 1229  INR 1.0   Cardiac Enzymes: No results for input(s): "CKTOTAL", "CKMB", "CKMBINDEX", "TROPONINI" in the last 168 hours. BNP (last 3 results) No results for input(s): "PROBNP" in the last 8760 hours. HbA1C: No results for input(s): "HGBA1C" in the last 72 hours.  CBG: Recent Labs  Lab 03/26/22 0037 03/26/22 0503 03/26/22 0808 03/26/22 1143 03/26/22 1632  GLUCAP 129* 100* 98 121* 120*   Lipid Profile: No results for input(s): "CHOL", "HDL", "LDLCALC", "TRIG", "CHOLHDL", "LDLDIRECT" in the last 72 hours.  Thyroid Function Tests: No results for input(s): "TSH", "T4TOTAL", "FREET4", "T3FREE", "THYROIDAB" in the last 72 hours.  Anemia Panel: No results  for input(s): "VITAMINB12", "FOLATE", "FERRITIN", "TIBC", "IRON", "RETICCTPCT" in the last 72 hours. Sepsis Labs: No results for input(s): "PROCALCITON", "LATICACIDVEN" in the last 168 hours.  Recent Results (from the past 240 hour(s))  MRSA Next Gen by PCR, Nasal     Status: Abnormal   Collection Time: 03/24/22  8:16 PM   Specimen: Nasal Mucosa; Nasal Swab  Result Value Ref Range Status   MRSA by PCR Next Gen DETECTED (A) NOT DETECTED Final    Comment: RESULT CALLED TO, READ BACK BY AND VERIFIED WITH: [facility] MOORE 03/24/22 2208 MU (NOTE) The GeneXpert MRSA Assay (FDA approved for NASAL specimens only), is one component of a comprehensive MRSA colonization surveillance program. It is not intended to diagnose MRSA infection nor to guide or monitor treatment for MRSA infections. Test performance is not FDA approved in patients less than 69 years old. Performed at [facility], [address] [place], [place] [address]     Radiology Studies: No  results found.  Scheduled Meds:  aspirin EC  81 mg Oral Daily   atorvastatin  80 mg Oral QHS   Chlorhexidine Gluconate Cloth  6 each Topical Daily   clopidogrel  75 mg Oral Daily   enoxaparin (LOVENOX) injection  40 mg Subcutaneous Q24H   ezetimibe  10 mg Oral Daily   feeding supplement  237 mL Oral TID BM   finasteride  5 mg Oral Daily   metoprolol succinate  25 mg Oral Daily   multivitamin with minerals  1 tablet Oral Daily   pantoprazole  40 mg Oral QHS   tamsulosin  0.4 mg Oral Q supper   Continuous Infusions:   LOS: 6 days    Time spent: 35 mins    Duard Brady, MD [facility]   If 7PM-7AM, please contact night-coverage

## 2022-03-30 NOTE — Telephone Encounter (Signed)
LVM to confirm 5/1 is his appointment with Dr. Quentin Ore

## 2022-03-30 NOTE — Progress Notes (Signed)
Physical Therapy Treatment Patient Details Name: Ell Bamberger MRN: [id number] DOB: [birth year] Today's Date: 03/30/2022   History of Present Illness Pt is an 87 yo man with hx a fib not on anticoagulation 2/2 frequent falls, HL, HTN, Parkinson disease, stroke 2019 who presents with acute onset severe dysarthria, L hemineglect, and L sensory deficit. NIHSS = 7. CT head showed acute R parietal infarct ASPECTS 8. Patient was mildly confused and had difficulty following simple commands. Patient is s/p TNK    PT Comments    Pt received in bed, agreed to PT session. MinA for bed mobility with HOB raised, as well as transfers due to struggle coordinating task. Pt completed gait training with RW ~75 with MinA requiring almost 20 minutes to complete due to continuous shuffling, freezing spells, and initiating tasks due to Parkinsons. Pt unfortunately will need to wait to have this addressed as an out-pt.   Recommendations for follow up therapy are one component of a multi-disciplinary discharge planning process, led by the attending physician.  Recommendations may be updated based on patient status, additional functional criteria and insurance authorization.  Follow Up Recommendations  Skilled nursing-short term rehab (<3 hours/day) Can patient physically be transported by private vehicle: Yes   Assistance Recommended at Discharge Frequent or constant Supervision/Assistance  Patient can return home with the following A little help with walking and/or transfers;A little help with bathing/dressing/bathroom;Help with stairs or ramp for entrance;Assist for transportation;Assistance with cooking/housework;Direct supervision/assist for medications management   Equipment Recommendations  None recommended by PT    Recommendations for Other Services       Precautions / Restrictions Precautions Precautions: Fall Precaution Comments: frequent falls at home, uses w/c a lot Restrictions Weight Bearing  Restrictions: No     Mobility  Bed Mobility Overal bed mobility: Needs Assistance Bed Mobility: Supine to Sit     Supine to sit: HOB elevated, Min guard          Transfers Overall transfer level: Needs assistance Equipment used: Rolling walker (2 wheels) Transfers: Sit to/from Stand Sit to Stand: Min assist                Ambulation/Gait Ambulation/Gait assistance: Herbalist (Feet):  (75) Assistive device: Rolling walker (2 wheels) Gait Pattern/deviations: Shuffle, Decreased step length - right, Decreased step length - left, Drifts right/left Gait velocity: decr     General Gait Details: Parkinsonian gait with frequent stops and difficulty initiating   Stairs             Wheelchair Mobility    Modified Rankin (Stroke Patients Only)       Balance Overall balance assessment: History of Falls, Needs assistance Sitting-balance support: Feet supported Sitting balance-Leahy Scale: Good     Standing balance support: Bilateral upper extremity supported, During functional activity, Reliant on assistive device for balance Standing balance-Leahy Scale: Fair                              Cognition Arousal/Alertness: Awake/alert Behavior During Therapy: WFL for tasks assessed/performed Overall Cognitive Status: Within Functional Limits for tasks assessed                                 General Comments: decreased safety awareness at times        Exercises General Exercises - Lower Extremity Ankle Circles/Pumps: AROM, Both, 5 reps Long  Arc Quad: AROM, Both, 10 reps Hip Flexion/Marching: AROM, Both, 10 reps, Seated    General Comments        Pertinent Vitals/Pain Pain Assessment Pain Assessment: No/denies pain    Home Living                          Prior Function            PT Goals (current goals can now be found in the care plan section) Acute Rehab PT Goals Patient Stated Goal: to  return home    Frequency    7X/week      PT Plan Discharge plan needs to be updated    Co-evaluation              AM-PAC PT "6 Clicks" Mobility   Outcome Measure  Help needed turning from your back to your side while in a flat bed without using bedrails?: A Lot Help needed moving from lying on your back to sitting on the side of a flat bed without using bedrails?: A Lot Help needed moving to and from a bed to a chair (including a wheelchair)?: A Little Help needed standing up from a chair using your arms (e.g., wheelchair or bedside chair)?: A Little Help needed to walk in hospital room?: A Little Help needed climbing 3-5 steps with a railing? : A Lot 6 Click Score: 15    End of Session Equipment Utilized During Treatment: Gait belt Activity Tolerance: Patient tolerated treatment well Patient left: in chair;with call bell/phone within reach;with chair alarm set;with family/visitor present Nurse Communication: Mobility status PT Visit Diagnosis: Unsteadiness on feet (R26.81);Difficulty in walking, not elsewhere classified (R26.2);Repeated falls (R29.6)     Time: [id number] PT Time Calculation (min) (ACUTE ONLY): 27 min  Charges:  $Gait Training: 8-22 mins $Therapeutic Exercise: 8-22 mins                    Mikel Cella, PTA  Josie Dixon 03/30/2022, 2:48 PM

## 2022-03-30 NOTE — Telephone Encounter (Signed)
-----   Message from Braulio Conte V sent at 03/29/2022  1:59 PM EDT ----- Regarding: FW: hosp follow-up  ----- Message ----- From: Antony Madura, PA-C Sent: 03/26/2022  11:18 AM EDT To: [facility] Triage; [facility] Scheduling Subject: hosp follow-up                                 Pt needs EP follow-up with Quentin Ore in 3-4 weeks for watchman consideration. Thanks!

## 2022-03-31 DIAGNOSIS — F02B0 Dementia in other diseases classified elsewhere, moderate, without behavioral disturbance, psychotic disturbance, mood disturbance, and anxiety: Secondary | ICD-10-CM

## 2022-03-31 DIAGNOSIS — I4819 Other persistent atrial fibrillation: Secondary | ICD-10-CM

## 2022-03-31 DIAGNOSIS — G20A1 Parkinson's disease without dyskinesia, without mention of fluctuations: Secondary | ICD-10-CM | POA: Diagnosis not present

## 2022-03-31 DIAGNOSIS — R471 Dysarthria and anarthria: Secondary | ICD-10-CM | POA: Diagnosis not present

## 2022-03-31 DIAGNOSIS — Z9181 History of falling: Secondary | ICD-10-CM

## 2022-03-31 DIAGNOSIS — I639 Cerebral infarction, unspecified: Secondary | ICD-10-CM | POA: Diagnosis not present

## 2022-03-31 NOTE — TOC Progression Note (Addendum)
Transition of Care [facility]) - Progression Note    Patient Details  Name: Kevin Bradshaw MRN: [id number] Date of Birth: [birth year]  Transition of Care [facility]) CM/SW [place], LCSW Phone Number: 03/31/2022, 11:26 AM  Clinical Narrative:   Reviewed bed offers with daughter. She wants to see if [facility] can offer. Left voicemail for admissions coordinator. Encouraged daughter to review current offers and determine preference out of those in case [facility] cannot offer.  11:51 am: Notified daughter that [facility] cannot offer a bed. She will review current offers and call CSW back with decision.  12:53 pm: Daughter has accepted bed offer from [facility] in [place]. Per attending, likely discharge tomorrow or Friday. Christine with admissions is aware. Sent secure chat to [place] requesting that she start insurance authorization. She is requesting EMS transport and is aware he will likely receive a bill.  1:47 pm: Auth approved: [id number]. Valid 3/21-4/2. Left message for Altha Harm to notify.  Expected Discharge Plan and Services                                               Social Determinants of Health (SDOH) Interventions SDOH Screenings   Tobacco Use: Medium Risk (03/25/2022)    Readmission Risk Interventions     No data to display

## 2022-03-31 NOTE — Progress Notes (Signed)
Physical Therapy Treatment Patient Details Name: Kevin Bradshaw MRN: [id number] DOB: 1935-08-23 Today's Date: 03/31/2022   History of Present Illness Pt is an 87 yo man with hx a fib not on anticoagulation 2/2 frequent falls, HL, HTN, Parkinson disease, stroke 2019 who presents with acute onset severe dysarthria, L hemineglect, and L sensory deficit. NIHSS = 7. CT head showed acute R parietal infarct ASPECTS 8. Patient was mildly confused and had difficulty following simple commands. Patient is s/p TNK    PT Comments    Pt was supine in bed laying in the nude upon arrival. He is A and O x 3. Poor insight of deficits with poor overall safety awareness noted. He is extremely cooperative and pleasant. " I want to go home." Discussed concerns and current medical situation. He is agreeable to rehab to improve safe functional mobility prior to returning home. Pt requires increased time for all mobility, transfers, and gait. During ambulation, pt presents with parkinsonian gait. Shuffling and freezing noted several times with assistance required to re-initiate movements. Pt HR peaked at 112 BPM with pt endorses feeling well. Author highly recommends DC to STR to maximize his independence and safety prior with all ADLs. Acute PT will continue to follow per current POC.     Recommendations for follow up therapy are one component of a multi-disciplinary discharge planning process, led by the attending physician.  Recommendations may be updated based on patient status, additional functional criteria and insurance authorization.  Follow Up Recommendations  Skilled nursing-short term rehab (<3 hours/day)     Assistance Recommended at Discharge Frequent or constant Supervision/Assistance  Patient can return home with the following A little help with walking and/or transfers;A little help with bathing/dressing/bathroom;Help with stairs or ramp for entrance;Assist for transportation;Assistance with  cooking/housework;Direct supervision/assist for medications management   Equipment Recommendations  None recommended by PT (Defer to next level of care)       Precautions / Restrictions Precautions Precautions: Fall Precaution Comments: frequent falls at home, uses w/c a lot Restrictions Weight Bearing Restrictions: No     Mobility  Bed Mobility Overal bed mobility: Needs Assistance Bed Mobility: Supine to Sit  Supine to sit: HOB elevated, Min guard  General bed mobility comments: increased time to perform with heavy use of bed rail. pt rolled R to short sit. Sat EOB x several minutes prior to standing. close supervision while sitting EOB    Transfers Overall transfer level: Needs assistance Equipment used: Rolling walker (2 wheels) Transfers: Sit to/from Stand Sit to Stand: Min assist  General transfer comment: Min assist to stand from EOB and from recliner. vcs for handplacement and improved fwd wt shift    Ambulation/Gait Ambulation/Gait assistance: Min assist, Mod assist Gait Distance (Feet): 100 Feet Assistive device: Rolling walker (2 wheels) Gait Pattern/deviations: Shuffle, Decreased step length - right, Decreased step length - left, Drifts right/left Gait velocity: decr  General Gait Details: Parkinsonian gait with frequent stops and difficulty initiating    Balance Overall balance assessment: History of Falls, Needs assistance Sitting-balance support: Feet supported Sitting balance-Leahy Scale: Good     Standing balance support: Bilateral upper extremity supported, During functional activity, Reliant on assistive device for balance Standing balance-Leahy Scale: Fair Standing balance comment: pt is high fall risk during all standing activity. freezing and shuffling noted. needs assistance to re-initiate movements at times       Cognition Arousal/Alertness: Awake/alert Behavior During Therapy: [facility] for tasks assessed/performed Overall Cognitive Status: Within  Functional Limits for  tasks assessed      General Comments: Pt is A and O x 3. poor safety awareness with poor insight of deficits               Pertinent Vitals/Pain Pain Assessment Pain Assessment: No/denies pain     PT Goals (current goals can now be found in the care plan section) Acute Rehab PT Goals Patient Stated Goal: go home Progress towards PT goals: Progressing toward goals    Frequency    7X/week      PT Plan Current plan remains appropriate       AM-PAC PT "6 Clicks" Mobility   Outcome Measure  Help needed turning from your back to your side while in a flat bed without using bedrails?: A Little Help needed moving from lying on your back to sitting on the side of a flat bed without using bedrails?: A Lot Help needed moving to and from a bed to a chair (including a wheelchair)?: A Little Help needed standing up from a chair using your arms (e.g., wheelchair or bedside chair)?: A Little Help needed to walk in hospital room?: A Lot Help needed climbing 3-5 steps with a railing? : A Lot 6 Click Score: 15    End of Session Equipment Utilized During Treatment: Gait belt Activity Tolerance: Patient tolerated treatment well Patient left: in chair;with call bell/phone within reach;with chair alarm set;with family/visitor present Nurse Communication: Mobility status PT Visit Diagnosis: Unsteadiness on feet (R26.81);Difficulty in walking, not elsewhere classified (R26.2);Repeated falls (R29.6)     Time: [id number] PT Time Calculation (min) (ACUTE ONLY): 25 min  Charges:  $Gait Training: 8-22 mins $Therapeutic Activity: 8-22 mins                     Julaine Fusi PTA 03/31/22, 8:30 AM

## 2022-03-31 NOTE — Progress Notes (Signed)
CCMD report to this RN frequent cardiac monitoring removal.  Dr Sheppard Coil notified of cardiac SR with BBB, HR in 90's while DNR orders in place and unable to maintain intact cardiac monitoring.   MD orders to d/c cardiac monitoring.

## 2022-03-31 NOTE — Hospital Course (Addendum)
This 87 years old male with PMH significant for A-fib not on anticoagulation secondary to recurrent falls, hyperlipidemia, hypertension, Parkinson disease, history of CVA in 2019 who presented in the ED with acute onset of severe dysarthria, left hemineglect and left sensory deficit secondary to acute right parietal infarct.  He was admitted in the ICU s/p TNK, tolerated well. Etiology favored to be embolic given patient has a history of known paroxysmal A-fib but not on anticoagulation due to frequent falls.  He was seen by cardiology who plans to discuss with family whether he might be a candidate for short-term trial of anticoagulation with close follow-up with EP for consideration of watchman's device.  Patient underwent repeat MRI which did not show any increase in the size of infarct or hemorrhage.  Patient is started on aspirin,  Plavix and DVT prophylaxis. TRH pickup 03/26/2022.  Palliative care consulted, goals of care discussed.  CODE STATUS changed to DNR. Has been stable awaiting SNF rehab placement. 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 Neurology consulted, repeat MRI showed no hemorrhage. Echo showed LVEF 60-65%, No RWMA. No embolic source continue on aspirin , Plavix - follow neurology outpatient  If anticoagulation is trialed in the future there is no neurologic indication to continue antiplatelets. In the absence of cardiac indication to continue  aspirin please d/c antiplatelets if patient is started on anticoagulation.   Persistent atrial fibrillation: Heart rate is well-controlled.  echo showed normal LVEF continue metoprolol follow-up with EP for consideration for watchman's device.  Anticoagulation would be discussed as an outpatient.   Recurrent falls: Continue fall precautions PT evaluation recommended SNF.   BPH: Continue Proscar and Flomax.   Parkinson's disease: Patient not on any anti-Parkinson medications. Parkinson's medications may be started outpatient.   Goals of care discussion: Palliative care consulted.  Goals of care discussed in detail.   Family and patient has decided DNR.      DVT prophylaxis: SCD Pertinent IV fluids/nutrition: no continuous IV fluids  Central lines / invasive devices: none  Code Status: DNR  Current Admission Status: inpatient   TOC needs / Dispo plan: SNF rehab Barriers to discharge / significant pending items: pending placement

## 2022-03-31 NOTE — Progress Notes (Signed)
PROGRESS NOTE    Kevin Bradshaw   [id number] DOB: [birth year]  DOA: 03/24/2022 Date of Service: 03/31/22 PCP: [name], MD     Brief Narrative / Hospital Course:  This 87 years old male with PMH significant for A-fib not on anticoagulation secondary to recurrent falls, hyperlipidemia, hypertension, Parkinson disease, history of CVA in 2019 who presented in the ED with acute onset of severe dysarthria, left hemineglect and left sensory deficit secondary to acute right parietal infarct.  He was admitted in the ICU s/p TNK, tolerated well. Etiology favored to be embolic given patient has a history of known paroxysmal A-fib but not on anticoagulation due to frequent falls.  He was seen by cardiology who plans to discuss with family whether he might be a candidate for short-term trial of anticoagulation with close follow-up with EP for consideration of watchman's device.  Patient underwent repeat MRI which did not show any increase in the size of infarct or hemorrhage.  Patient is started on aspirin,  Plavix and DVT prophylaxis. TRH pickup 03/26/2022.  Palliative care consulted, goals of care discussed.  CODE STATUS changed to DNR. Has been stable awaiting SNF rehab placement.   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 Neurology consulted, repeat MRI showed no hemorrhage. Echo showed LVEF 60-65%, No RWMA. No embolic source continue on aspirin , Plavix  Continue neurochecks and NIHSS every shift. SCD for VTE  prophylaxis. Repeat CT head for any change in neurological exam. If Anticoagulation is needed in future, there is no neurological indication to continue antiplatelets. PT and OT recommended skilled nursing facility, TOC started authorization.   Persistent atrial fibrillation: Heart rate is well-controlled.  echo showed normal LVEF continue metoprolol,  Cardiology to discuss with family whether he might be a candidate for short-term trial of anticoagulation with close follow-up with EP for consideration for watchman's device.  Anticoagulation would be discussed as an outpatient.   Recurrent falls: Continue fall precautions PT evaluation recommended SNF.   BPH: Continue Proscar and Flomax.   Parkinson's disease: Patient not on any anti-Parkinson medications. Parkinson's medications has to be started outpatient.   Goals of care discussion: Palliative care consulted.  Goals of care discussed in detail.   Family and patient has decided DNR.      DVT prophylaxis: SCD Pertinent IV fluids/nutrition: no continuous IV fluids  Central lines / invasive devices: none  Code Status: DNR  Current Admission Status: inpatient   TOC needs / Dispo plan: SNF rehab Barriers to discharge / significant pending items: pending placement             Subjective / Brief ROS:  Patient reports doing well today, no concerns Denies CP/SOB.  Pain controlled.  Denies new weakness.  Tolerating diet.  Reports no concerns w/ urination/defecation.   Family Communication: TOC in communication w/ family, no medical updates today     Objective Findings:  Vitals:   03/30/22 2359 03/31/22 0419 03/31/22 0801 03/31/22 1108  BP: 117/70 122/68 119/89 127/66  Pulse: 87 84 89  91  Resp: 18 20 18 18   Temp: 97.9 F (36.6 C) 98.2 F (36.8 C) (!) 97.5 F (36.4 C) 98.5 F (36.9 C)  TempSrc:  Oral  Oral  SpO2: 97% 96% 95% 100%  Weight:      Height:        Intake/Output Summary (Last 24 hours)  at 03/31/2022 1422 Last data filed at 03/31/2022 1231 Gross per 24 hour  Intake 720 ml  Output 600 ml  Net 120 ml   Filed Weights   03/24/22 1809 03/24/22 2013  Weight: 72.6 kg 69.9 kg    Examination:  Physical Exam Constitutional:      General: He is not in acute distress.    Appearance: Normal appearance.  Cardiovascular:     Rate and Rhythm: Normal rate and regular rhythm.  Pulmonary:     Effort: Pulmonary effort is normal. No respiratory distress.     Breath sounds: Normal breath sounds.  Abdominal:     General: Abdomen is flat.  Musculoskeletal:     Right lower leg: No edema.     Left lower leg: No edema.  Skin:    General: Skin is warm and dry.  Neurological:     General: No focal deficit present.     Mental Status: He is alert.  Psychiatric:        Mood and Affect: Mood normal.        Behavior: Behavior normal.          Scheduled Medications:   aspirin EC  81 mg Oral Daily   atorvastatin  80 mg Oral QHS   Chlorhexidine Gluconate Cloth  6 each Topical Daily   clopidogrel  75 mg Oral Daily   enoxaparin (LOVENOX) injection  40 mg Subcutaneous Q24H   ezetimibe  10 mg Oral Daily   feeding supplement  237 mL Oral TID BM   finasteride  5 mg Oral Daily   metoprolol succinate  25 mg Oral Daily   multivitamin with minerals  1 tablet Oral Daily   pantoprazole  40 mg Oral QHS   tamsulosin  0.4 mg Oral Q supper    Continuous Infusions:   PRN Medications:  acetaminophen **OR** acetaminophen (TYLENOL) oral liquid 160 mg/5 mL **OR** acetaminophen, labetalol, senna-docusate  Antimicrobials from admission:  Anti-infectives (From admission, onward)    None           Data Reviewed:  I have personally reviewed the following...  CBC: Recent Labs  Lab 03/25/22 0418 03/26/22 0448 03/27/22 0440 03/28/22 0347  WBC 6.5 4.7 5.1 4.4  HGB 14.3 12.9* 14.0 13.3  HCT 45.0 40.6 43.8 41.2  MCV 93.8 92.9 94.0 92.4  PLT 170 150 158 0000000   Basic Metabolic  Panel: Recent Labs  Lab 03/25/22 0418 03/26/22 0448 03/27/22 0440 03/28/22 0347  NA 142 140 141 136  K 3.8 3.8 4.8 3.6  CL 107 107 105 104  CO2 27 27 29 26   GLUCOSE 86 103* 105* 115*  BUN 10 12 13 21   CREATININE 0.99 0.96 0.93 0.91  CALCIUM 9.0 8.6* 9.4 8.8*  MG 2.1 1.9 1.9  --   PHOS 3.2 3.2 3.6 4.4   GFR: Estimated Creatinine Clearance: 51.9 mL/min (by C-G formula based on SCr of 0.91 mg/dL). Liver Function Tests: Recent Labs  Lab 03/26/22 0448 03/27/22 0440 03/28/22 0347  ALBUMIN 3.1* 3.1* 3.0*   No results for input(s): "LIPASE", "AMYLASE" in the last 168 hours. No results for input(s): "AMMONIA" in the  last 168 hours. Coagulation Profile: No results for input(s): "INR", "PROTIME" in the last 168 hours. Cardiac Enzymes: No results for input(s): "CKTOTAL", "CKMB", "CKMBINDEX", "TROPONINI" in the last 168 hours. BNP (last 3 results) No results for input(s): "PROBNP" in the last 8760 hours. HbA1C: No results for input(s): "HGBA1C" in the last 72 hours. CBG: Recent Labs  Lab 03/26/22 0037 03/26/22 0503 03/26/22 0808 03/26/22 1143 03/26/22 1632  GLUCAP 129* 100* 98 121* 120*   Lipid Profile: No results for input(s): "CHOL", "HDL", "LDLCALC", "TRIG", "CHOLHDL", "LDLDIRECT" in the last 72 hours. Thyroid Function Tests: No results for input(s): "TSH", "T4TOTAL", "FREET4", "T3FREE", "THYROIDAB" in the last 72 hours. Anemia Panel: No results for input(s): "VITAMINB12", "FOLATE", "FERRITIN", "TIBC", "IRON", "RETICCTPCT" in the last 72 hours. Most Recent Urinalysis On File:     Component Value Date/Time   COLORURINE YELLOW (A) 10/02/2018 1756   APPEARANCEUR TURBID (A) 10/02/2018 1756   LABSPEC 1.021 10/02/2018 1756   PHURINE 5.0 10/02/2018 1756   GLUCOSEU NEGATIVE 10/02/2018 1756   HGBUR LARGE (A) 10/02/2018 1756   BILIRUBINUR NEGATIVE 10/02/2018 1756   KETONESUR 5 (A) 10/02/2018 1756   PROTEINUR 100 (A) 10/02/2018 1756   NITRITE POSITIVE (A) 10/02/2018 1756    LEUKOCYTESUR MODERATE (A) 10/02/2018 1756   Sepsis Labs: @LABRCNTIP (procalcitonin:4,lacticidven:4) Microbiology: Recent Results (from the past 240 hour(s))  MRSA Next Gen by PCR, Nasal     Status: Abnormal   Collection Time: 03/24/22  8:16 PM   Specimen: Nasal Mucosa; Nasal Swab  Result Value Ref Range Status   MRSA by PCR Next Gen DETECTED (A) NOT DETECTED Final    Comment: RESULT CALLED TO, READ BACK BY AND VERIFIED WITH: [facility] MOORE 03/24/22 2208 MU (NOTE) The GeneXpert MRSA Assay (FDA approved for NASAL specimens only), is one component of a comprehensive MRSA colonization surveillance program. It is not intended to diagnose MRSA infection nor to guide or monitor treatment for MRSA infections. Test performance is not FDA approved in patients less than 28 years old. Performed at [facility], [address] [place], [place] [address]       Radiology Studies last 3 days: No results found.           LOS: 7 days    Emeterio Reeve, DO [facility] 03/31/2022, 2:22 PM    Dictation software may have been used to generate the above note. Typos may occur and escape review in typed/dictated notes. Please contact Dr Sheppard Coil directly for clarity if needed.  Staff may message me via secure chat in [place]  but this may not receive an immediate response,  please page me for urgent matters!  If 7PM-7AM, please contact night coverage [email or web address]

## 2022-03-31 NOTE — Progress Notes (Signed)
Occupational Therapy Treatment Patient Details Name: Kevin Bradshaw MRN: [id number] DOB: Nov 05, 1935 Today's Date: 03/31/2022   History of present illness Pt is an 87 yo man with hx a fib not on anticoagulation 2/2 frequent falls, HL, HTN, Parkinson disease, stroke 2019 who presents with acute onset severe dysarthria, L hemineglect, and L sensory deficit. NIHSS = 7. CT head showed acute R parietal infarct ASPECTS 8. Patient was mildly confused and had difficulty following simple commands. Patient is s/p TNK   OT comments  Upon entering the room, pt seated in recliner chair and agreeable to OT intervention. Pt reports need to attempt BM. Pt stands from recliner chair with mod lifting assistance and cuing for hand placement and technique. Pt needing min - mod A to ambulate 8' with RW to [facility]. Pt having significant difficulty with turns and backwards steps requiring increased assistance secondary to posterior bias. Pt seated on BSC but unable to void. While seated, pt performs grooming tasks with set up A to obtain all needed items. Pt returns to recliner chair at end of session in same manner as above. Chair alarm activated for safety.    Recommendations for follow up therapy are one component of a multi-disciplinary discharge planning process, led by the attending physician.  Recommendations may be updated based on patient status, additional functional criteria and insurance authorization.    Follow Up Recommendations  Skilled nursing-short term rehab (<3 hours/day)     Assistance Recommended at Discharge Intermittent Supervision/Assistance  Patient can return home with the following  A little help with walking and/or transfers;A little help with bathing/dressing/bathroom;Help with stairs or ramp for entrance;Assist for transportation;Assistance with cooking/housework   Equipment Recommendations  BSC/3in1       Precautions / Restrictions Precautions Precautions: Fall Precaution Comments:  frequent falls at home, uses w/c a lot Restrictions Weight Bearing Restrictions: No       Mobility Bed Mobility               General bed mobility comments: seated in recliner chair at beginning/end of session    Transfers Overall transfer level: Needs assistance Equipment used: Rolling walker (2 wheels) Transfers: Sit to/from Stand Sit to Stand: Min assist                 Balance Overall balance assessment: History of Falls, Needs assistance Sitting-balance support: Feet supported Sitting balance-Leahy Scale: Good     Standing balance support: Bilateral upper extremity supported, During functional activity, Reliant on assistive device for balance Standing balance-Leahy Scale: Fair                             ADL either performed or assessed with clinical judgement   ADL Overall ADL's : Needs assistance/impaired                         Toilet Transfer: Rolling walker (2 wheels);Ambulation;BSC/3in1;Moderate assistance   Toileting- Clothing Manipulation and Hygiene: Minimal assistance       Functional mobility during ADLs: Rolling walker (2 wheels);Cueing for sequencing;Cueing for safety;Minimal assistance      Extremity/Trunk Assessment Upper Extremity Assessment Upper Extremity Assessment: Generalized weakness   Lower Extremity Assessment Lower Extremity Assessment: Generalized weakness        Vision Baseline Vision/History: 1 Wears glasses Patient Visual Report: No change from baseline            Cognition Arousal/Alertness: Awake/alert Behavior During Therapy:  WFL for tasks assessed/performed Overall Cognitive Status: Within Functional Limits for tasks assessed                                 General Comments: Pt is A and O x 3. poor safety awareness with poor insight of deficits                   Pertinent Vitals/ Pain       Pain Assessment Pain Assessment: No/denies pain          Frequency  Min 2X/week        Progress Toward Goals  OT Goals(current goals can now be found in the care plan section)  Progress towards OT goals: Progressing toward goals  Acute Rehab OT Goals Patient Stated Goal: to return home OT Goal Formulation: With patient Time For Goal Achievement: 04/09/22 Potential to Achieve Goals: Good  Plan Frequency remains appropriate;Discharge plan remains appropriate       AM-PAC OT "6 Clicks" Daily Activity     Outcome Measure   Help from another person eating meals?: None Help from another person taking care of personal grooming?: A Little Help from another person toileting, which includes using toliet, bedpan, or urinal?: A Little Help from another person bathing (including washing, rinsing, drying)?: A Little Help from another person to put on and taking off regular upper body clothing?: A Little Help from another person to put on and taking off regular lower body clothing?: A Little 6 Click Score: 19    End of Session Equipment Utilized During Treatment: Rolling walker (2 wheels)      Activity Tolerance Patient tolerated treatment well   Patient Left in chair;with call bell/phone within reach;with chair alarm set   Nurse Communication Mobility status        Time: [id number] OT Time Calculation (min): 20 min  Charges: OT General Charges $OT Visit: 1 Visit OT Treatments $Self Care/Home Management : 8-22 mins  Darleen Crocker, MS, OTR/L , CBIS ascom [phone]  03/31/22, 10:46 AM

## 2022-04-01 DIAGNOSIS — G20A1 Parkinson's disease without dyskinesia, without mention of fluctuations: Secondary | ICD-10-CM | POA: Diagnosis not present

## 2022-04-01 DIAGNOSIS — R471 Dysarthria and anarthria: Secondary | ICD-10-CM | POA: Diagnosis not present

## 2022-04-01 DIAGNOSIS — I4819 Other persistent atrial fibrillation: Secondary | ICD-10-CM | POA: Diagnosis not present

## 2022-04-01 DIAGNOSIS — I639 Cerebral infarction, unspecified: Secondary | ICD-10-CM | POA: Diagnosis not present

## 2022-04-01 MED ORDER — SENNOSIDES-DOCUSATE SODIUM 8.6-50 MG PO TABS: 1.0000 | ORAL_TABLET | Freq: Every evening | ORAL | Status: DC | PRN

## 2022-04-01 MED ORDER — ADULT MULTIVITAMIN W/MINERALS CH: 1.0000 | ORAL_TABLET | Freq: Every day | ORAL | Status: DC

## 2022-04-01 MED ORDER — ENSURE ENLIVE PO LIQD: 237.0000 mL | Freq: Three times a day (TID) | ORAL | 12 refills | Status: DC

## 2022-04-01 MED ORDER — PANTOPRAZOLE SODIUM 40 MG PO TBEC: 40.0000 mg | DELAYED_RELEASE_TABLET | Freq: Every day | ORAL | 0 refills | Status: DC

## 2022-04-01 MED ORDER — EZETIMIBE 10 MG PO TABS: 10.0000 mg | ORAL_TABLET | Freq: Every day | ORAL | 0 refills | Status: DC

## 2022-04-01 MED ORDER — METOPROLOL SUCCINATE ER 25 MG PO TB24: 25.0000 mg | ORAL_TABLET | Freq: Every day | ORAL | 0 refills | Status: DC

## 2022-04-01 NOTE — Progress Notes (Signed)
Speech Language Pathology Treatment: Cognitive-Linquistic  Patient Details Name: Kevin Bradshaw MRN: [id number] DOB: 1935-05-24 Today's Date: 04/01/2022 Time: 0900-0920 SLP Time Calculation (min) (ACUTE ONLY): 20 min  Assessment / Plan / Recommendation Clinical Impression  Pt seen today for dysarthria tx. Pt alert, cooperative, and pleasant t/o tx. Pt sitting up in chair beside bed w/ breakfast tray in front of him upon ST arrival.   Pt continues to present with a mild/moderate dysarthria most c/w ataxic type. Speech is c/b irregular rate/rhythm and articulatory imprecision. Pt approx 80% intelligible without known context. Noted this presentation is a slight improvement from previous sessions c/b increased articulatory precision and potential impact of increased breath support d/t sitting up in chair. Reviewed compensatory strateges for dysarthria including slowed speech rate and increased vocal loudness. Pt required min/mod cues for use during informal conversational exchanges. Noted pt's speech intelligibility improved to 100% when using strategies.   Recommend continued SLP services acute and post-acute for dysarthria. SLP to continue to f/u while pt in house.   HPI HPI: Per H&P, pt " is a 87 y.o. male with a hx of hypertension, hyperlipidemia, Parkinson's disease, recurrent falls, paroxysmal A-fib and history of stroke . Pt had been in his normal state of health during the day, his official LKW was around 1400 when his son-in-law saw him outside in his wheelchair getting the mail, eating and drinking a beer. Later on his son-in-law saw him lethargic in the kitchen, which is not unusual for the patient who can get fatigued throughout the day. He asked him if he wanted to go to bed, and the patient seemed to mumble no.  The son in law returned outside, then a little while later emergency services arrived at the house responding to a Life alert activation. At some point the patient had pushed his  life alert button. When his son-in-law went into the house he noticed the patient developed acute severe dysarthria, L hemineglect, and L sensory deficit.      Family confirms baseline confusion, reporting he knows his name and his family members but is normally disoriented to time, situation, and place but can wax and wane. He has frequent falls, 2 weeks ago he fell 4 times. He is mostly wheelchair bound, but is able to walk some short distances. He can get confused with how to operate his WC. He feeds himself with difficulty due to a baseline tremor, but is independent in other ADL's such as dressing, bathing, toileting.  Family denies any recent complaints or symptoms of illness. He missed his AM dose of his BB, but has otherwise been taking his medication as prescribed. Upon arrival patient mildly confused with difficulty following commands and unable to provide a clear history."      SLP Plan  Continue with current plan of care      Recommendations for follow up therapy are one component of a multi-disciplinary discharge planning process, led by the attending physician.  Recommendations may be updated based on patient status, additional functional criteria and insurance authorization.    Recommendations                   Oral Care Recommendations: Oral care BID;Oral care before and after PO Follow Up Recommendations: Skilled nursing-short term rehab (<3 hours/day) Assistance recommended at discharge: Frequent or constant Supervision/Assistance SLP Visit Diagnosis: Dysarthria and anarthria (R47.1) Plan: Continue with current plan of care         [place]  [facility], Speech Pathology   Randall Hiss  04/01/2022, 9:52 AM

## 2022-04-01 NOTE — Progress Notes (Signed)
Report called to [facility].  Report given to April RN.

## 2022-04-01 NOTE — TOC Transition Note (Addendum)
Transition of Care [facility]) - CM/SW Discharge Note   Patient Details  Name: Kevin Bradshaw MRN: [id number] Date of Birth: Nov 04, 1935  [facility] CM/SW Contact:  Gerilyn Pilgrim, LCSW Phone Number: 04/01/2022, 8:37 AM   Clinical Narrative:   Pt has auth to discharge to [facility] RN given number for report. Christine with [employer] notified. CSW will send DC summary once in. Medical Necessity printed to unit. POA notified via voicemail of transfer.            Patient Goals and CMS Choice      Discharge Placement                         Discharge Plan and Services Additional resources added to the After Visit Summary for                                       Social Determinants of Health (SDOH) Interventions SDOH Screenings   Tobacco Use: Medium Risk (03/25/2022)     Readmission Risk Interventions     No data to display

## 2022-04-01 NOTE — Care Management Important Message (Signed)
Important Message  Patient Details  Name: Kamron Kennebrew MRN: [id number] Date of Birth: 09-11-1935   Medicare Important Message Given:  Yes     [name] 04/01/2022, 10:22 AM

## 2022-04-01 NOTE — Care Management Important Message (Signed)
Important Message  Patient Details  Name: Kevin Bradshaw MRN: [id number] Date of Birth: 1935/11/01   Medicare Important Message Given:  Yes     [name] 04/01/2022, 12:31 PM

## 2022-04-01 NOTE — Progress Notes (Signed)
EMS in the unit to pick up patient.  Discharged to [facility].

## 2022-04-01 NOTE — Discharge Summary (Signed)
Physician Discharge Summary   Patient: Kevin Bradshaw MRN: [id number]  DOB: 08/19/1935   Admit:     Date of Admission: 03/24/2022 Admitted from: home   Discharge: Date of discharge: 04/01/22 Disposition: Skilled nursing facility Condition at discharge: fair  CODE STATUS: DNR - signed order in chart      Discharge Physician: Emeterio Reeve, DO [facility]     PCP: [name], MD  Recommendations for Outpatient Follow-up:  Follow up with PCP [name], MD in 2 weeks Please obtain labs/tests: CBC, BMP in 1-2 weeks Follow w/ cardiology / EP to discuss Watchman device  Follow w/ neurology in 6-8 weeks  Please follow up on the following pending results: none PCP AND OTHER OUTPATIENT PROVIDERS: SEE BELOW FOR SPECIFIC DISCHARGE INSTRUCTIONS PRINTED FOR PATIENT IN ADDITION TO GENERIC AVS PATIENT INFO    Discharge Instructions     Ambulatory referral to Neurology   Complete by: As directed    An appointment is requested in approximately: 8 weeks   Diet - low sodium heart healthy   Complete by: As directed    Increase activity slowly   Complete by: As directed          Discharge Diagnoses: Principal Problem:   CVA (cerebral vascular accident) [facility]) Active Problems:   Dysarthria   Persistent atrial fibrillation ([place])   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  He was admitted in the ICU s/p TNK, tolerated well. Etiology favored to be embolic given patient has a history of known paroxysmal A-fib but not on anticoagulation due to frequent falls.  He was seen by  cardiology who plans to discuss with family whether he might be a candidate for short-term trial of anticoagulation with close follow-up with EP for consideration of watchman's device.  Patient underwent repeat MRI which did not show any increase in the size of infarct or hemorrhage.  Patient is started on aspirin,  Plavix and DVT prophylaxis. TRH pickup 03/26/2022.  Palliative care consulted, goals of care discussed.  CODE STATUS changed to DNR. Has been stable awaiting SNF rehab placement. 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 Neurology consulted, repeat MRI showed no hemorrhage. Echo showed LVEF 60-65%, No RWMA. No embolic source continue on aspirin , Plavix - follow neurology outpatient  If anticoagulation is trialed in the future there is no neurologic indication to continue antiplatelets. In the absence of cardiac indication to continue aspirin please d/c antiplatelets if patient is started on anticoagulation.   Persistent atrial fibrillation: Heart rate is well-controlled.  echo showed normal LVEF continue  metoprolol follow-up with EP for consideration for watchman's device.  Anticoagulation would be discussed as an outpatient.   Recurrent falls: Continue fall precautions PT evaluation recommended SNF.   BPH: Continue Proscar and Flomax.   Parkinson's disease: Patient not on any anti-Parkinson medications. Parkinson's medications may be  started outpatient.   Goals of care discussion: Palliative care consulted.  Goals of care discussed in detail.   Family and patient has decided DNR.              Discharge Instructions  Allergies as of 04/01/2022   No Known Allergies      Medication List     STOP taking these medications    potassium chloride SA 20 MEQ tablet Commonly known as: KLOR-CON M       TAKE these medications    acetaminophen 325 MG tablet Commonly known as: TYLENOL Take 2 tablets (650 mg total) by mouth every 6 (six) hours as needed for mild pain (or Fever >/= 101).   aspirin 81 MG tablet Take 81 mg by mouth daily.   atorvastatin 80 MG tablet Commonly known as: LIPITOR Take 1 tablet (80 mg total) by mouth daily at 6 PM.   clopidogrel 75 MG tablet Commonly known as: PLAVIX Take 1 tablet (75 mg total) by mouth daily.   ezetimibe 10 MG tablet Commonly known as: ZETIA Take 1 tablet (10 mg total) by mouth daily.   feeding supplement Liqd Take 237 mLs by mouth 3 (three) times daily between meals.   ferrous sulfate 325 (65 FE) MG EC tablet Take 325 mg by mouth daily with breakfast.   finasteride 5 MG tablet Commonly known as: PROSCAR Take 5 mg by mouth daily.   metoprolol succinate 25 MG 24 hr tablet Commonly known as: TOPROL-XL Take 1 tablet (25 mg total) by mouth daily. What changed:  medication strength how much to take additional instructions   multivitamin with minerals Tabs tablet Take 1 tablet by mouth daily.   pantoprazole 40 MG tablet Commonly known as: PROTONIX Take 1 tablet (40 mg total) by mouth at bedtime.   senna-docusate 8.6-50 MG tablet Commonly known as: Senokot-S Take 1 tablet by mouth at bedtime as needed for mild constipation.   tamsulosin 0.4 MG Caps capsule Commonly known as: FLOMAX Take 0.4 mg by mouth daily.         Contact information for follow-up providers     [name], MD. Schedule an appointment as soon as possible for  a visit.   Specialty: Internal Medicine Why: confirm cardiology follow up and arrange neurology follow up Contact information: [place] [address] [phone]         Minna Merritts, MD. Schedule an appointment as soon as possible for a visit.   Specialty: Cardiology Why: arrange EP follow up Contact information: [place]  [phone]              Contact information for after-discharge care     Destination     [facility] Preferred SNF .   Service: Skilled Nursing Contact information: [place] [phone]                     No Known Allergies   Subjective: pt feeling well this morning and no concerns   Discharge Exam: BP (!) 162/87 (BP Location: Left Arm)   Pulse 80   Temp 97.6 F (  36.4 C)   Resp 14   Ht 5\' 2"  (1.575 m)   Wt 69.9 kg   SpO2 99%   BMI 28.19 kg/m  General: Pt is alert, awake, not in acute distress Cardiovascular: RS1/S2 WNL,RRR Respiratory: CTA bilaterally, no wheezing, no rhonchi Abdominal: Soft, NT, ND, bowel sounds + Extremities: no edema, no cyanosis     The results of significant diagnostics from this hospitalization (including imaging, microbiology, ancillary and laboratory) are listed below for reference.     Microbiology: Recent Results (from the past 240 hour(s))  MRSA Next Gen by PCR, Nasal     Status: Abnormal   Collection Time: 03/24/22  8:16 PM   Specimen: Nasal Mucosa; Nasal Swab  Result Value Ref Range Status   MRSA by PCR Next Gen DETECTED (A) NOT DETECTED Final    Comment: RESULT CALLED TO, READ BACK BY AND VERIFIED WITH: [facility] MOORE 03/24/22 2208 MU (NOTE) The GeneXpert MRSA Assay (FDA approved for NASAL specimens only), is one component of a comprehensive MRSA colonization surveillance program. It is not intended to diagnose MRSA infection nor to guide or monitor treatment  for MRSA infections. Test performance is not FDA approved in patients less than 21 years old. Performed at [facility], [address] [place], [place] [address]      Labs: BNP (last 3 results) No results for input(s): "BNP" in the last 8760 hours. Basic Metabolic Panel: Recent Labs  Lab 03/26/22 0448 03/27/22 0440 03/28/22 0347  NA 140 141 136  K 3.8 4.8 3.6  CL 107 105 104  CO2 27 29 26   GLUCOSE 103* 105* 115*  BUN 12 13 21   CREATININE 0.96 0.93 0.91  CALCIUM 8.6* 9.4 8.8*  MG 1.9 1.9  --   PHOS 3.2 3.6 4.4   Liver Function Tests: Recent Labs  Lab 03/26/22 0448 03/27/22 0440 03/28/22 0347  ALBUMIN 3.1* 3.1* 3.0*   No results for input(s): "LIPASE", "AMYLASE" in the last 168 hours. No results for input(s): "AMMONIA" in the last 168 hours. CBC: Recent Labs  Lab 03/26/22 0448 03/27/22 0440 03/28/22 0347  WBC 4.7 5.1 4.4  HGB 12.9* 14.0 13.3  HCT 40.6 43.8 41.2  MCV 92.9 94.0 92.4  PLT 150 158 160   Cardiac Enzymes: No results for input(s): "CKTOTAL", "CKMB", "CKMBINDEX", "TROPONINI" in the last 168 hours. BNP: Invalid input(s): "POCBNP" CBG: Recent Labs  Lab 03/26/22 0037 03/26/22 0503 03/26/22 0808 03/26/22 1143 03/26/22 1632  GLUCAP 129* 100* 98 121* 120*   D-Dimer No results for input(s): "DDIMER" in the last 72 hours. Hgb A1c No results for input(s): "HGBA1C" in the last 72 hours. Lipid Profile No results for input(s): "CHOL", "HDL", "LDLCALC", "TRIG", "CHOLHDL", "LDLDIRECT" in the last 72 hours. Thyroid function studies No results for input(s): "TSH", "T4TOTAL", "T3FREE", "THYROIDAB" in the last 72 hours.  Invalid input(s): "FREET3" Anemia work up No results for input(s): "VITAMINB12", "FOLATE", "FERRITIN", "TIBC", "IRON", "RETICCTPCT" in the last 72 hours. Urinalysis    Component Value Date/Time   COLORURINE YELLOW (A) 10/02/2018 1756   APPEARANCEUR TURBID (A) 10/02/2018 1756   LABSPEC 1.021 10/02/2018 1756   PHURINE  5.0 10/02/2018 1756   GLUCOSEU NEGATIVE 10/02/2018 1756   HGBUR LARGE (A) 10/02/2018 1756   BILIRUBINUR NEGATIVE 10/02/2018 1756   KETONESUR 5 (A) 10/02/2018 1756   PROTEINUR 100 (A) 10/02/2018 1756   NITRITE POSITIVE (A) 10/02/2018 1756   LEUKOCYTESUR MODERATE (A) 10/02/2018 1756   Sepsis Labs Recent Labs  Lab 03/26/22 0448  03/27/22 0440 03/28/22 0347  WBC 4.7 5.1 4.4   Microbiology Recent Results (from the past 240 hour(s))  MRSA Next Gen by PCR, Nasal     Status: Abnormal   Collection Time: 03/24/22  8:16 PM   Specimen: Nasal Mucosa; Nasal Swab  Result Value Ref Range Status   MRSA by PCR Next Gen DETECTED (A) NOT DETECTED Final    Comment: RESULT CALLED TO, READ BACK BY AND VERIFIED WITH: [facility] MOORE 03/24/22 2208 MU (NOTE) The GeneXpert MRSA Assay (FDA approved for NASAL specimens only), is one component of a comprehensive MRSA colonization surveillance program. It is not intended to diagnose MRSA infection nor to guide or monitor treatment for MRSA infections. Test performance is not FDA approved in patients less than 51 years old. Performed at [facility], [address], [place], [place] [address]    Imaging MR BRAIN WO CONTRAST  Result Date: 03/25/2022 CLINICAL DATA:  Stroke, follow up. EXAM: MRI HEAD WITHOUT CONTRAST TECHNIQUE: Multiplanar, multiecho pulse sequences of the brain and surrounding structures were obtained without intravenous contrast. COMPARISON:  Head CT 03/24/2022 and MRI 08/21/2017 FINDINGS: The study is moderately motion degraded. Brain: There is a small acute cortical infarct posteriorly in the right frontal lobe. A right parieto-occipital infarct is chronic. There is also a chronic lacunar infarct in the posterior left lentiform nucleus. There is advanced central predominant cerebral atrophy. T2 hyperintensities in the periventricular white matter bilaterally are nonspecific but compatible with mild chronic small vessel ischemic disease.  No intracranial hemorrhage, mass, midline shift, or extra-axial fluid collection is identified. Vascular: Major intracranial vascular flow voids are preserved. Skull and upper cervical spine: Unremarkable bone marrow signal. Sinuses/Orbits: Unremarkable orbits. Mild left ethmoid sinus mucosal thickening. Clear mastoid air cells. Other: None. IMPRESSION: 1. Small acute right frontal infarct. 2. Chronic right parieto-occipital and left basal ganglia infarcts. 3. Mild chronic small vessel ischemic disease and advanced cerebral atrophy. Electronically Signed   By: Logan Bores M.D.   On: 03/25/2022 18:45   ECHOCARDIOGRAM COMPLETE  Result Date: 03/25/2022    ECHOCARDIOGRAM REPORT   Patient Name:   ISAMU GUNDY Date of Exam: 03/25/2022 Medical Rec #:  [id number]       Height:       62.0 in Accession #:    [id number]      Weight:       154.1 lb Date of Birth:  [birth year]        BSA:          23.711 m Patient Age:    34 years        BP:           119/97 mmHg Patient Gender: M               HR:           88 bpm. Exam Location:  [facility] Procedure: 2D Echo, Color Doppler and Cardiac Doppler Indications:     Stroke I63.9  History:         Patient has prior history of Echocardiogram examinations, most                  recent 10/03/2018. Stroke; Risk Factors:Hypertension and                  Dyslipidemia.  Sonographer:     Sherrie Sport Referring Phys:  [id number] [place] Diagnosing Phys: Ida Rogue MD  Sonographer Comments: Image quality was good. IMPRESSIONS  1.  Left ventricular ejection fraction, by estimation, is 60 to 65%. The left ventricle has normal function. The left ventricle has no regional wall motion abnormalities. There is moderate left ventricular hypertrophy. Left ventricular diastolic parameters are indeterminate.  2. Right ventricular systolic function is normal. The right ventricular size is normal.  3. The mitral valve is normal in structure. Mild mitral valve regurgitation. No evidence of mitral  stenosis.  4. The aortic valve is normal in structure. There is mild calcification of the aortic valve. Aortic valve regurgitation is not visualized. Aortic valve sclerosis is present, with no evidence of aortic valve stenosis.  5. The inferior vena cava is normal in size with greater than 50% respiratory variability, suggesting right atrial pressure of 3 mmHg. FINDINGS  Left Ventricle: Left ventricular ejection fraction, by estimation, is 60 to 65%. The left ventricle has normal function. The left ventricle has no regional wall motion abnormalities. The left ventricular internal cavity size was normal in size. There is  moderate left ventricular hypertrophy. Left ventricular diastolic parameters are indeterminate. Right Ventricle: The right ventricular size is normal. No increase in right ventricular wall thickness. Right ventricular systolic function is normal. Left Atrium: Left atrial size was normal in size. Right Atrium: Right atrial size was normal in size. Pericardium: There is no evidence of pericardial effusion. Mitral Valve: The mitral valve is normal in structure. Mild mitral valve regurgitation. No evidence of mitral valve stenosis. Tricuspid Valve: The tricuspid valve is normal in structure. Tricuspid valve regurgitation is mild . No evidence of tricuspid stenosis. Aortic Valve: The aortic valve is normal in structure. There is mild calcification of the aortic valve. Aortic valve regurgitation is not visualized. Aortic valve sclerosis is present, with no evidence of aortic valve stenosis. Aortic valve mean gradient  measures 4.0 mmHg. Aortic valve peak gradient measures 6.9 mmHg. Aortic valve area, by VTI measures 2.16 cm. Pulmonic Valve: The pulmonic valve was normal in structure. Pulmonic valve regurgitation is not visualized. No evidence of pulmonic stenosis. Aorta: The aortic root is normal in size and structure. Venous: The inferior vena cava is normal in size with greater than 50% respiratory  variability, suggesting right atrial pressure of 3 mmHg. IAS/Shunts: No atrial level shunt detected by color flow Doppler.  LEFT VENTRICLE PLAX 2D LVIDd:         3.20 cm LVIDs:         2.00 cm LV PW:         1.40 cm LV IVS:        1.60 cm LVOT diam:     2.00 cm LV SV:         50 LV SV Index:   29 LVOT Area:     3.14 cm  RIGHT VENTRICLE RV Basal diam:  2.80 cm RV Mid diam:    1.80 cm RV S prime:     14.40 cm/s TAPSE (M-mode): 2.0 cm LEFT ATRIUM             Index        RIGHT ATRIUM           Index LA diam:        3.60 cm 2.10 cm/m   RA Area:     17.00 cm LA Vol (A2C):   39.5 ml 23.08 ml/m  RA Volume:   39.80 ml  23.26 ml/m LA Vol (A4C):   45.5 ml 26.59 ml/m LA Biplane Vol: 44.1 ml 25.77 ml/m  AORTIC VALVE AV Area (Vmax):  1.95 cm AV Area (Vmean):   2.08 cm AV Area (VTI):     2.16 cm AV Vmax:           131.00 cm/s AV Vmean:          92.400 cm/s AV VTI:            0.231 m AV Peak Grad:      6.9 mmHg AV Mean Grad:      4.0 mmHg LVOT Vmax:         81.40 cm/s LVOT Vmean:        61.100 cm/s LVOT VTI:          0.159 m LVOT/AV VTI ratio: 0.69  AORTA Ao Root diam: 3.00 cm MITRAL VALVE                TRICUSPID VALVE MV Area (PHT): 6.54 cm     TR Peak grad:   20.2 mmHg MV Decel Time: 116 msec     TR Vmax:        225.00 cm/s MV E velocity: 130.00 cm/s                             SHUNTS                             Systemic VTI:  0.16 m                             Systemic Diam: 2.00 cm Ida Rogue MD Electronically signed by Ida Rogue MD Signature Date/Time: 03/25/2022/12:18:35 PM    Final       Time coordinating discharge: over 30 minutes  SIGNED:  Emeterio Reeve DO [facility]

## 2022-04-15 ENCOUNTER — Emergency Department (HOSPITAL_COMMUNITY): Payer: Medicare HMO

## 2022-04-15 ENCOUNTER — Emergency Department (HOSPITAL_COMMUNITY)
Admission: EM | Admit: 2022-04-15 | Discharge: 2022-04-15 | Disposition: A | Discharge status: Skilled Nursing Facility | Payer: Medicare HMO | Attending: Emergency Medicine | Admitting: Emergency Medicine

## 2022-04-15 ENCOUNTER — Encounter (HOSPITAL_COMMUNITY): Payer: Self-pay

## 2022-04-15 ENCOUNTER — Other Ambulatory Visit: Payer: Self-pay

## 2022-04-15 DIAGNOSIS — S0990XA Unspecified injury of head, initial encounter: Secondary | ICD-10-CM | POA: Diagnosis present

## 2022-04-15 DIAGNOSIS — U071 COVID-19: Secondary | ICD-10-CM | POA: Diagnosis not present

## 2022-04-15 DIAGNOSIS — W19XXXA Unspecified fall, initial encounter: Secondary | ICD-10-CM | POA: Insufficient documentation

## 2022-04-15 DIAGNOSIS — Z7982 Long term (current) use of aspirin: Secondary | ICD-10-CM | POA: Diagnosis not present

## 2022-04-15 DIAGNOSIS — Y92129 Unspecified place in nursing home as the place of occurrence of the external cause: Secondary | ICD-10-CM | POA: Insufficient documentation

## 2022-04-15 LAB — RESP PANEL BY RT-PCR (RSV, FLU A&B, COVID)  RVPGX2
Influenza A by PCR: NEGATIVE
Influenza B by PCR: NEGATIVE
Resp Syncytial Virus by PCR: NEGATIVE
SARS Coronavirus 2 by RT PCR: POSITIVE — AB

## 2022-04-15 LAB — CBC WITH DIFFERENTIAL/PLATELET
Abs Immature Granulocytes: 0.01 10*3/uL (ref 0.00–0.07)
Basophils Absolute: 0 10*3/uL (ref 0.0–0.1)
Basophils Relative: 1 %
Eosinophils Absolute: 0 10*3/uL (ref 0.0–0.5)
Eosinophils Relative: 1 %
HCT: 44.7 % (ref 39.0–52.0)
Hemoglobin: 13.9 g/dL (ref 13.0–17.0)
Immature Granulocytes: 0 %
Lymphocytes Relative: 17 %
Lymphs Abs: 0.7 10*3/uL (ref 0.7–4.0)
MCH: 30 pg (ref 26.0–34.0)
MCHC: 31.1 g/dL (ref 30.0–36.0)
MCV: 96.5 fL (ref 80.0–100.0)
Monocytes Absolute: 0.7 10*3/uL (ref 0.1–1.0)
Monocytes Relative: 17 %
Neutro Abs: 2.7 10*3/uL (ref 1.7–7.7)
Neutrophils Relative %: 64 %
Platelets: 168 10*3/uL (ref 150–400)
RBC: 4.63 MIL/uL (ref 4.22–5.81)
RDW: 14.6 % (ref 11.5–15.5)
WBC: 4.2 10*3/uL (ref 4.0–10.5)
nRBC: 0 % (ref 0.0–0.2)

## 2022-04-15 NOTE — Discharge Instructions (Signed)
Return for any problem.  ?

## 2022-04-15 NOTE — ED Notes (Signed)
[facility] called, No ETA 

## 2022-04-15 NOTE — ED Triage Notes (Signed)
Pt to the ed from nursing facility with CC of fall.  Pt was found down unknown amount of time wedged in between bed and night stand. Pt was dxed with covid with today. Pt denies and s/s or pain from fall. Pt and ems is unsure of if he takes a blood thinner. Pt is demented at baseline and denies and falls.

## 2022-04-15 NOTE — ED Notes (Signed)
[facility] has arrived for p transport.

## 2022-04-15 NOTE — ED Provider Notes (Signed)
[facility] Provider Note   CSN: [id number] Arrival date & time: 04/15/22  1726     History  Chief Complaint  Patient presents with   Kevin Bradshaw is a 87 y.o. male.  87 year old male with prior medical history as detailed below presents for evaluation.  Patient was found at his nursing facility after apparent fall.  Patient is asymptomatic on evaluation.  He denies any injury.  He denies any complaint.  He cannot recall the details of the fall.  Additional details obtained for the patient's family upon their arrival.  Patient apparently diagnosed with COVID infection earlier today.  Patient has been mildly symptomatic for the last 4 days.  The history is provided by the patient and medical records.       Home Medications Prior to Admission medications   Medication Sig Start Date End Date Taking? Authorizing Provider  acetaminophen (TYLENOL) 325 MG tablet Take 2 tablets (650 mg total) by mouth every 6 (six) hours as needed for mild pain (or Fever >/= 101). 02/21/18   Nicholes Mango, MD  aspirin 81 MG tablet Take 81 mg by mouth daily.      [provider]  atorvastatin (LIPITOR) 80 MG tablet Take 1 tablet (80 mg total) by mouth daily at 6 PM. 08/23/17   Demetrios Loll, MD  clopidogrel (PLAVIX) 75 MG tablet Take 1 tablet (75 mg total) by mouth daily. 08/23/17   Demetrios Loll, MD  ezetimibe (ZETIA) 10 MG tablet Take 1 tablet (10 mg total) by mouth daily. 04/01/22   Emeterio Reeve, DO  feeding supplement (ENSURE ENLIVE / ENSURE PLUS) LIQD Take 237 mLs by mouth 3 (three) times daily between meals. 04/01/22   Emeterio Reeve, DO  ferrous sulfate 325 (65 FE) MG EC tablet Take 325 mg by mouth daily with breakfast. 10/13/20   [provider]  finasteride (PROSCAR) 5 MG tablet Take 5 mg by mouth daily.    [provider]  metoprolol succinate (TOPROL-XL) 25 MG 24 hr tablet Take 1 tablet (25 mg total) by mouth  daily. 04/01/22   Emeterio Reeve, DO  Multiple Vitamin (MULTIVITAMIN WITH MINERALS) TABS tablet Take 1 tablet by mouth daily. 04/01/22   Emeterio Reeve, DO  pantoprazole (PROTONIX) 40 MG tablet Take 1 tablet (40 mg total) by mouth at bedtime. 04/01/22   Emeterio Reeve, DO  senna-docusate (SENOKOT-S) 8.6-50 MG tablet Take 1 tablet by mouth at bedtime as needed for mild constipation. 04/01/22   Emeterio Reeve, DO  tamsulosin (FLOMAX) 0.4 MG CAPS capsule Take 0.4 mg by mouth daily. 12/26/20   [provider]  Calcium Carbonate (CALCIUM 500 PO) Take 2 tablets by mouth daily.    01/06/16  [provider]      Allergies    Patient has no known allergies.    Review of Systems   Review of Systems  All other systems reviewed and are negative.   Physical Exam Updated Vital Signs BP 125/81   Pulse 79   Resp (!) 22   Ht 5\' 2"  (1.575 m)   Wt 72.6 kg   SpO2 100%   BMI 29.26 kg/m  Physical Exam Vitals and nursing note reviewed.  Constitutional:      General: He is not in acute distress.    Appearance: Normal appearance. He is well-developed.  HENT:     Head: Normocephalic and atraumatic.  Eyes:     Conjunctiva/sclera: Conjunctivae normal.  Pupils: Pupils are equal, round, and reactive to light.  Cardiovascular:     Rate and Rhythm: Normal rate and regular rhythm.     Heart sounds: Normal heart sounds.  Pulmonary:     Effort: Pulmonary effort is normal. No respiratory distress.     Breath sounds: Normal breath sounds.  Abdominal:     General: There is no distension.     Palpations: Abdomen is soft.     Tenderness: There is no abdominal tenderness.  Musculoskeletal:        General: No deformity. Normal range of motion.     Cervical back: Normal range of motion and neck supple.  Skin:    General: Skin is warm and dry.  Neurological:     General: No focal deficit present.     Mental Status: He is alert and oriented to person, place, and time.      ED Results / Procedures / Treatments   Labs (all labs ordered are listed, but only abnormal results are displayed) Labs Reviewed  RESP PANEL BY RT-PCR (RSV, FLU A&B, COVID)  [id number] - Abnormal; Notable for the following components:      Result Value   SARS Coronavirus 2 by RT PCR POSITIVE (*)    All other components within normal limits  CBC WITH DIFFERENTIAL/PLATELET  URINALYSIS, ROUTINE W REFLEX MICROSCOPIC  BASIC METABOLIC PANEL    EKG None  Radiology CT Head Wo Contrast  Result Date: 04/15/2022 CLINICAL DATA:  Found down EXAM: CT HEAD WITHOUT CONTRAST TECHNIQUE: Contiguous axial images were obtained from the base of the skull through the vertex without intravenous contrast. RADIATION DOSE REDUCTION: This exam was performed according to the departmental dose-optimization program which includes automated exposure control, adjustment of the mA and/or kV according to patient size and/or use of iterative reconstruction technique. COMPARISON:  03/24/2022 CT head, 03/25/2022 MRI head FINDINGS: Brain: Increased hypodensity in the right frontal lobe, likely correlating with acute infarcts seen on 03/25/2022. Redemonstrated remote right frontoparietal infarct. No evidence of hemorrhage, mass, mass effect, or midline shift. Advanced cerebral atrophy for age with ex vacuo dilatation of the ventricles. Periventricular white matter changes, likely the sequela of chronic small vessel ischemic disease. Gray-white differentiation is preserved. The basilar cisterns are patent. Vascular: No hyperdense vessel. Atherosclerotic calcifications in the intracranial carotid and vertebral arteries. Skull: Negative for fracture or focal lesion. Sinuses/Orbits: Mucosal thickening in the ethmoid air cells and right maxillary sinus. No acute finding in the orbits. Other: The mastoid air cells are well aerated. IMPRESSION: 1. Increased hypodensity in the right frontal lobe, likely evolution of the acute infarcts seen  on 03/25/2022. No evidence of hemorrhage. 2. No additional acute intracranial process. Electronically Signed   By: Merilyn Baba M.D.   On: 04/15/2022 19:41   DG Pelvis 1-2 Views  Result Date: 04/15/2022 CLINICAL DATA:  Pain after fall EXAM: PELVIS - 2 VIEW COMPARISON:  None Available. FINDINGS: No fracture or dislocation. Preserved bone mineralization. Degenerative changes seen of the lower lumbar spine with laminectomy changes. Preserved joint spaces of the hips, symphysis. Scattered vascular calcifications. IMPRESSION: No acute osseous abnormality Electronically Signed   By: Jill Side M.D.   On: 04/15/2022 18:40   DG Chest Port 1 View  Result Date: 04/15/2022 CLINICAL DATA:  Cough EXAM: PORTABLE CHEST 1 VIEW COMPARISON:  X-ray 10/07/2020 FINDINGS: Calcified aorta. No consolidation, pneumothorax or effusion. No edema. Eventration of the right hemidiaphragm. Overlapping cardiac leads. Slight curvature of the spine. IMPRESSION: No acute cardiopulmonary  disease Electronically Signed   By: Jill Side M.D.   On: 04/15/2022 18:39    Procedures Procedures    Medications Ordered in ED Medications - No data to display  ED Course/ Medical Decision Making/ A&P                             Medical Decision Making Amount and/or Complexity of Data Reviewed Labs: ordered. Radiology: ordered.    Medical Screen Complete  This patient presented to the ED with complaint of fall.  This complaint involves an extensive number of treatment options. The initial differential diagnosis includes, but is not limited to, trauma from fall, metabolic abnormality, etc.  This presentation is: Acute, Chronic, Self-Limited, Previously Undiagnosed, Uncertain Prognosis, Complicated, Systemic Symptoms, and Threat to Life/Bodily Function  Patient sent from facility for evaluation.  Patient without symptoms or complaint.  Patient with reported possible fall.  Exam is not suggestive of significant traumatic  injury.  Imaging obtained is without evidence of traumatic injury.    Screening labs ordered.  Patient is COVID-positive.  Patient has been diagnosed with COVID earlier.  He is asymptomatic from his COVID infection.  He appears to be on approximately day 4 or 5 of his infection course.  Screening labs obtained are without significant abnormality.  Despite multiple draws lab was unable to process patient's BMP.  After multiple attempts at [facility] collection the patient's family desires discharge.  They are aware that thorough evaluation would include BMP results.  However, given length of patient's stay and his asymptomatic state they feel comfortable having him be discharged.  Importance of close follow-up is stressed.  Strict return precautions given and understood.  Additional history obtained:  Additional history obtained from [facility] External records from outside sources obtained and reviewed including prior ED visits and prior Inpatient records.    Lab Tests:  I ordered and personally interpreted labs.  The pertinent results include: CBC, COVID, flu, BMP   Imaging Studies ordered:  I ordered imaging studies including CT head, plain films of chest and pelvis I independently visualized and interpreted obtained imaging which showed NAD I agree with the radiologist interpretation. Problem List / ED Course:  Fall, COVID infection   Reevaluation:  After the interventions noted above, I reevaluated the patient and found that they have: improved    Disposition:  After consideration of the diagnostic results and the patients response to treatment, I feel that the patent would benefit from close outpatient follow-up.          Final Clinical Impression(s) / ED Diagnoses Final diagnoses:  Fall, initial encounter    Rx / DC Orders ED Discharge Orders     None         Valarie Merino, MD 04/15/22 2319

## 2022-04-15 NOTE — ED Notes (Signed)
Attempted report to facility, they did not answer.

## 2022-04-15 NOTE — ED Notes (Signed)
[facility] called  

## 2022-05-07 NOTE — Progress Notes (Deleted)
Initial neurology clinic note  Kevin Bradshaw MRN: [id number] DOB: 1935-09-17  Referring provider: Sunnie Nielsen, DO  Primary care provider: Gracelyn Nurse, MD  Reason for consult:  stroke  Subjective:  This is [name], a 87 y.o. ***-handed male with a medical history of afib (not on AC due to frequent falls), HTN, HLD, vascular PD, and stroke in 2019 and 2024*** who presents to neurology clinic for post stroke hospital follow up. The patient is accompanied by ***.  ***  Patient presented to [facility] from home via EMS on 03/24/22 for acute onset severe dysarthria, left hemineglect, and left sensory deficit. Per neurology note from admission by Dr. Selina Bradshaw on 03/24/22: This is a 87 yo man with hx a fib not on anticoagulation 2/2 frequent falls, HL, HTN, Parkinson disease, stroke 2019 who presents with acute onset severe dysarthria, L hemineglect, and L sensory deficit. NIHSS = 7. CT head showed acute R parietal infarct ASPECTS 8. Patient was mildly confused and had difficulty following simple commands and therefore was unable to provide clear history or consent.   LKW initially thought to be this AM at 0700 when Bradshaw last saw him. She subsequently reported that her husband had seen him more recently but was unsure what time. I called his cell phone and got voicemail. At this point 2/2 unclear last known well he was not eligible for TNK. Son in law then arrived and was able to confirm that last known well was 1400 today. Based on that information he was a TNK candidate. Difficulty obtaining LKW resulted in delay in door to needle time. Risks/benefits and alternatives were discussed with his Bradshaw who gave informed consent to proceed. TNK was administered at 1759. CT head was interpreted by me prior to TNK being given.   CTA resulted after TNK was administered and demonstrated a suboccipital hematoma in the dorsal soft tissue, personal review. No exam change at this time; D/w  Dr. Pearlean Bradshaw, no need to reverse TNK. Patient subsequently had R sided facial flushing and received benadryl after which he became sleepy. Repeat head CT was performed for mental status change but showed only residual contrast from CTA H&N and no hemorrhage in the brain (d/w radiology by phone).   CTA H&N showed no LVO but did show severe L P2 stenosis  03/26/22 follow up note: S: Patient states that he continunes to feel better, dysarthria and hemineglect are still present but both improved. No new complaints today. MRI brain 24 hrs post TNK showed small acute R frontal infarct with no e/o hemorrhagic conversion.   A: Etiology favored to be embolic given patient presented in a fib. He has a known history pAF but was not on anticoagulation PTA 2/2 frequent falls. He was seen by cardiology who plans to discuss with family whether he might be a candidate for short trial of anticoagulation with close f/u with EP for consideration of Watchman device. Anticoagulation trial would be started in clinic outside the acute post-stroke period if it is determined to be appropriate.    MRI brain showed no hemorrhagic conversion of his ischemic infarct therefore will start DAPT and DVT prophylaxis today.   - Resume home aspirin 81mg  daily and plavix 75mg  daily. If anticoagulation is trialed in the future there is no neurologic indication to continue antiplatelets. In the absence of cardiac indication to continue aspirin please d/c antiplatelets if patient is started on anticoagulation. - DVT prophylaxis ordered - SBP goal <180/105 for one  more day, f/b gently lowering goal to normotension. Avoid hypotension. - STAT head CT for any change in neurologic exam - Cardiology to determine whether he might be a candidate for short trial of anticoagulation with close f/u with EP for consideration of Watchman device - PT/OT and speech therapy - stroke education - outpatient f/u with neurology after discharge (I will  arrange)  Cardiology also saw patient during admission. Per 03/25/22 note by Dr. Mariah Milling: Persistent atrial fibrillation Prior history of atrial fibrillation 2020 Noted to be in normal sinus rhythm September 2022, no EKG since that time until admission March 13 showing atrial fibrillation Not on anticoagulation secondary to high fall risk Currently not a good candidate for NOAC in the setting of recent stroke -Recommend increasing metoprolol succinate back to 50 daily for rate control On discussion with Kevin Bradshaw at the bedside concerning various treatment options for his atrial fibrillation, discussed challenges with anticoagulation given frequent falls, debility.  We have recommended follow-up with EP for consideration of Watchman (if he is a candidate) given multiple strokes -They do mention that he would likely be placed in nursing facility though one has not been arranged.  If he is essentially wheelchair-bound, only moving with assistance from staff at the nursing facility and lower risk of falls given supervision, potentially could initiate a Noac   Acute stroke, status post TNK In the setting of atrial fibrillation Right parietal infarct felt to be embolic Post TNK suboccipital hematoma in the dorsal soft tissue Mentating well, feels he is close to his baseline   Parkinson's/history of falls Managed by family at home Prior history of stroke 2019  Patient saw Dr. Lalla Bradshaw in EP for consideration of Watchman on 05/12/22. Dr. Lalla Bradshaw and patient agreed to proceed with Watchman.***    MEDICATIONS:  Outpatient Encounter Medications as of 05/14/2022  Medication Sig   acetaminophen (TYLENOL) 325 MG tablet Take 2 tablets (650 mg total) by mouth every 6 (six) hours as needed for mild pain (or Fever >/= 101).   aspirin 81 MG tablet Take 81 mg by mouth daily.     atorvastatin (LIPITOR) 80 MG tablet Take 1 tablet (80 mg total) by mouth daily at 6 PM.   clopidogrel (PLAVIX) 75 MG  tablet Take 1 tablet (75 mg total) by mouth daily.   ezetimibe (ZETIA) 10 MG tablet Take 1 tablet (10 mg total) by mouth daily.   feeding supplement (ENSURE ENLIVE / ENSURE PLUS) LIQD Take 237 mLs by mouth 3 (three) times daily between meals.   ferrous sulfate 325 (65 FE) MG EC tablet Take 325 mg by mouth daily with breakfast.   finasteride (PROSCAR) 5 MG tablet Take 5 mg by mouth daily.   metoprolol succinate (TOPROL-XL) 25 MG 24 hr tablet Take 1 tablet (25 mg total) by mouth daily.   Multiple Vitamin (MULTIVITAMIN WITH MINERALS) TABS tablet Take 1 tablet by mouth daily.   pantoprazole (PROTONIX) 40 MG tablet Take 1 tablet (40 mg total) by mouth at bedtime.   senna-docusate (SENOKOT-S) 8.6-50 MG tablet Take 1 tablet by mouth at bedtime as needed for mild constipation.   tamsulosin (FLOMAX) 0.4 MG CAPS capsule Take 0.4 mg by mouth daily.   [DISCONTINUED] Calcium Carbonate (CALCIUM 500 PO) Take 2 tablets by mouth daily.     No facility-administered encounter medications on file as of 05/14/2022.    PAST MEDICAL HISTORY: Past Medical History:  Diagnosis Date   Diastolic dysfunction    a. 09/2018 Echo: EF 55-60%,  mild LVH. Diast dysfxn. Nl RV fxn. Nl LA size.   Falls    Hyperlipidemia    Hypertension    PAF (paroxysmal atrial fibrillation) (HCC)    a. 09/2018 in the setting of urosepsis; b. CHA2DS2VASc = 5-->No OAC due to high risk for falls (on ASA/Plavix).   Parkinson disease    Stroke (HCC) 08/2017   Urosepsis (HCC) 09/2018    PAST SURGICAL HISTORY: Past Surgical History:  Procedure Laterality Date   CARDIOVASCULAR STRESS TEST     normal   CARPAL TUNNEL RELEASE  2009   right hand   X-STOP IMPLANTATION     X-STOP IMPLANTATION     Dr. Gerrit Heck    ALLERGIES: No Known Allergies  FAMILY HISTORY: Family History  Problem Relation Age of Onset   Heart disease Father 66   Cancer Sister        ? type   Heart disease Brother    Cancer Brother        ? type   Stroke Child     Seizures Child     SOCIAL HISTORY: Social History   Tobacco Use   Smoking status: Former    Packs/day: 1.50    Years: 50.00    Additional pack years: 0.00    Total pack years: 75.00    Types: Cigarettes    Quit date: 11/04/2002    Years since quitting: 19.5   Smokeless tobacco: Never  Vaping Use   Vaping Use: Never used  Substance Use Topics   Alcohol use: Yes    Comment: once a week   Drug use: No   Social History   Social History Narrative   Not on file    Objective:  Vital Signs:  There were no vitals taken for this visit.  ***  Labs and Imaging review: Internal labs: Lab Results  Component Value Date   HGBA1C 6.1 (H) 03/24/2022   No results found for: "VITAMINB12" Lab Results  Component Value Date   TSH 2.417 03/25/2022   No results found for: "ESRSEDRATE", "POCTSEDRATE"  CBC (04/15/22) wnl Renal function panel (03/28/22): unremarkable INR (03/24/22): 1.0  External labs: B12 (12/01/20): 945  Imaging: CT head wo contrast (03/24/22): FINDINGS: Brain: Acute to subacute right parietal lobe infarct. No hemorrhage. Advanced generalized volume loss. Sequela of moderate chronic microvascular ischemic change. Chronic infarct in the left lentiform nucleus. Ventriculomegaly that is proportional to the degree of volume loss. No extra-axial fluid collection.   Vascular: No hyperdense vessel or unexpected calcification.   Skull: Normal. Negative for fracture or focal lesion.   Sinuses/Orbits: No middle ear or mastoid effusion. Paranasal sinuses are clear. Orbits are unremarkable.   Other: None.   ASPECTS [facility] Stroke Program Early CT Score)   - Ganglionic level infarction (caudate, lentiform nuclei, internal capsule, insula, M1-M3 cortex): 6   - Supraganglionic infarction (M4-M6 cortex): 2   Total score (0-10 with 10 being normal): 8   IMPRESSION: 1. Acute right MCA territory predominantly involving the parietal lobe infarct. No hemorrhage. 2.  Aspects is 8.  CTA head and neck (03/24/22): FINDINGS: CTA NECK FINDINGS   Aortic arch: Three-vessel arch configuration. Atherosclerotic calcifications of the aortic arch and arch vessel origins. Arch vessel origins are patent.   Right carotid system: Mixed plaque of the right carotid bulb with less than 50% stenosis of the proximal right cervical ICA.   Left carotid system: Predominantly calcified plaque of the left carotid bulb without stenosis.   Vertebral arteries: Patent from  the origin to the confluence with the basilar without stenosis or dissection.   Skeleton: Severe degenerative changes of the cervical spine with multilevel mild-to-moderate stenosis.   Other neck: Hematoma in the suboccipital dorsal soft tissues.   Upper chest: Unremarkable.   Review of the MIP images confirms the above findings   CTA HEAD FINDINGS   Anterior circulation: Calcified plaque along the carotid siphons without hemodynamically significant stenosis. The proximal ACAs and MCAs are patent without stenosis or aneurysm. Distal branches are symmetric.   Posterior circulation: Normal basilar artery. The SCAs, AICAs and PICAs are patent proximally. Severe stenosis of the left P1 segment with left posterior communicating artery contributing to the left P2 opacification. Distal branches are symmetric.   Venous sinuses: As permitted by contrast timing, patent.   Anatomic variants: None.   Review of the MIP images confirms the above findings   IMPRESSION: 1. No large vessel occlusion. 2. 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 3. Less than 50% stenosis of the proximal right cervical ICA secondary to mixed plaque. 4. Hematoma in the suboccipital dorsal soft tissues. 5. Aortic Atherosclerosis (ICD10-I70.0).  ADDENDUM: CT brain perfusion was subsequently performed.   Automated post processing  demonstrates small, matched perfusion defects in the right frontal operculum and inferior parietal lobe, consistent with small areas of infarction without surrounding ischemic penumbra.  CT head wo contrast (03/24/22): FINDINGS: Brain: Unchanged appearance of the region infarction in the right parietal lobe. Slightly hyperdense appearance of the sulci in this region is favored to represent contrast staining given recent administration iodinated contrast material. No evidence of hemorrhage. Unchanged size and shape of the ventricular system.   Vascular: No hyperdense vessel or unexpected calcification.   Skull: Normal. Negative for fracture or focal lesion. Unchanged subcutaneous soft tissues suboccipital hematoma.   Sinuses/Orbits: No mastoid or middle ear effusion. Paranasal sinuses are clear.   Other: None.   IMPRESSION: Unchanged appearance of the region of infarction in the right parietal lobe. Slightly hyperdense appearance of the sulci in this region of infarction is favored to represent contrast staining given recent administration iodinated contrast material. No evidence of hemorrhage.  MRI brain wo contrast (03/24/22): FINDINGS: The study is moderately motion degraded.   Brain: There is a small acute cortical infarct posteriorly in the right frontal lobe. A right parieto-occipital infarct is chronic. There is also a chronic lacunar infarct in the posterior left lentiform nucleus. There is advanced central predominant cerebral atrophy. T2 hyperintensities in the periventricular white matter bilaterally are nonspecific but compatible with mild chronic small vessel ischemic disease. No intracranial hemorrhage, mass, midline shift, or extra-axial fluid collection is identified.   Vascular: Major intracranial vascular flow voids are preserved.   Skull and upper cervical spine: Unremarkable bone marrow signal.   Sinuses/Orbits: Unremarkable orbits. Mild left ethmoid  sinus mucosal thickening. Clear mastoid air cells.   Other: None.   IMPRESSION: 1. Small acute right frontal infarct. 2. Chronic right parieto-occipital and left basal ganglia infarcts. 3. Mild chronic small vessel ischemic disease and advanced cerebral atrophy.  CT head wo contrast (04/15/22): FINDINGS: Brain: Increased hypodensity in the right frontal lobe, likely correlating with acute infarcts seen on 03/25/2022. Redemonstrated remote right frontoparietal infarct. No evidence of hemorrhage, mass, mass effect, or midline shift. Advanced cerebral atrophy for age with ex vacuo dilatation of the ventricles. Periventricular white matter changes, likely the sequela of chronic small vessel ischemic disease. Gray-white differentiation  is preserved. The basilar cisterns are patent.   Vascular: No hyperdense vessel. Atherosclerotic calcifications in the intracranial carotid and vertebral arteries.   Skull: Negative for fracture or focal lesion.   Sinuses/Orbits: Mucosal thickening in the ethmoid air cells and right maxillary sinus. No acute finding in the orbits.   Other: The mastoid air cells are well aerated.   IMPRESSION: 1. Increased hypodensity in the right frontal lobe, likely evolution of the acute infarcts seen on 03/25/2022. No evidence of hemorrhage. 2. No additional acute intracranial process.  Echo (03/25/22): IMPRESSIONS   1. Left ventricular ejection fraction, by estimation, is 60 to 65%. The  left ventricle has normal function. The left ventricle has no regional  wall motion abnormalities. There is moderate left ventricular hypertrophy.  Left ventricular diastolic  parameters are indeterminate.   2. Right ventricular systolic function is normal. The right ventricular  size is normal.   3. The mitral valve is normal in structure. Mild mitral valve  regurgitation. No evidence of mitral stenosis.   4. The aortic valve is normal in structure. There is mild  calcification  of the aortic valve. Aortic valve regurgitation is not visualized. Aortic  valve sclerosis is present, with no evidence of aortic valve stenosis.   5. The inferior vena cava is normal in size with greater than 50%  respiratory variability, suggesting right atrial pressure of 3 mmHg.    Assessment/Plan:  Kevin Bradshaw is a 87 y.o. male who presents for evaluation of ***. *** has a relevant medical history of ***. *** neurological examination is pertinent for ***. Available diagnostic data is significant for ***. This constellation of symptoms and objective data would most likely localize to ***. ***  PLAN: -Blood work: *** ***  -Return to clinic ***  The impression above as well as the plan as outlined below were extensively discussed with the patient (in the company of ***) who voiced understanding. All questions were answered to their satisfaction.  The patient was counseled on pertinent fall precautions per the printed material provided today, and as noted under the "Patient Instructions" section below.***  When available, results of the above investigations and possible further recommendations will be communicated to the patient via telephone/MyChart. Patient to call office if not contacted after expected testing turnaround time.   Total time spent reviewing records, interview, history/exam, documentation, and coordination of care on day of encounter:  *** min   Thank you for allowing me to participate in patient's care.  If I can answer any additional questions, I would be pleased to do so.  Kevin Balint, MD   CC: Kevin Nurse, MD [address] [place] [address]  CC: Referring provider: Sunnie Nielsen, DO [address] [place],  [place] [address]

## 2022-05-12 ENCOUNTER — Ambulatory Visit: Payer: Medicare HMO | Attending: Cardiology | Admitting: Cardiology

## 2022-05-12 NOTE — Progress Notes (Unsigned)
Electrophysiology Office Note:    Date:  05/12/2022   ID:  Kevin Bradshaw, DOB 16-Feb-1935, MRN [id number]  [facility] Cardiologist:  Yvonne Kendall, MD  [facility] Electrophysiologist:  Lanier Prude, MD   Referring MD: Gracelyn Nurse, MD   Chief Complaint: Atrial fibrillation  History of Present Illness:    Kevin Bradshaw is a 87 y.o. male who I am seeing today for an evaluation of atrial fibrillation at the request of Dr. Mariah Milling.  The patient was hospitalized in March with a stroke.  During the hospitalization he was found to be in atrial fibrillation.  His diagnosis of atrial fibrillation dates back to September 2020.  At that time anticoagulation was not started because of a history of falls.  The patient received thrombolytics but that was complicated by a suboccipital hematoma.  His stroke was felt to be embolic.  He is referred to discuss possible watchman implant as a mechanism to reduce his stroke risk and avoid long-term exposure anticoagulation.        Their past medical, social and family history was reveiwed.   ROS:   Please see the history of present illness.    All other systems reviewed and are negative.  EKGs/Labs/Other Studies Reviewed:    The following studies were reviewed today:  March 25, 2022 echo EF 60% RV normal Mild MR  April 16, 2022 EKG shows atrial fibrillation/flutter with a ventricular rate of about 104 bpm      Physical Exam:    VS:  There were no vitals taken for this visit.    Wt Readings from Last 3 Encounters:  04/15/22 160 lb (72.6 kg)  03/24/22 154 lb 1.6 oz (69.9 kg)  10/19/18 152 lb 8 oz (69.2 kg)     GEN: *** Well nourished, well developed in no acute distress CARDIAC: ***Irregularly irregular, no murmurs, rubs, gallops RESPIRATORY:  Clear to auscultation without rales, wheezing or rhonchi       ASSESSMENT AND PLAN:    1. Persistent atrial fibrillation (HCC)      #Persistent atrial  fibrillation Currently on aspirin and Plavix.  Has a history of falls and a suboccipital hematoma after receiving thrombolytics during his recent hospitalization for cardioembolic stroke.  He is referred to discuss possible watchman implant.   DAPT implant? ---------------  I have seen Kevin Bradshaw in the office today who is being considered for a Watchman left atrial appendage closure device. I believe they will benefit from this procedure given their history of atrial fibrillation, CHA2DS2-VASc score of 6 and unadjusted ischemic stroke rate of 9.7% per year. Unfortunately, the patient is not felt to be a long term anticoagulation candidate secondary to falls and a suboccipital hematoma. The patient's chart has been reviewed and I feel that they would be a candidate for short term oral anticoagulation after Watchman implant.   It is my belief that after undergoing a LAA closure procedure, Kevin Bradshaw will not need long term anticoagulation which eliminates anticoagulation side effects and major bleeding risk.   Procedural risks for the Watchman implant have been reviewed with the patient including a 0.5% risk of stroke, <1% risk of perforation and <1% risk of device embolization. Other risks include bleeding, vascular damage, tamponade, worsening renal function, and death. The patient understands these risk and wishes to proceed.     The published clinical data on the safety and effectiveness of WATCHMAN include but are not limited to the following: Aline August DR,  Everlene Farrier, Sick P et al. for the PROTECT AF Investigators. Percutaneous closure of the left atrial appendage versus warfarin therapy for prevention of stroke in patients with atrial fibrillation: a randomised non-inferiority trial. Lancet 2009; 374: 534-42. Everlene Farrier, Doshi SK, Isa Rankin D et al. on behalf of the PROTECT AF Investigators. Percutaneous Left Atrial Appendage Closure for Stroke Prophylaxis in Patients With Atrial  Fibrillation 2.3-Year Follow-up of the PROTECT AF (Watchman Left Atrial Appendage System for Embolic Protection in Patients With Atrial Fibrillation) Trial. Circulation 2013; 127:720-729. - Alli O, Doshi S,  Kar S, Reddy VY, Sievert H et al. Quality of Life Assessment in the Randomized PROTECT AF (Percutaneous Closure of the Left Atrial Appendage Versus Warfarin Therapy for Prevention of Stroke in Patients With Atrial Fibrillation) Trial of Patients at Risk for Stroke With Nonvalvular Atrial Fibrillation. J Am Coll Cardiol 2013; 61:1790-8. Aline August DR, Mia [place], Price M, Whisenant B, Sievert H, Doshi S, Huber K, Reddy V. Prospective randomized evaluation of the Watchman left atrial appendage Device in patients with atrial fibrillation versus long-term warfarin therapy; the PREVAIL trial. Journal of the [employer] of Cardiology, Vol. 4, No. 1, 2014, 1-11. - Kar S, Doshi SK, Sadhu A, Horton R, Osorio J et al. Primary outcome evaluation of a next-generation left atrial appendage closure device: results from the PINNACLE FLX trial. Circulation 2021;143(18)1754-1762.    After today's visit with the patient which was dedicated solely for shared decision making visit regarding LAA closure device, the patient decided to proceed with the LAA appendage closure procedure scheduled to be done in the near future at [facility]. Prior to the procedure, I would like to obtain a gated CT scan of the chest with contrast timed for PV/LA visualization.    HAS-BLED score 4 Hypertension Yes  Abnormal renal and liver function (Dialysis, transplant, Cr >2.26 mg/dL /Cirrhosis or Bilirubin >2x Normal or AST/ALT/AP >3x Normal) No  Stroke Yes  Bleeding Yes  Labile INR (Unstable/high INR) No  Elderly (>65) Yes  Drugs or alcohol (? 8 drinks/week, anti-plt or NSAID) No   CHA2DS2-VASc Score = 6  The patient's score is based upon: CHF History: 1 HTN History: 1 Diabetes History: 0 Stroke History: 2 Vascular  Disease History: 0 Age Score: 2 Gender Score: 0         Signed, Sheria Lang T. Lalla Brothers, MD, [facility], [facility] 05/12/2022 8:10 AM    Electrophysiology [facility]

## 2022-05-13 ENCOUNTER — Encounter: Payer: Self-pay | Admitting: Cardiology

## 2022-05-14 ENCOUNTER — Encounter: Payer: Self-pay | Admitting: Neurology

## 2022-05-14 ENCOUNTER — Ambulatory Visit: Payer: Medicare HMO | Admitting: Neurology

## 2022-05-14 DIAGNOSIS — Z029 Encounter for administrative examinations, unspecified: Secondary | ICD-10-CM

## 2022-10-03 ENCOUNTER — Inpatient Hospital Stay
Admission: EM | Admit: 2022-10-03 | Discharge: 2022-10-11 | DRG: 689 | Disposition: A | Discharge status: Skilled Nursing Facility | Payer: Medicare HMO | Attending: Internal Medicine | Admitting: Internal Medicine

## 2022-10-03 ENCOUNTER — Inpatient Hospital Stay: Payer: Medicare HMO

## 2022-10-03 ENCOUNTER — Other Ambulatory Visit: Payer: Self-pay

## 2022-10-03 ENCOUNTER — Emergency Department: Payer: Medicare HMO

## 2022-10-03 DIAGNOSIS — N390 Urinary tract infection, site not specified: Secondary | ICD-10-CM | POA: Diagnosis present

## 2022-10-03 DIAGNOSIS — F0280 Dementia in other diseases classified elsewhere without behavioral disturbance: Secondary | ICD-10-CM | POA: Diagnosis present

## 2022-10-03 DIAGNOSIS — E785 Hyperlipidemia, unspecified: Secondary | ICD-10-CM | POA: Diagnosis present

## 2022-10-03 DIAGNOSIS — J449 Chronic obstructive pulmonary disease, unspecified: Secondary | ICD-10-CM | POA: Diagnosis present

## 2022-10-03 DIAGNOSIS — N400 Enlarged prostate without lower urinary tract symptoms: Secondary | ICD-10-CM | POA: Diagnosis present

## 2022-10-03 DIAGNOSIS — Z8249 Family history of ischemic heart disease and other diseases of the circulatory system: Secondary | ICD-10-CM

## 2022-10-03 DIAGNOSIS — R809 Proteinuria, unspecified: Secondary | ICD-10-CM | POA: Diagnosis present

## 2022-10-03 DIAGNOSIS — R414 Neurologic neglect syndrome: Secondary | ICD-10-CM | POA: Diagnosis present

## 2022-10-03 DIAGNOSIS — N3000 Acute cystitis without hematuria: Secondary | ICD-10-CM

## 2022-10-03 DIAGNOSIS — I2510 Atherosclerotic heart disease of native coronary artery without angina pectoris: Secondary | ICD-10-CM | POA: Diagnosis present

## 2022-10-03 DIAGNOSIS — I4821 Permanent atrial fibrillation: Secondary | ICD-10-CM | POA: Diagnosis present

## 2022-10-03 DIAGNOSIS — Z8673 Personal history of transient ischemic attack (TIA), and cerebral infarction without residual deficits: Secondary | ICD-10-CM | POA: Diagnosis not present

## 2022-10-03 DIAGNOSIS — B964 Proteus (mirabilis) (morganii) as the cause of diseases classified elsewhere: Secondary | ICD-10-CM | POA: Diagnosis present

## 2022-10-03 DIAGNOSIS — Z79899 Other long term (current) drug therapy: Secondary | ICD-10-CM

## 2022-10-03 DIAGNOSIS — Z7902 Long term (current) use of antithrombotics/antiplatelets: Secondary | ICD-10-CM | POA: Diagnosis not present

## 2022-10-03 DIAGNOSIS — W06XXXA Fall from bed, initial encounter: Secondary | ICD-10-CM | POA: Diagnosis present

## 2022-10-03 DIAGNOSIS — G9341 Metabolic encephalopathy: Secondary | ICD-10-CM | POA: Diagnosis present

## 2022-10-03 DIAGNOSIS — Z66 Do not resuscitate: Secondary | ICD-10-CM | POA: Diagnosis present

## 2022-10-03 DIAGNOSIS — R29810 Facial weakness: Secondary | ICD-10-CM | POA: Diagnosis present

## 2022-10-03 DIAGNOSIS — I10 Essential (primary) hypertension: Secondary | ICD-10-CM | POA: Diagnosis present

## 2022-10-03 DIAGNOSIS — I639 Cerebral infarction, unspecified: Principal | ICD-10-CM

## 2022-10-03 DIAGNOSIS — I4891 Unspecified atrial fibrillation: Secondary | ICD-10-CM | POA: Diagnosis present

## 2022-10-03 DIAGNOSIS — Z823 Family history of stroke: Secondary | ICD-10-CM

## 2022-10-03 DIAGNOSIS — R296 Repeated falls: Secondary | ICD-10-CM | POA: Diagnosis present

## 2022-10-03 DIAGNOSIS — R4781 Slurred speech: Secondary | ICD-10-CM | POA: Diagnosis present

## 2022-10-03 DIAGNOSIS — G20A1 Parkinson's disease without dyskinesia, without mention of fluctuations: Secondary | ICD-10-CM | POA: Diagnosis present

## 2022-10-03 DIAGNOSIS — Z7982 Long term (current) use of aspirin: Secondary | ICD-10-CM | POA: Diagnosis not present

## 2022-10-03 DIAGNOSIS — Z9181 History of falling: Secondary | ICD-10-CM

## 2022-10-03 DIAGNOSIS — S40811A Abrasion of right upper arm, initial encounter: Secondary | ICD-10-CM | POA: Diagnosis present

## 2022-10-03 DIAGNOSIS — Z87891 Personal history of nicotine dependence: Secondary | ICD-10-CM

## 2022-10-03 DIAGNOSIS — N3001 Acute cystitis with hematuria: Secondary | ICD-10-CM | POA: Diagnosis not present

## 2022-10-03 LAB — URINALYSIS, ROUTINE W REFLEX MICROSCOPIC
Bilirubin Urine: NEGATIVE
Glucose, UA: NEGATIVE mg/dL
Hgb urine dipstick: NEGATIVE
Ketones, ur: NEGATIVE mg/dL
Nitrite: POSITIVE — AB
Protein, ur: >=300 mg/dL — AB
RBC / HPF: >50 RBC/hpf (ref 0–5)
Specific Gravity, Urine: 1.016 (ref 1.005–1.030)
Squamous Epithelial / HPF: 0 /HPF (ref 0–5)
WBC, UA: >50 WBC/hpf (ref 0–5)
pH: 8 (ref 5.0–8.0)

## 2022-10-03 LAB — CBC WITH DIFFERENTIAL/PLATELET
Abs Immature Granulocytes: 0.02 10*3/uL (ref 0.00–0.07)
Basophils Absolute: 0 10*3/uL (ref 0.0–0.1)
Basophils Relative: 1 %
Eosinophils Absolute: 0.2 10*3/uL (ref 0.0–0.5)
Eosinophils Relative: 4 %
HCT: 45.6 % (ref 39.0–52.0)
Hemoglobin: 14.7 g/dL (ref 13.0–17.0)
Immature Granulocytes: 0 %
Lymphocytes Relative: 20 %
Lymphs Abs: 1 10*3/uL (ref 0.7–4.0)
MCH: 30.8 pg (ref 26.0–34.0)
MCHC: 32.2 g/dL (ref 30.0–36.0)
MCV: 95.4 fL (ref 80.0–100.0)
Monocytes Absolute: 0.5 10*3/uL (ref 0.1–1.0)
Monocytes Relative: 10 %
Neutro Abs: 3.3 10*3/uL (ref 1.7–7.7)
Neutrophils Relative %: 65 %
Platelets: 207 10*3/uL (ref 150–400)
RBC: 4.78 MIL/uL (ref 4.22–5.81)
RDW: 13.5 % (ref 11.5–15.5)
WBC: 5.1 10*3/uL (ref 4.0–10.5)
nRBC: 0 % (ref 0.0–0.2)

## 2022-10-03 LAB — COMPREHENSIVE METABOLIC PANEL
ALT: 20 U/L (ref 0–44)
AST: 30 U/L (ref 15–41)
Albumin: 3.7 g/dL (ref 3.5–5.0)
Alkaline Phosphatase: 77 U/L (ref 38–126)
Anion gap: 7 (ref 5–15)
BUN: 18 mg/dL (ref 8–23)
CO2: 27 mmol/L (ref 22–32)
Calcium: 9.3 mg/dL (ref 8.9–10.3)
Chloride: 106 mmol/L (ref 98–111)
Creatinine, Ser: 1.01 mg/dL (ref 0.61–1.24)
GFR, Estimated: >60 mL/min (ref >60)
Glucose, Bld: 98 mg/dL (ref 70–99)
Potassium: 4.2 mmol/L (ref 3.5–5.1)
Sodium: 140 mmol/L (ref 135–145)
Total Bilirubin: 1.3 mg/dL — ABNORMAL HIGH (ref 0.3–1.2)
Total Protein: 7.5 g/dL (ref 6.5–8.1)

## 2022-10-03 MED ORDER — SENNOSIDES-DOCUSATE SODIUM 8.6-50 MG PO TABS: 1.0000 | ORAL_TABLET | Freq: Every evening | ORAL | Status: DC | PRN

## 2022-10-03 MED ORDER — SODIUM CHLORIDE 0.9 % IV SOLN
2.0000 g | INTRAVENOUS | Status: DC
  Administered 2022-10-03 – 2022-10-06 (×4): 2 g via INTRAVENOUS
  Filled 2022-10-03 (×4): qty 20

## 2022-10-03 MED ORDER — ACETAMINOPHEN 325 MG PO TABS: 650.0000 mg | ORAL_TABLET | Freq: Four times a day (QID) | ORAL | Status: DC | PRN

## 2022-10-03 MED ORDER — ACETAMINOPHEN 650 MG RE SUPP: 650.0000 mg | RECTAL | Status: DC | PRN

## 2022-10-03 MED ORDER — SODIUM CHLORIDE 0.9 % IV SOLN: INTRAVENOUS | Status: AC

## 2022-10-03 MED ORDER — ACETAMINOPHEN 325 MG PO TABS: 650.0000 mg | ORAL_TABLET | ORAL | Status: DC | PRN

## 2022-10-03 MED ORDER — FINASTERIDE 5 MG PO TABS
5.0000 mg | ORAL_TABLET | Freq: Every day | ORAL | Status: DC
  Administered 2022-10-03 – 2022-10-11 (×9): 5 mg via ORAL
  Filled 2022-10-03 (×9): qty 1

## 2022-10-03 MED ORDER — ACETAMINOPHEN 160 MG/5ML PO SOLN: 650.0000 mg | ORAL | Status: DC | PRN

## 2022-10-03 MED ORDER — ENOXAPARIN SODIUM 40 MG/0.4ML IJ SOSY
40.0000 mg | PREFILLED_SYRINGE | Freq: Every day | INTRAMUSCULAR | Status: DC
  Administered 2022-10-03 – 2022-10-10 (×8): 40 mg via SUBCUTANEOUS
  Filled 2022-10-03 (×8): qty 0.4

## 2022-10-03 MED ORDER — ENSURE ENLIVE PO LIQD
237.0000 mL | Freq: Three times a day (TID) | ORAL | Status: DC
  Administered 2022-10-03 – 2022-10-11 (×22): 237 mL via ORAL

## 2022-10-03 MED ORDER — CLOPIDOGREL BISULFATE 75 MG PO TABS
75.0000 mg | ORAL_TABLET | Freq: Every day | ORAL | Status: DC
  Administered 2022-10-03 – 2022-10-11 (×9): 75 mg via ORAL
  Filled 2022-10-03 (×9): qty 1

## 2022-10-03 MED ORDER — ATORVASTATIN CALCIUM 80 MG PO TABS
80.0000 mg | ORAL_TABLET | Freq: Every day | ORAL | Status: DC
  Administered 2022-10-03 – 2022-10-10 (×8): 80 mg via ORAL
  Filled 2022-10-03: qty 1
  Filled 2022-10-03 (×2): qty 4
  Filled 2022-10-03: qty 1
  Filled 2022-10-03: qty 4
  Filled 2022-10-03: qty 1
  Filled 2022-10-03: qty 4
  Filled 2022-10-03: qty 1
  Filled 2022-10-03 (×2): qty 4
  Filled 2022-10-03 (×3): qty 1
  Filled 2022-10-03: qty 4
  Filled 2022-10-03 (×2): qty 1

## 2022-10-03 MED ORDER — TAMSULOSIN HCL 0.4 MG PO CAPS
0.4000 mg | ORAL_CAPSULE | Freq: Every day | ORAL | Status: DC
  Administered 2022-10-03 – 2022-10-11 (×9): 0.4 mg via ORAL
  Filled 2022-10-03 (×9): qty 1

## 2022-10-03 MED ORDER — STROKE: EARLY STAGES OF RECOVERY BOOK: Freq: Once | Status: DC

## 2022-10-03 MED ORDER — METOPROLOL TARTRATE 25 MG PO TABS
25.0000 mg | ORAL_TABLET | Freq: Two times a day (BID) | ORAL | Status: DC
  Administered 2022-10-03 – 2022-10-11 (×16): 25 mg via ORAL
  Filled 2022-10-03 (×17): qty 1

## 2022-10-03 MED ORDER — ASPIRIN 81 MG PO TBEC
81.0000 mg | DELAYED_RELEASE_TABLET | Freq: Every day | ORAL | Status: DC
  Administered 2022-10-03 – 2022-10-11 (×9): 81 mg via ORAL
  Filled 2022-10-03 (×9): qty 1

## 2022-10-03 MED ORDER — SODIUM CHLORIDE 0.9 % IV BOLUS
1000.0000 mL | Freq: Once | INTRAVENOUS | Status: AC
  Administered 2022-10-03: 1000 mL via INTRAVENOUS

## 2022-10-03 MED ORDER — LORAZEPAM 2 MG/ML IJ SOLN: 0.5000 mg | INTRAMUSCULAR | Status: DC | PRN

## 2022-10-03 NOTE — ED Notes (Signed)
Advised ready bed

## 2022-10-03 NOTE — ED Notes (Signed)
Cleaned skin tears to right arm, dressed with petroleum jelly soaked gauze. Wrapped with gauze and co-band. Cleaned site with NS. Approximated skin. Patient tolerated well.

## 2022-10-03 NOTE — ED Notes (Signed)
Attempted to have patient urinate in UA cup. Patient unable to produce urine.

## 2022-10-03 NOTE — Progress Notes (Signed)
SLP Cancellation Note  Patient Details Name: Koston Maione MRN: [id number] DOB: 08-12-1935   Cancelled treatment:       Reason Eval/Treat Not Completed:  (Attempted to see pt in ED09, pt OTF. Will continue efforts.)  Clyde Canterbury, M.S., CCC-SLP Speech-Language Pathologist [facility] [phone] (ASCOM)  Woodroe Chen 10/03/2022, 1:11 PM

## 2022-10-03 NOTE — Evaluation (Signed)
Physical Therapy Evaluation Patient Details Name: Kevin Bradshaw MRN: [id number] DOB: 02/14/1935 Today's Date: 10/03/2022  History of Present Illness  Pt admitted for complaints of confusion and ruling out for CVA. History of frequent falls, HTN, HLD, and Pakinsons disease.  Clinical Impression  Pt is a pleasant 87 year old male who was admitted for possible CVA. Pt performs bed mobility/transfers with mod assist and not appropriate for further ambulation at this time due to lethargy. Does require HHA, would benefit from trial use of RW next session. Pt confused and is poor historian, most of history obtained from chart. Pt demonstrates deficits with strength/mobility/balance. Pt with poor effort in MMT, however exam not revealing of true difference L vs R. Further testing deferred secondary to urgent bathroom need. Would benefit from skilled PT to address above deficits and promote optimal return to PLOF. Pt will continue to receive skilled PT services while admitted and will defer to TOC/care team for updates regarding disposition planning.         If plan is discharge home, recommend the following: A lot of help with walking and/or transfers;A lot of help with bathing/dressing/bathroom   Can travel by private vehicle   No    Equipment Recommendations  (TBD)  Recommendations for Other Services       Functional Status Assessment Patient has had a recent decline in their functional status and demonstrates the ability to make significant improvements in function in a reasonable and predictable amount of time.     Precautions / Restrictions Precautions Precautions: Fall Restrictions Weight Bearing Restrictions: No      Mobility  Bed Mobility Overal bed mobility: Needs Assistance Bed Mobility: Supine to Sit     Supine to sit: Mod assist     General bed mobility comments: needs assist for trunkal elevation. once seated at EOB, leaning noted towards Left side. Able to correct  with cueing    Transfers Overall transfer level: Needs assistance Equipment used: 1 person hand held assist Transfers: Bed to chair/wheelchair/BSC     Step pivot transfers: Mod assist       General transfer comment: Pt able to perform SPT from bed-BSC. Needs assist for shuffling gait and heavy cues for sequencing. Forward flexed posture    Ambulation/Gait               General Gait Details: pt very lethargic, not appropraite for further mobility and is requesting to return back to bed.  Stairs            Wheelchair Mobility     Tilt Bed    Modified Rankin (Stroke Patients Only)       Balance Overall balance assessment: Needs assistance, History of Falls Sitting-balance support: Feet supported Sitting balance-Leahy Scale: Good     Standing balance support: Bilateral upper extremity supported Standing balance-Leahy Scale: Poor                               Pertinent Vitals/Pain Pain Assessment Pain Assessment: No/denies pain    Home Living Family/patient expects to be discharged to:: Private residence Living Arrangements: Children Available Help at Discharge: Family;Available PRN/intermittently Type of Home: House Home Access: Stairs to enter Entrance Stairs-Rails: None Entrance Stairs-Number of Steps: 2-3   Home Layout: One level Home Equipment: Agricultural consultant (2 wheels);Wheelchair - manual Additional Comments: pt is confused and poor historian, home living obtained from previous admission    Prior Function Prior  Level of Function : Needs assist;History of Falls (last six months)             Mobility Comments: patient uses wheelchair, but is able to walk short distances with RW ADLs Comments: Pt reports sink bathing from wheelchair level. Family assists with IADLs     Extremity/Trunk Assessment   Upper Extremity Assessment Upper Extremity Assessment: Generalized weakness    Lower Extremity Assessment Lower Extremity  Assessment: Generalized weakness (B LE grossly 3+/5)       Communication   Communication Communication: No apparent difficulties  Cognition Arousal: Lethargic Behavior During Therapy: WFL for tasks assessed/performed Overall Cognitive Status: Impaired/Different from baseline                                 General Comments: pleasant and easily awakens, however is poor historian. Is alert to self and place        General Comments      Exercises Other Exercises Other Exercises: pt with urgent need to use bathroom. Assised with SPT from bed->BSC. Needs heavy cues for sequencing   Assessment/Plan    PT Assessment Patient needs continued PT services  PT Problem List Decreased strength;Decreased balance;Decreased mobility;Decreased cognition       PT Treatment Interventions DME instruction;Gait training;Therapeutic exercise;Balance training    PT Goals (Current goals can be found in the Care Plan section)  Acute Rehab PT Goals Patient Stated Goal: to go home PT Goal Formulation: With patient Time For Goal Achievement: 10/17/22 Potential to Achieve Goals: Good    Frequency Min 1X/week     Co-evaluation               AM-PAC PT "6 Clicks" Mobility  Outcome Measure Help needed turning from your back to your side while in a flat bed without using bedrails?: A Little Help needed moving from lying on your back to sitting on the side of a flat bed without using bedrails?: A Lot Help needed moving to and from a bed to a chair (including a wheelchair)?: A Lot Help needed standing up from a chair using your arms (e.g., wheelchair or bedside chair)?: A Lot Help needed to walk in hospital room?: Total Help needed climbing 3-5 steps with a railing? : Total 6 Click Score: 11    End of Session   Activity Tolerance: Patient limited by lethargy Patient left: in bed;with bed alarm set Nurse Communication: Mobility status PT Visit Diagnosis: Unsteadiness on feet  (R26.81);Repeated falls (R29.6);Muscle weakness (generalized) (M62.81);History of falling (Z91.81);Difficulty in walking, not elsewhere classified (R26.2)    Time: 9604-5409 PT Time Calculation (min) (ACUTE ONLY): 15 min   Charges:   PT Evaluation $PT Eval Low Complexity: 1 Low   PT General Charges $$ ACUTE PT VISIT: 1 Visit         Elizabeth Palau, PT, DPT, GCS [phone]   Arif Amendola 10/03/2022, 3:03 PM

## 2022-10-03 NOTE — ED Notes (Signed)
Called dietary for breakfast tray.

## 2022-10-03 NOTE — ED Triage Notes (Signed)
Patient to ED [facility] from [facility] for multiple falls throughout the night. Pt is not on blood thinners. Patient did not hit his head. No head trauma. Two abrasion to right arm currently bandaged. Patient did report burning while urinating starting a week ago. Pt is ALOC X3.  Patient ALOC X3. Pt respirations unlabored, skin dry, not in acute distress.   VS from [facility]: 105/65, 75, 98% AM, 105G, 97.1 oral VS via EMS

## 2022-10-03 NOTE — ED Notes (Addendum)
Charted on wrong patient (please ignore this note)

## 2022-10-03 NOTE — ED Notes (Signed)
MD in patient room assessing patient at bedside.

## 2022-10-03 NOTE — ED Provider Notes (Signed)
[facility] Provider Note    Event Date/Time   First MD Initiated Contact with Patient 10/03/22 [phone]     (approximate)   History   Fall (Multiple falls throughout the night via EMS report)   HPI  Kevin Bradshaw is a 87 year old male with history of A-fib not on anticoagulation due to frequent falls, HTN, Parkinson's disease, prior CVA known recurrent falls presenting to the ER for evaluation after a fall.  EMS reported multiple falls throughout the night.  Patient tells me he thinks he rolled out of bed a couple times.  Does not think he hit his head.  Was noted to have 2 abrasions over his forearm.  No other injuries noted.  He does report that for the last month or so he has had some dysuria.  Patient currently without complaints.   I did review his discharge summary from March 2024.  At that time, he presented with severe dysarthria, left hemineglect and sensory deficit secondary to an acute right parietal infarct.  Stroke thought to be likely embolic in nature secondary to A-fib not on anticoagulation.  Discharged as a DNR to SNF.  Additional history obtained from patient's daughter who later presented to bedside.  Reports patient has had some worsening balance issues for a while, but when she saw him on Thursday he appeared to have unilateral facial droop and some increased difficulty with his speech.  She last saw him before that on Sunday when he was at his baseline.     Physical Exam   Triage Vital Signs: ED Triage Vitals  Encounter Vitals Group     BP 10/03/22 0738 95/65     Systolic BP Percentile --      Diastolic BP Percentile --      Pulse Rate 10/03/22 0730 83     Resp 10/03/22 0738 14     Temp 10/03/22 0730 98 F (36.7 C)     Temp Source 10/03/22 0730 Oral     SpO2 10/03/22 0730 98 %     Weight 10/03/22 0731 160 lb (72.6 kg)     Height 10/03/22 0731 5\' 3"  (1.6 m)     Head Circumference --      Peak Flow --      Pain Score 10/03/22 0731  0     Pain Loc --      Pain Education --      Exclude from Growth Chart --     Most recent vital signs: Vitals:   10/03/22 0815 10/03/22 0845  BP:  101/70  Pulse: (!) 59   Resp: 15   Temp:    SpO2: 98%    Nursing notes and vital signs reviewed.  General: Adult male, laying in bed, awake, reactive Head: Atraumatic Chest: Symmetric chest rise, no tenderness to palpation.  Cardiac: Regular rhythm and rate.  Respiratory: Lungs clear to auscultation Abdomen: Soft, nondistended. No tenderness to palpation.  Pelvis: Stable in AP and lateral compression. No tenderness to palpation. MSK: No deformity to bilateral upper and lower extremity. Full range of motion to bilateral upper lower extremity without pain.  Skin tears over the right elbow and forearm noted, bleeding controlled. Neuro: Alert, oriented to self and place.  Thinks the month is December and age is 63.  Following commands.  Normal horizontal extraocular movements.  No appreciable field cut.  No appreciable facial asymmetry on my evaluation, no arm drift.  Difficulty holding bilateral lower extremities antigravity.  No appreciable  limb ataxia.  Intact sensation.  No aphasia.  Possible mild dysarthria.  No inattention. Skin: No evidence of burns or lacerations.  ED Results / Procedures / Treatments   Labs (all labs ordered are listed, but only abnormal results are displayed) Labs Reviewed  URINALYSIS, ROUTINE W REFLEX MICROSCOPIC - Abnormal; Notable for the following components:      Result Value   Color, Urine YELLOW (*)    APPearance TURBID (*)    Protein, ur >=300 (*)    Nitrite POSITIVE (*)    Leukocytes,Ua MODERATE (*)    Bacteria, UA MANY (*)    All other components within normal limits  COMPREHENSIVE METABOLIC PANEL - Abnormal; Notable for the following components:   Total Bilirubin 1.3 (*)    All other components within normal limits  CBC WITH DIFFERENTIAL/PLATELET     EKG EKG independently reviewed  interpreted by myself (ER attending) demonstrates:  EKG demonstrates A-fib versus flutter with variable conduction at a rate of 75, PR 143, QTc 503  RADIOLOGY Imaging independently reviewed and interpreted by myself demonstrates:  CT head without acute bleed, but does demonstrate an acute to subacute left parietal infarct with remote right infarct. CT C-spine without acute fracture  PROCEDURES:  Critical Care performed: No  Procedures   MEDICATIONS ORDERED IN ED: Medications  sodium chloride 0.9 % bolus 1,000 mL (has no administration in time range)  cefTRIAXone (ROCEPHIN) 2 g in sodium chloride 0.9 % 100 mL IVPB (has no administration in time range)     IMPRESSION / MDM / ASSESSMENT AND PLAN / ED COURSE  I reviewed the triage vital signs and the nursing notes.  Differential diagnosis includes, but is not limited to, intracranial bleed, skull fracture, spine fracture, no evidence of thoracoabdominal trauma, UTI, anemia, electrolyte abnormality  Patient's presentation is most consistent with acute presentation with potential threat to life or bodily function.  87 year old male presenting to the emergency department after multiple reported falls with known history of similar.  Vital signs stable on presentation.  No obvious injuries, but with age and reported multiple falls will obtain CT head and C-spine, check basic labs, and EKG.  CT head without acute traumatic injury, but is concerning for recent stroke.  On reevaluation, family was present who reported that patient appeared to have a facial droop when they saw him on Thursday with increased speech difficulty.  Last known well on Sunday.  Patient outside the window for intervention.  Is at increased risk given his A-fib not on anticoagulation.  His urinalysis is also concerning for infection, most recent urine culture in our system pansensitive, ordered for Rocephin, does not meet sepsis criteria.  Discussed results of workup with  patient and family.  They are agreeable with admission for further evaluation and management in the setting of acute to subacute stroke, UTI, frequent falls.  Will reach out to hospitalist team for admission.  Reviewed with hospitalist team.  They will evaluate patient for anticipated admission.     FINAL CLINICAL IMPRESSION(S) / ED DIAGNOSES   Final diagnoses:  Cerebrovascular accident (CVA), unspecified mechanism (HCC)  Acute cystitis without hematuria  Arm abrasion, right, initial encounter     Rx / DC Orders   ED Discharge Orders     None        Note:  This document was prepared using Dragon voice recognition software and may include unintentional dictation errors.   Trinna Post, MD 10/03/22 [phone]

## 2022-10-03 NOTE — ED Notes (Signed)
Pt returned from CT °

## 2022-10-03 NOTE — ED Notes (Signed)
Patient transferred to CT

## 2022-10-03 NOTE — ED Notes (Signed)
Patient daughter/family at bedside.

## 2022-10-03 NOTE — H&P (Signed)
History and Physical    Kevin Bradshaw QIO:[id number] DOB: 29-Oct-1935 DOA: 10/03/2022  PCP: Gracelyn Nurse, MD (Confirm with patient/family/NH records and if not entered, this has to be entered at [facility] Main point of entry) Patient coming from: Assisted living  I have personally briefly reviewed patient's old medical records in [facility]  Chief Complaint: Patient is confused  HPI: Kevin Bradshaw is a 87 y.o. male with medical history significant of chronic A-fib not on anticoagulation due to frequent falls, HTN, HLD, Parkinson's disease with chronic ambulation impairment and frequent falls, multiple CVAs since 2019 on aspirin and Plavix, brought in by family member for evaluation of confusion.  3 days ago family started noticed that the patient developed right-sided facial droop and slurred speech but did not seek any medical advice.  And this morning, family visited patient again and found the patient very confused and decided to bring him in.  Patient is confused but denied any pain and the following simple commands.  ED Course: Blood pressure borderline low, none tachycardia nonhypoxic afebrile.  CT head showed acute/subacute left parietal CVA, UA showed proteinuria 3+ and > 50 WBC and > 50 RBC.  Patient was started on ceftriaxone in the ED.  Review of Systems: Unable to perform, patient is confused.  Past Medical History:  Diagnosis Date   Diastolic dysfunction    a. 09/2018 Echo: EF 55-60%, mild LVH. Diast dysfxn. Nl RV fxn. Nl LA size.   Falls    Hyperlipidemia    Hypertension    PAF (paroxysmal atrial fibrillation) (HCC)    a. 09/2018 in the setting of urosepsis; b. CHA2DS2VASc = 5-->No OAC due to high risk for falls (on ASA/Plavix).   Parkinson disease    Stroke (HCC) 08/2017   Urosepsis (HCC) 09/2018    Past Surgical History:  Procedure Laterality Date   CARDIOVASCULAR STRESS TEST     normal   CARPAL TUNNEL RELEASE  2009   right hand   X-STOP IMPLANTATION      X-STOP IMPLANTATION     Dr. Gerrit Heck     reports that he quit smoking about 19 years ago. His smoking use included cigarettes. He started smoking about 69 years ago. He has a 75 pack-year smoking history. He has never used smokeless tobacco. He reports current alcohol use. He reports that he does not use drugs.  No Known Allergies  Family History  Problem Relation Age of Onset   Heart disease Father 87   Cancer Sister        ? type   Heart disease Brother    Cancer Brother        ? type   Stroke Child    Seizures Child      Prior to Admission medications   Medication Sig Start Date End Date Taking? Authorizing Provider  acetaminophen (TYLENOL) 325 MG tablet Take 2 tablets (650 mg total) by mouth every 6 (six) hours as needed for mild pain (or Fever >/= 101). 02/21/18  Yes Gouru, Deanna Artis, MD  aspirin 81 MG tablet Take 81 mg by mouth daily.     Yes [provider]  atorvastatin (LIPITOR) 80 MG tablet Take 1 tablet (80 mg total) by mouth daily at 6 PM. 08/23/17  Yes Shaune Pollack, MD  clopidogrel (PLAVIX) 75 MG tablet Take 1 tablet (75 mg total) by mouth daily. 08/23/17  Yes Shaune Pollack, MD  finasteride (PROSCAR) 5 MG tablet Take 5 mg by mouth daily.   Yes [provider]  metoprolol succinate (TOPROL-XL) 50 MG 24 hr tablet Take 50 mg by mouth daily.   Yes [provider]  Potassium Chloride ER 20 MEQ TBCR Take 1 tablet by mouth daily. 08/16/22  Yes [provider]  senna-docusate (SENOKOT-S) 8.6-50 MG tablet Take 1 tablet by mouth at bedtime as needed for mild constipation. 04/01/22  Yes Sunnie Nielsen, DO  tamsulosin (FLOMAX) 0.4 MG CAPS capsule Take 0.4 mg by mouth daily. 12/26/20  Yes [provider]  ezetimibe (ZETIA) 10 MG tablet Take 1 tablet (10 mg total) by mouth daily. Patient not taking: Reported on 10/03/2022 04/01/22   Sunnie Nielsen, DO  feeding supplement (ENSURE ENLIVE / ENSURE PLUS) LIQD Take 237 mLs by mouth 3 (three) times daily  between meals. 04/01/22   Sunnie Nielsen, DO  ferrous sulfate 325 (65 FE) MG EC tablet Take 325 mg by mouth daily with breakfast. Patient not taking: Reported on 10/03/2022 10/13/20   [provider]  metoprolol succinate (TOPROL-XL) 25 MG 24 hr tablet Take 1 tablet (25 mg total) by mouth daily. Patient not taking: Reported on 10/03/2022 04/01/22   Sunnie Nielsen, DO  Multiple Vitamin (MULTIVITAMIN WITH MINERALS) TABS tablet Take 1 tablet by mouth daily. Patient not taking: Reported on 10/03/2022 04/01/22   Sunnie Nielsen, DO  pantoprazole (PROTONIX) 40 MG tablet Take 1 tablet (40 mg total) by mouth at bedtime. Patient not taking: Reported on 10/03/2022 04/01/22   Sunnie Nielsen, DO  Calcium Carbonate (CALCIUM 500 PO) Take 2 tablets by mouth daily.    01/06/16  [provider]    Physical Exam: Vitals:   10/03/22 0738 10/03/22 0815 10/03/22 0845 10/03/22 0920  BP: 95/65  101/70 (!) 82/53  Pulse: 84 (!) 59  70  Resp: 14 15  16   Temp: 98 F (36.7 C)   98 F (36.7 C)  TempSrc:    Oral  SpO2: 99% 98%  97%  Weight:      Height:        Constitutional: NAD, calm, comfortable Vitals:   10/03/22 0738 10/03/22 0815 10/03/22 0845 10/03/22 0920  BP: 95/65  101/70 (!) 82/53  Pulse: 84 (!) 59  70  Resp: 14 15  16   Temp: 98 F (36.7 C)   98 F (36.7 C)  TempSrc:    Oral  SpO2: 99% 98%  97%  Weight:      Height:       Eyes: PERRL, lids and conjunctivae normal ENMT: Mucous membranes are moist. Posterior pharynx clear of any exudate or lesions.Normal dentition.  Neck: normal, supple, no masses, no thyromegaly Respiratory: clear to auscultation bilaterally, no wheezing, no crackles. Normal respiratory effort. No accessory muscle use.  Cardiovascular: Regular rate and rhythm, no murmurs / rubs / gallops. No extremity edema. 2+ pedal pulses. No carotid bruits.  Abdomen: no tenderness, no masses palpated. No hepatosplenomegaly. Bowel sounds positive.   Musculoskeletal: no clubbing / cyanosis. No joint deformity upper and lower extremities. Good ROM, no contractures. Normal muscle tone.  Skin: no rashes, lesions, ulcers. No induration Neurologic: Slight right-sided facial droop.  Muscle strength appears to be equal bilaterally Psychiatric: Awake, confused.    Labs on Admission: I have personally reviewed following labs and imaging studies  CBC: Recent Labs  Lab 10/03/22 0750  WBC 5.1  NEUTROABS 3.3  HGB 14.7  HCT 45.6  MCV 95.4  PLT 207   Basic Metabolic Panel: Recent Labs  Lab 10/03/22 0750  NA 140  K 4.2  CL 106  CO2 27  GLUCOSE 98  BUN 18  CREATININE 1.01  CALCIUM 9.3   GFR: Estimated Creatinine Clearance: 47.8 mL/min (by C-G formula based on SCr of 1.01 mg/dL). Liver Function Tests: Recent Labs  Lab 10/03/22 0750  AST 30  ALT 20  ALKPHOS 77  BILITOT 1.3*  PROT 7.5  ALBUMIN 3.7   No results for input(s): "LIPASE", "AMYLASE" in the last 168 hours. No results for input(s): "AMMONIA" in the last 168 hours. Coagulation Profile: No results for input(s): "INR", "PROTIME" in the last 168 hours. Cardiac Enzymes: No results for input(s): "CKTOTAL", "CKMB", "CKMBINDEX", "TROPONINI" in the last 168 hours. BNP (last 3 results) No results for input(s): "PROBNP" in the last 8760 hours. HbA1C: No results for input(s): "HGBA1C" in the last 72 hours. CBG: No results for input(s): "GLUCAP" in the last 168 hours. Lipid Profile: No results for input(s): "CHOL", "HDL", "LDLCALC", "TRIG", "CHOLHDL", "LDLDIRECT" in the last 72 hours. Thyroid Function Tests: No results for input(s): "TSH", "T4TOTAL", "FREET4", "T3FREE", "THYROIDAB" in the last 72 hours. Anemia Panel: No results for input(s): "VITAMINB12", "FOLATE", "FERRITIN", "TIBC", "IRON", "RETICCTPCT" in the last 72 hours. Urine analysis:    Component Value Date/Time   COLORURINE YELLOW (A) 10/03/2022 0845   APPEARANCEUR TURBID (A) 10/03/2022 0845   LABSPEC 1.016  10/03/2022 0845   PHURINE 8.0 10/03/2022 0845   GLUCOSEU NEGATIVE 10/03/2022 0845   HGBUR NEGATIVE 10/03/2022 0845   BILIRUBINUR NEGATIVE 10/03/2022 0845   KETONESUR NEGATIVE 10/03/2022 0845   PROTEINUR >=300 (A) 10/03/2022 0845   NITRITE POSITIVE (A) 10/03/2022 0845   LEUKOCYTESUR MODERATE (A) 10/03/2022 0845    Radiological Exams on Admission: CT Head Wo Contrast  Result Date: 10/03/2022 CLINICAL DATA:  Fall with arm abrasions. EXAM: CT HEAD WITHOUT CONTRAST CT CERVICAL SPINE WITHOUT CONTRAST TECHNIQUE: Multidetector CT imaging of the head and cervical spine was performed following the standard protocol without intravenous contrast. Multiplanar CT image reconstructions of the cervical spine were also generated. RADIATION DOSE REDUCTION: This exam was performed according to the departmental dose-optimization program which includes automated exposure control, adjustment of the mA and/or kV according to patient size and/or use of iterative reconstruction technique. COMPARISON:  04/15/2022 head CT. FINDINGS: CT HEAD FINDINGS Brain: Cytotoxic edema in the left parietal lobe involving a moderate area, interval and recent appearing. Chronic right frontal parietal cortex infarct. Expected evolution of small superior right frontal cortex infarct seen on prior. Cerebral volume loss with ventriculomegaly. There is disproportionate subarachnoid spaces although the degree of ventriculomegaly is fairly congruous with a degree of atrophy. Vascular: No hyperdense vessel or unexpected calcification. Skull: Normal. Negative for fracture or focal lesion. Sinuses/Orbits: Negative CT CERVICAL SPINE FINDINGS Alignment: No traumatic malalignment Skull base and vertebrae: No acute fracture Soft tissues and spinal canal: No prevertebral fluid or swelling. No visible canal hematoma. Suboccipital scarring. Disc levels: Advanced and diffuse degenerative spurring. 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 Upper chest: No  acute finding Other: IMPRESSION: Head CT: 1. Acute or subacute left parietal infarct involving a moderate area. 2. Remote right MCA branch infarcts. 3. No evidence of intracranial injury. Cervical spine: Negative for fracture or subluxation. Electronically Signed   By: Tiburcio Pea M.D.   On: 10/03/2022 08:54   CT Cervical Spine Wo Contrast  Result Date: 10/03/2022 CLINICAL DATA:  Fall with arm abrasions. EXAM: CT HEAD WITHOUT CONTRAST CT CERVICAL SPINE WITHOUT CONTRAST TECHNIQUE: Multidetector CT imaging of the head and cervical spine was performed following the standard protocol without  intravenous contrast. Multiplanar CT image reconstructions of the cervical spine were also generated. RADIATION DOSE REDUCTION: This exam was performed according to the departmental dose-optimization program which includes automated exposure control, adjustment of the mA and/or kV according to patient size and/or use of iterative reconstruction technique. COMPARISON:  04/15/2022 head CT. FINDINGS: CT HEAD FINDINGS Brain: Cytotoxic edema in the left parietal lobe involving a moderate area, interval and recent appearing. Chronic right frontal parietal cortex infarct. Expected evolution of small superior right frontal cortex infarct seen on prior. Cerebral volume loss with ventriculomegaly. There is disproportionate subarachnoid spaces although the degree of ventriculomegaly is fairly congruous with a degree of atrophy. Vascular: No hyperdense vessel or unexpected calcification. Skull: Normal. Negative for fracture or focal lesion. Sinuses/Orbits: Negative CT CERVICAL SPINE FINDINGS Alignment: No traumatic malalignment Skull base and vertebrae: No acute fracture Soft tissues and spinal canal: No prevertebral fluid or swelling. No visible canal hematoma. Suboccipital scarring. Disc levels: Advanced and diffuse degenerative spurring. 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 Upper chest: No acute finding Other: IMPRESSION:  Head CT: 1. Acute or subacute left parietal infarct involving a moderate area. 2. Remote right MCA branch infarcts. 3. No evidence of intracranial injury. Cervical spine: Negative for fracture or subluxation. Electronically Signed   By: Tiburcio Pea M.D.   On: 10/03/2022 08:54    EKG: Independently reviewed.  A-fib, no acute ST changes.  Assessment/Plan Principal Problem:   CVA (cerebral vascular accident) [facility]) Active Problems:   Atrial fibrillation with RVR (HCC)   Acute CVA (cerebrovascular accident) (HCC)   UTI (urinary tract infection)  (please populate well all problems here in Problem List. (For example, if patient is on BP meds at home and you resume or decide to hold them, it is a problem that needs to be her. Same for CAD, COPD, HLD and so on)  Acute/subacute left parietal CVA -Slight right-sided facial droop and suspected dysarthria -Outside of any intervention window.  Discussed with family members including daughter and son at bedside, patient has significant increasing CVA risk factor secondary to undertreated A-fib anticoagulation regimen, family understand that the patient has had multiple falls and thus risk of intracranial bleed outweigh CVA protection.  Family agreed with continue current dual antiplatelet aspirin Plavix regimen. -Allow permissive hypertension -Echocardiogram was done 6 months ago and not other CVA workup, will not repeat at this point. CTA head and neck was done on last admission in March will not repeat at this point.  MRI ordered -PT OT and speech evaluation  Metabolic encephalopathy acute -Secondary to UTI -Continue ceftriaxone, urine culture pending -Check PVR -Continue BPH medications  Chronic A-fib -Rate controlled, continue metoprolol, continue aspirin Plavix  HTN -Continue metoprolol, hold off other BP meds  BPH -Check PVR, continue Flomax and Proscar  Parkinson's disease and frequent falls -Outpatient follow-up with  neurology    DVT prophylaxis: Lovenox Code Status: DNR Family Communication: Family at bedside Disposition Plan: Patient is sick with stroke and UTI with significant deconditioning, requiring inpatient stroke workup and inpatient PT evaluation, expect more than 2 midnight hospital stay Consults called: None Admission status: Telemetry admission   Emeline General MD [facility] Pager [phone]  10/03/2022, 10:51 AM

## 2022-10-04 DIAGNOSIS — G9341 Metabolic encephalopathy: Secondary | ICD-10-CM

## 2022-10-04 LAB — LIPID PANEL
Cholesterol: 124 mg/dL (ref 0–200)
HDL: 39 mg/dL — ABNORMAL LOW (ref >40)
LDL Cholesterol: 69 mg/dL (ref 0–99)
Total CHOL/HDL Ratio: 3.2 RATIO
Triglycerides: 79 mg/dL (ref <150)
VLDL: 16 mg/dL (ref 0–40)

## 2022-10-04 LAB — HEMOGLOBIN A1C
Hgb A1c MFr Bld: 6 % — ABNORMAL HIGH (ref 4.8–5.6)
Mean Plasma Glucose: 125.5 mg/dL

## 2022-10-04 MED ORDER — LACTATED RINGERS IV SOLN: INTRAVENOUS | Status: DC

## 2022-10-04 MED ORDER — HALOPERIDOL LACTATE 5 MG/ML IJ SOLN
2.0000 mg | Freq: Four times a day (QID) | INTRAMUSCULAR | Status: DC | PRN
  Administered 2022-10-05: 2 mg via INTRAMUSCULAR
  Filled 2022-10-04: qty 1

## 2022-10-04 MED ORDER — HALOPERIDOL 0.5 MG PO TABS
2.0000 mg | ORAL_TABLET | Freq: Four times a day (QID) | ORAL | Status: DC | PRN
  Administered 2022-10-04 – 2022-10-11 (×4): 2 mg via ORAL
  Filled 2022-10-04 (×5): qty 4

## 2022-10-04 NOTE — TOC Initial Note (Signed)
Transition of Care [facility]) - Initial/Assessment Note    Patient Details  Name: Kevin Bradshaw MRN: [id number] Date of Birth: 09-12-1935  Transition [facility]) CM/SW Contact:    Kevin Katz, LCSW Phone Number: 10/04/2022, 11:35 AM  Clinical Narrative:    Pt only oriented to self. POA and daughter Kevin Bradshaw reports she does not want patient to go to a facility. Pt is from [facility] and is wheelchair level at [employer]. She reports she feels pt will be able to transfer himself, which is what he was doing at baseline, once his UTI clears. She would like for [facility] to come and assess if patient is able to return with [facility].                Expected Discharge Plan: Assisted Living Barriers to Discharge: Continued Medical Work up   Patient Goals and CMS Choice            Expected Discharge Plan and Services     Post Acute Care Choice: Durable Medical Equipment Living arrangements for the past 2 months: Assisted Living Facility                                      Prior Living Arrangements/Services Living arrangements for the past 2 months: Assisted Living Facility Lives with:: Facility Resident   Do you feel safe going back to the place where you live?: Yes      Need for Family Participation in Patient Care: Yes (Comment) Care giver support system in place?: Yes (comment) Current home services: DME    Activities of Daily Living      Permission Sought/Granted            Permission granted to share info w Relationship: Kevin Bradshaw, [id number]     Emotional Assessment       Orientation: : Oriented to Self      Admission diagnosis:  CVA (cerebral vascular accident) (HCC) [I63.9] Acute cystitis without hematuria [N30.00] Arm abrasion, right, initial encounter [S40.811A] Cerebrovascular accident (CVA), unspecified mechanism (HCC) [I63.9] Patient Active Problem List   Diagnosis Date Noted   Dysarthria 03/25/2022   Persistent atrial fibrillation  (HCC) 03/25/2022   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 Jerilynn M[place]) [place] [address] Phone: [phone] Fax: [phone]  [facility] - [place], [place] - [id number], [address] [place] [address] Phone: [phone] Fax: [phone]  [id number] - [place], [place] - [address] [place] [address] Phone: [phone] Fax: [phone]     Social Determinants of Health (SDOH) Social History: SDOH Screenings   Tobacco Use: Medium Risk (10/03/2022)   SDOH Interventions:     Readmission Risk Interventions     No data to display

## 2022-10-04 NOTE — Progress Notes (Signed)
PROGRESS NOTE    Kevin Bradshaw  RJJ:[id number] DOB: 12/20/1935 DOA: 10/03/2022 PCP: Gracelyn Nurse, MD    Brief Narrative:  87 y.o. male with medical history significant of chronic A-fib not on anticoagulation due to frequent falls, HTN, HLD, Parkinson's disease with chronic ambulation impairment and frequent falls, multiple CVAs since 2019 on aspirin and Plavix, brought in by family member for evaluation of confusion.   3 days ago family started noticed that the patient developed right-sided facial droop and slurred speech but did not seek any medical advice.  And this morning, family visited patient again and found the patient very confused and decided to bring him in.  Patient is confused but denied any pain and the following simple commands.  9/23: MRI reviewed with patient and family member at bedside.  No evidence of CVA.  Suspected altered mentation secondary to UTI.   Assessment & Plan:   Principal Problem:   CVA (cerebral vascular accident) [facility]) Active Problems:   Atrial fibrillation with RVR (HCC)   Acute CVA (cerebrovascular accident) (HCC)   UTI (urinary tract infection)  UTI Acute metabolic encephalopathy secondary to above CVA, ruled out Patient presented with right-sided facial droop and dysarthria.  I suspect this is representative of worsening of his underlying neurologic deficits in the setting of acute UTI. 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  Patient on DAPT aspirin and Plavix.  Will continue.  Outpatient follow-up with neurology.  No evidence of acute CVA.  PT OT evaluations  Chronic atrial fibrillation Not on anticoagulation presumably secondary to falls.  Is on aspirin and Plavix.  Will continue.  Essential hypertension Metoprolol resumed.  Hold remainder  blood pressure medications for now.  Restart as appropriate.  BPH Flomax and Proscar    DVT prophylaxis: SQ Lovenox Code Status: DNR Family Communication: Daughter at bedside 9/23 Disposition Plan: Status is: Inpatient Remains inpatient appropriate because: UTI and associated metabolic encephalopathy   Level of care: Telemetry Medical  Consultants:  None  Procedures:  None  Antimicrobials: Ceftriaxone   Subjective: Seen and examined.  Sitting up in chair.  Daughter at bedside.  No visible distress.  No pain complaints.  Objective: Vitals:   10/03/22 1958 10/03/22 2357 10/04/22 0358 10/04/22 0758  BP: 118/76 112/62 (!) 144/76 106/79  Pulse: 72 61 (!) 45 70  Resp: 16 18 16 15   Temp: 98.5 F (36.9 C) 98.2 F (36.8 C) 97.8 F (36.6 C) 98.6 F (37 C)  TempSrc: Oral   Oral  SpO2: 94% 96% 94%   Weight:      Height:        Intake/Output Summary (Last 24 hours) at 10/04/2022 1109 Last data filed at 10/04/2022 0555 Gross per 24 hour  Intake 1000 ml  Output 600 ml  Net 400 ml   Filed Weights   10/03/22 0731 10/03/22 1750  Weight: 72.6 kg 63.4 kg    Examination:  General exam: Appears calm and comfortable  Respiratory system: Clear to auscultation. Respiratory effort normal. Cardiovascular system: S1-2, regular rate, irregular rhythm, no murmurs, no pedal edema Gastrointestinal system: Soft, NT/ND, normal bowel sounds Central nervous system: Alert.  Oriented x 2.  No focal deficits Extremities: Symmetric 5 x 5 power.  Gait not assessed Skin: No rashes, lesions or ulcers Psychiatry: Judgement and insight appear normal. Mood & affect  appropriate.     Data Reviewed: I have personally reviewed following labs and imaging studies  CBC: Recent Labs  Lab 10/03/22 0750  WBC 5.1  NEUTROABS 3.3  HGB 14.7  HCT 45.6  MCV 95.4  PLT 207   Basic Metabolic Panel: Recent Labs  Lab 10/03/22 0750  NA 140  K 4.2  CL 106  CO2 27  GLUCOSE 98  BUN 18   CREATININE 1.01  CALCIUM 9.3   GFR: Estimated Creatinine Clearance: 43 mL/min (by C-G formula based on SCr of 1.01 mg/dL). Liver Function Tests: Recent Labs  Lab 10/03/22 0750  AST 30  ALT 20  ALKPHOS 77  BILITOT 1.3*  PROT 7.5  ALBUMIN 3.7   No results for input(s): "LIPASE", "AMYLASE" in the last 168 hours. No results for input(s): "AMMONIA" in the last 168 hours. Coagulation Profile: No results for input(s): "INR", "PROTIME" in the last 168 hours. Cardiac Enzymes: No results for input(s): "CKTOTAL", "CKMB", "CKMBINDEX", "TROPONINI" in the last 168 hours. BNP (last 3 results) No results for input(s): "PROBNP" in the last 8760 hours. HbA1C: Recent Labs    10/03/22 0750  HGBA1C 6.0*   CBG: No results for input(s): "GLUCAP" in the last 168 hours. Lipid Profile: Recent Labs    10/04/22 0426  CHOL 124  HDL 39*  LDLCALC 69  TRIG 79  CHOLHDL 3.2   Thyroid Function Tests: No results for input(s): "TSH", "T4TOTAL", "FREET4", "T3FREE", "THYROIDAB" in the last 72 hours. Anemia Panel: No results for input(s): "VITAMINB12", "FOLATE", "FERRITIN", "TIBC", "IRON", "RETICCTPCT" in the last 72 hours. Sepsis Labs: No results for input(s): "PROCALCITON", "LATICACIDVEN" in the last 168 hours.  No results found for this or any previous visit (from the past 240 hour(s)).       Radiology Studies: MR BRAIN WO CONTRAST  Result Date: 10/03/2022 CLINICAL DATA:  Stroke follow-up EXAM: MRI HEAD WITHOUT CONTRAST TECHNIQUE: Multiplanar, multiecho pulse sequences of the brain and surrounding structures were obtained without intravenous contrast. COMPARISON:  Head CT from earlier today FINDINGS: Brain: The interval left parietal infarct highlighted on prior head CT has features of late subacute to chronic timing by diffusion imaging with neutral to loss of volume and cortical laminar necrosis. Chronic right frontal and parietooccipital infarcts. Brain atrophy with ventriculomegaly.  Chronic blood products at sites of prior infarction. No mass, obstructive hydrocephalus, or collection Vascular: Major flow voids are preserved Skull and upper cervical spine: No focal marrow lesion. Advanced cervical spine degeneration with erosive changes of the dens where there is calcification, CPPD arthropathy could have this pattern. Nodular scarring in the suboccipital scalp. Sinuses/Orbits: Mild ethmoid sinus opacification. IMPRESSION: 1. The left parietal infarct highlighted on prior head CT is late subacute to chronic. 2. Brain atrophy and chronic right cerebral infarcts. Electronically Signed   By: Tiburcio Pea M.D.   On: 10/03/2022 12:16   CT Head Wo Contrast  Result Date: 10/03/2022 CLINICAL DATA:  Fall with arm abrasions. EXAM: CT HEAD WITHOUT CONTRAST CT CERVICAL SPINE WITHOUT CONTRAST TECHNIQUE: Multidetector CT imaging of the head and cervical spine was performed following the standard protocol without intravenous contrast. Multiplanar CT image reconstructions of the cervical spine were also generated. RADIATION DOSE REDUCTION: This exam was performed according to the departmental dose-optimization program which includes automated exposure control, adjustment of the mA and/or kV according to patient size and/or use of iterative reconstruction technique. COMPARISON:  04/15/2022 head CT. FINDINGS: CT HEAD FINDINGS Brain: Cytotoxic edema in the left parietal lobe involving a  moderate area, interval and recent appearing. Chronic right frontal parietal cortex infarct. Expected evolution of small superior right frontal cortex infarct seen on prior. Cerebral volume loss with ventriculomegaly. There is disproportionate subarachnoid spaces although the degree of ventriculomegaly is fairly congruous with a degree of atrophy. Vascular: No hyperdense vessel or unexpected calcification. Skull: Normal. Negative for fracture or focal lesion. Sinuses/Orbits: Negative CT CERVICAL SPINE FINDINGS Alignment: No  traumatic malalignment Skull base and vertebrae: No acute fracture Soft tissues and spinal canal: No prevertebral fluid or swelling. No visible canal hematoma. Suboccipital scarring. Disc levels: Advanced and diffuse degenerative spurring. 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 Upper chest: No acute finding Other: IMPRESSION: Head CT: 1. Acute or subacute left parietal infarct involving a moderate area. 2. Remote right MCA branch infarcts. 3. No evidence of intracranial injury. Cervical spine: Negative for fracture or subluxation. Electronically Signed   By: Tiburcio Pea M.D.   On: 10/03/2022 08:54   CT Cervical Spine Wo Contrast  Result Date: 10/03/2022 CLINICAL DATA:  Fall with arm abrasions. EXAM: CT HEAD WITHOUT CONTRAST CT CERVICAL SPINE WITHOUT CONTRAST TECHNIQUE: Multidetector CT imaging of the head and cervical spine was performed following the standard protocol without intravenous contrast. Multiplanar CT image reconstructions of the cervical spine were also generated. RADIATION DOSE REDUCTION: This exam was performed according to the departmental dose-optimization program which includes automated exposure control, adjustment of the mA and/or kV according to patient size and/or use of iterative reconstruction technique. COMPARISON:  04/15/2022 head CT. FINDINGS: CT HEAD FINDINGS Brain: Cytotoxic edema in the left parietal lobe involving a moderate area, interval and recent appearing. Chronic right frontal parietal cortex infarct. Expected evolution of small superior right frontal cortex infarct seen on prior. Cerebral volume loss with ventriculomegaly. There is disproportionate subarachnoid spaces although the degree of ventriculomegaly is fairly congruous with a degree of atrophy. Vascular: No hyperdense vessel or unexpected calcification. Skull: Normal. Negative for fracture or focal lesion. Sinuses/Orbits: Negative CT CERVICAL SPINE FINDINGS Alignment: No traumatic malalignment Skull base  and vertebrae: No acute fracture Soft tissues and spinal canal: No prevertebral fluid or swelling. No visible canal hematoma. Suboccipital scarring. Disc levels: Advanced and diffuse degenerative spurring. 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 Upper chest: No acute finding Other: IMPRESSION: Head CT: 1. Acute or subacute left parietal infarct involving a moderate area. 2. Remote right MCA branch infarcts. 3. No evidence of intracranial injury. Cervical spine: Negative for fracture or subluxation. Electronically Signed   By: Tiburcio Pea M.D.   On: 10/03/2022 08:54        Scheduled Meds:   stroke: early stages of recovery book   Does not apply Once   aspirin EC  81 mg Oral Daily   atorvastatin  80 mg Oral q1800   clopidogrel  75 mg Oral Daily   enoxaparin (LOVENOX) injection  40 mg Subcutaneous QHS   feeding supplement  237 mL Oral TID BM   finasteride  5 mg Oral Daily   metoprolol tartrate  25 mg Oral BID   tamsulosin  0.4 mg Oral Daily   Continuous Infusions:  cefTRIAXone (ROCEPHIN)  IV 2 g (10/04/22 1106)   lactated ringers       LOS: 1 day      Tresa Moore, MD [facility]   If 7PM-7AM, please contact night-coverage  10/04/2022, 11:09 AM

## 2022-10-04 NOTE — Progress Notes (Signed)
   10/04/22 1000  Spiritual Encounters  Type of Visit Initial  Care provided to: Pt and family  Referral source Chaplain assessment  Reason for visit Routine spiritual support  OnCall Visit No  Spiritual Framework  Patient Stress Factors Not reviewed  Family Stress Factors Not reviewed  Interventions  Spiritual Care Interventions Made Established relationship of care and support  Intervention Outcomes  Outcomes Connection to spiritual care  Spiritual Care Plan  Spiritual Care Issues Still Outstanding No further spiritual care needs at this time (see row info)   Spoke briefly to PT and family member letting them know that [employer] is here for them. They did not need any spiritual care at that time.

## 2022-10-04 NOTE — Progress Notes (Signed)
Physical Therapy Treatment Patient Details Name: Kevin Bradshaw MRN: [id number] DOB: [birth year] Today's Date: 10/04/2022   History of Present Illness 87 y.o. male presented to the ED brought in by family for acute L parietal CVA with symptoms of R sided facial droop, slurred speech and confusion. PMH: chronic A-fib, frequent falls, HTN, HLD, Parkinson's disease with chronic ambulation impairment, multiple CVAs since 2019    PT Comments  Pt received up in chair, agreed to PT. Pt states he relies on a w/c for mobility due to hx of multiple falls. He is able to stand from recliner with Mod/MinA for weight shifting and proper technique. Once standing at [facility], pt requires assist to prevent posterior LOB. Gait training forward 33ft x 2 with RW and Mod/MinA to maintain balance due to unsteady shuffle and posterior retropulsion. Slightly fatigued post session after finishing with seated LE exercises requiring multimodal cues to complete, unable to multi step tasks. Overall very pleasant and cooperative. Pt will benefit from continued PT services post d/c.   If plan is discharge home, recommend the following: A lot of help with walking and/or transfers;A lot of help with bathing/dressing/bathroom   Can travel by private vehicle     No  Equipment Recommendations  Other (comment) (TBD at next level of care)    Recommendations for Other Services       Precautions / Restrictions Precautions Precautions: Fall Restrictions Weight Bearing Restrictions: No     Mobility  Bed Mobility               General bed mobility comments: Pt received in recliner    Transfers Overall transfer level: Needs assistance Equipment used: Rolling walker (2 wheels) Transfers: Sit to/from Stand Sit to Stand: Min assist, Mod assist           General transfer comment: Pt assisted to standing at RW several times with ModA primarily for proper technique and forward weight shift     Ambulation/Gait Ambulation/Gait assistance: Min assist, Mod assist Gait Distance (Feet): 6 Feet Assistive device: Rolling walker (2 wheels) Gait Pattern/deviations: Step-to pattern, Shuffle, Leaning posteriorly, Narrow base of support Gait velocity: decreased     General Gait Details: ModA to prevent posterior lean/LOB, very unsteady, Parkinsonian gait noted. 12ft x 2 with rest break   Psychologist, counselling mobility: Yes (Pt uses w/c for baseline mobility due to hx of multiple falls)   Tilt Bed    Modified Rankin (Stroke Patients Only)       Balance Overall balance assessment: Needs assistance, History of Falls Sitting-balance support: Feet supported Sitting balance-Leahy Scale: Good     Standing balance support: Bilateral upper extremity supported, During functional activity, Reliant on assistive device for balance Standing balance-Leahy Scale: Poor Standing balance comment: high fall risk, poor safety awareness                            Cognition Arousal: Alert Behavior During Therapy: WFL for tasks assessed/performed Overall Cognitive Status: History of cognitive impairments - at baseline                                 General Comments: pleasant and easily awakens, however is poor historian. Is alert to self and daughter in room        Exercises General  Exercises - Lower Extremity Ankle Circles/Pumps: AROM, Both, 15 reps, Seated Long Arc Quad: AROM, Both, 10 reps, Seated Hip ABduction/ADduction: AROM, Both, 10 reps, Seated Hip Flexion/Marching: AROM, Both, 10 reps, Seated Other Exercises Other Exercises: Pt had difficulty following demonstrated exercises often not mirroring back what therapist was requesting.    General Comments General comments (skin integrity, edema, etc.):  (Pt educated on role of PT and current acute goals)      Pertinent Vitals/Pain      Home  Living Family/patient expects to be discharged to:: Assisted living (past 2 months, lived with daughter for 9 years prior to ALF) Living Arrangements: Children (ALF staff) Available Help at Discharge: Family;Other (Comment) (nursing staff.) Type of Home: Other(Comment) (ALF)         Home Layout: One level Home Equipment: Wheelchair - manual;Shower seat - built in;Grab bars - tub/shower;Hand held shower head;Grab bars - toilet      Prior Function            PT Goals (current goals can now be found in the care plan section) Acute Rehab PT Goals Patient Stated Goal: to go home Progress towards PT goals: Progressing toward goals    Frequency    Min 1X/week      PT Plan      Co-evaluation              AM-PAC PT "6 Clicks" Mobility   Outcome Measure  Help needed turning from your back to your side while in a flat bed without using bedrails?: A Little Help needed moving from lying on your back to sitting on the side of a flat bed without using bedrails?: A Lot Help needed moving to and from a bed to a chair (including a wheelchair)?: A Lot Help needed standing up from a chair using your arms (e.g., wheelchair or bedside chair)?: A Lot Help needed to walk in hospital room?: Total Help needed climbing 3-5 steps with a railing? : Total 6 Click Score: 11    End of Session Equipment Utilized During Treatment: Gait belt Activity Tolerance: Patient tolerated treatment well Patient left: in chair;with call bell/phone within reach;with chair alarm set Nurse Communication: Mobility status PT Visit Diagnosis: Unsteadiness on feet (R26.81);Repeated falls (R29.6);Muscle weakness (generalized) (M62.81);History of falling (Z91.81);Difficulty in walking, not elsewhere classified (R26.2)     Time: 1610-9604 PT Time Calculation (min) (ACUTE ONLY): 31 min  Charges:    $Gait Training: 8-22 mins $Therapeutic Exercise: 8-22 mins PT General Charges $$ ACUTE PT VISIT: 1 Visit                     Zadie Cleverly, PTA  Jannet Askew 10/04/2022, 12:53 PM

## 2022-10-04 NOTE — Evaluation (Signed)
Speech Language Pathology Evaluation Patient Details Name: Kevin Bradshaw MRN: [id number] DOB: [birth year] Today's Date: 10/04/2022 Time: 4098-1191 SLP Time Calculation (min) (ACUTE ONLY): 24 min  Problem List:  Patient Active Problem List   Diagnosis Date Noted   Dysarthria 03/25/2022   Persistent atrial fibrillation (HCC) 03/25/2022   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) 03/25/2022   Personal history of fall 03/25/2022   CVA (cerebral vascular accident) (HCC) 03/24/2022   UTI (urinary tract infection) 10/02/2018   Atrial fibrillation with RVR (HCC) 10/02/2018   Sepsis (HCC) 02/18/2018   Acute CVA (cerebrovascular accident) (HCC) 08/22/2017   Right leg weakness 08/21/2017   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   CAP (community acquired pneumonia) 01/06/2016   Productive cough 11/09/2010   Hypertension 11/04/2010   Hyperlipidemia 11/04/2010   Past Medical History:  Past Medical History:  Diagnosis Date   Diastolic dysfunction    a. 09/2018 Echo: EF 55-60%, mild LVH. Diast dysfxn. Nl RV fxn. Nl LA size.   Falls    Hyperlipidemia    Hypertension    PAF (paroxysmal atrial fibrillation) (HCC)    a. 09/2018 in the setting of urosepsis; b. CHA2DS2VASc = 5-->No OAC due to high risk for falls (on ASA/Plavix).   Parkinson disease    Stroke (HCC) 08/2017   Urosepsis (HCC) 09/2018   Past Surgical History:  Past Surgical History:  Procedure Laterality Date   CARDIOVASCULAR STRESS TEST     normal   CARPAL TUNNEL RELEASE  2009   right hand   X-STOP IMPLANTATION     X-STOP IMPLANTATION     Dr. Gerrit Heck   HPI:  87yo male admitted 10/03/22 with confusion. Right facial droop and dysarthria noted 3 days prior to admit, but pt did not seek medical advice. PMH: chronic AFib, frequent falls, HTN, HLD, Parkinson's, multiple CVA's (since 2019). MRI = left parietal infarct late subacute to chronic. Chronic right frontal and parieto-occipital infarcts. Brain atrophy and chronic  right cerebral infarcts.   Assessment / Plan / Recommendation Clinical Impression  Pt seen at bedside for cognitive lingustic evaluation. No family present to discuss baseline level of function. Pt was seated upright in recliner awake and alert, pleasant and cooperative.   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 He reports he lives in a house, but stays with his daughter some. Pt indicates he does not manage his finances or self-administer medications, but he was unable to tell me who manages his finances and medications. He reports not driving for the past 5 years or so. Pt was accurate for his DOB, but was unable to tell me his age. He answered "yes" when asked if he was in his 4's. Pt presents with mild right facial weakness. He exhibited difficulty with following verbal commands for oral motor assessment. Visual cues were not entirely beneficial.   The Mini-Mental State Examination (MMSE) was administered today. Pt scored 12/30, with difficulty noted on orientation, attention/calculatoin, delayed recall (1/3 words recalled), phrase repetition, following complex directions, and reading/writing/copying. This score raises concern for significant neuro-cognitive deficits.   Given current MRI results, deficits observed today are likely chronic in nature, rather than related to a new neurological event. Based on pt performance, close 24 hour supervision is highly recommended to maximize safety. No further acute ST needs, however, pt/family may benefit from education at next level of care (currently ALF - recommend consideration of SNF) regarding routines and environmental cues  to [facility] safety and decrease caregiver burden.    SLP Assessment  SLP Recommendation/Assessment: All further Speech Language Pathology needs can be addressed in the next venue of care if needs arise.  SLP Visit Diagnosis: Cognitive communication deficit (R41.841)     Recommendations for follow up therapy are one component of a multi-disciplinary discharge planning process, led by the attending physician.  Recommendations may be updated based on patient status, additional functional criteria and insurance authorization.    Follow Up Recommendations  Follow physician's recommendations for discharge plan and follow up therapies    Assistance Recommended at Discharge  Frequent or constant Supervision/Assistance  Functional Status Assessment Patient has not had a recent decline in their functional status        SLP Evaluation Cognition  Overall Cognitive Status: No family/caregiver present to determine baseline cognitive functioning Arousal/Alertness: Awake/alert Orientation Level: Oriented to person;Oriented to place;Disoriented to time;Disoriented to situation       Comprehension  Auditory Comprehension Overall Auditory Comprehension: Impaired at baseline Interfering Components: Attention;Working memory;Processing speed EffectiveTechniques: Repetition;Increased volume;Extra processing time;Visual/Gestural cues;Slowed speech Reading Comprehension Reading Status: Impaired Sentence Level: Impaired (unable to follow written direction to "close your eyes") Interfering Components: Attention;Working memory;Processing time    Expression Expression Primary Mode of Expression: Verbal Verbal Expression Overall Verbal Expression: Appears within functional limits for tasks assessed Written Expression Dominant Hand: Right Written Expression: Exceptions to [facility] (unable to demonstrate)   Oral / Motor  Oral Motor/Sensory Function Overall Oral Motor/Sensory Function: Mild impairment Facial ROM: Reduced right Facial Symmetry: Abnormal symmetry right Facial Strength: Reduced right Lingual ROM: Within Functional Limits Lingual Symmetry: Within Functional Limits Lingual Strength: Reduced Mandible: Within Functional Limits Motor Speech Overall Motor Speech:  Impaired Respiration: Within functional limits Phonation: Normal Resonance: Within functional limits Articulation: Impaired Level of Impairment: Phrase Intelligibility: Intelligibility reduced Word: 75-100% accurate Phrase: 75-100% accurate Sentence: 50-74% accurate Motor Planning: Witnin functional limits Motor Speech Errors: Not applicable           Keneth Borg B. Murvin Natal, [facility], CCC-SLP Speech Language Pathologist  [facility] 10/04/2022, 12:28 PM

## 2022-10-04 NOTE — Evaluation (Addendum)
Occupational Therapy Evaluation Patient Details Name: Kevin Bradshaw MRN: [id number] DOB: 01/21/1935 Today's Date: 10/04/2022   History of Present Illness 87 y.o. 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 MRI negative for CVA, likely altered mentation from UTI. PMH: chronic A-fib, frequent falls, HTN, HLD, Parkinson's disease with chronic ambulation impairment, multiple CVAs since 2019   Clinical Impression   Pt was seen for OT evaluation this date. Prior to hospital admission, pt was a resident at [facility] for the last 2 months. Prior to this he had lived with his daughter for the last 9 years. He was W/C bound with ALF staff providing 24/7 supervision for ADL/mobility. Per daughter, he was able to feed and dress himself, but needed assistance with bathing from ALF staff. Daughter reports him having mult falls d/t trying to get up on his own ~2-3x/wk. She reports he was able to perform SPT from bed<>W/C<>toilet on his own most of the time. Pt presents to acute OT demonstrating impaired ADL performance and functional mobility 2/2 acute L parietal CVA. (See OT problem list for additional functional deficits). Pt denies pain today. He is oriented to self and daughter, unable to recall month or year-per daughter this is baseline. He is currently requiring Max A to don socks d/t weak core/limited dyanmic seated balance requiring BUE support on bed for static seated balance. Pt provided set up assist and able to perform face washing task, daughter fed him in the ER yesterday reports it was due to height of table, etc. he performed supine<>Sit at EOB with Mod A, verb cues and extra time using bed rail with assist needed via cueing and hands on for BLE management and trunk. Pt able to perform STS from EOB to RW with MIN/CGA and SPT from bed>recliner with Min A for walker management and heavy  verb cues for sequencing. Pt would benefit from skilled OT services to address noted impairments and functional limitations (see below for any additional details) in order to maximize safety and independence while minimizing falls risk and caregiver burden. Anticipate the need for follow up OT services upon acute hospital DC.       If plan is discharge home, recommend the following: A lot of help with bathing/dressing/bathroom;Direct supervision/assist for medications management;A little help with walking and/or transfers;Supervision due to cognitive status    Functional Status Assessment  Patient has had a recent decline in their functional status and demonstrates the ability to make significant improvements in function in a reasonable and predictable amount of time.  Equipment Recommendations  None recommended by OT    Recommendations for Other Services       Precautions / Restrictions Restrictions Weight Bearing Restrictions: No      Mobility Bed Mobility Overal bed mobility: Needs Assistance Bed Mobility: Supine to Sit     Supine to sit: Mod assist     General bed mobility comments: he performed supine<>Sit at EOB with Mod A, verb cues and extra time using bed rail with assist needed via cueing and hands on for BLE management and trunk    Transfers Overall transfer level: Needs assistance Equipment used: Rolling walker (2 wheels) Transfers: Bed to chair/wheelchair/BSC, Sit to/from Stand Sit to Stand: Min assist, Contact guard assist Stand pivot transfers: Min assist, Contact guard assist         General transfer comment: Pt able to perform STS  from EOB to RW with MIN/CGA and SPT from bed>recliner with Min A for walker management and heavy verb cues for sequencing.      Balance Overall balance assessment: Needs assistance, History of Falls Sitting-balance support: Feet supported Sitting balance-Leahy Scale: Good     Standing balance support: Bilateral upper  extremity supported Standing balance-Leahy Scale: Poor                             ADL either performed or assessed with clinical judgement   ADL Overall ADL's : Needs assistance/impaired     Grooming: Wash/dry face Grooming Details (indicate cue type and reason): pt provided set up assist and able to perform face washing task, daughter fed him in the ER yesterday reports it was due to height of table, etc.             Lower Body Dressing: Maximal assistance Lower Body Dressing Details (indicate cue type and reason): Max A to don socks d/t weak core/limited dyanmic seated balance requiring BUE support on bed for static seated balance               General ADL Comments: Max A for LB ADLs, S/U assist for grooming tasks; overall weakness and limited balance and safety     Vision         Perception         Praxis         Pertinent Vitals/Pain Pain Assessment Pain Assessment: No/denies pain     Extremity/Trunk Assessment Upper Extremity Assessment Upper Extremity Assessment: Generalized weakness           Communication     Cognition Arousal: Alert (asleep on entry to room, but with increased time able to arouse) Behavior During Therapy: WFL for tasks assessed/performed Overall Cognitive Status: History of cognitive impairments - at baseline (parkinson's dementia at baseline, always confused, oriented to person only)                                 General Comments: pleasant and easily awakens, however is poor historian. Is alert to self and daughter in room     General Comments       Exercises     Shoulder Instructions      Home Living Family/patient expects to be discharged to:: Assisted living (past 2 months, lived with daughter for 9 years prior to ALF) Living Arrangements: Children (ALF staff) Available Help at Discharge: Family;Available PRN/intermittently;Available 24 hours/day (ALF staff) Type of Home:  (ALF)        Home Layout: One level     Bathroom Shower/Tub: Walk-in shower         Home Equipment: Wheelchair - manual;Shower seat - built in;Grab bars - tub/shower;Hand held shower head;Grab bars - toilet          Prior Functioning/Environment Prior Level of Function : Needs assist;History of Falls (last six months)  Cognitive Assist : ADLs (cognitive);Mobility (cognitive) Mobility (Cognitive): Intermittent cues ADLs (Cognitive): Intermittent cues       Mobility Comments: per daughter in room, pt was W/C bound with mult falls d/t trying to get up on his own. She reports he was able to perform SPT from bed<>W/C<>toilet on his own most of the time ADLs Comments: ALF staff provides 24/7 supervision for ADL/mobility, he was able to feed and dress himself but needed assistance with  bathing from ALF staff        OT Problem List: Decreased strength;Decreased cognition;Decreased safety awareness;Impaired balance (sitting and/or standing);Decreased knowledge of use of DME or AE;Impaired UE functional use      OT Treatment/Interventions: Self-care/ADL training;Therapeutic exercise;Therapeutic activities;Neuromuscular education;Energy conservation;DME and/or AE instruction;Patient/family education;Balance training    OT Goals(Current goals can be found in the care plan section) Acute Rehab OT Goals Patient Stated Goal: return to ALF OT Goal Formulation: With patient/family Time For Goal Achievement: 10/18/22 Potential to Achieve Goals: Fair  OT Frequency: Min 1X/week    Co-evaluation              AM-PAC OT "6 Clicks" Daily Activity     Outcome Measure Help from another person eating meals?: A Little Help from another person taking care of personal grooming?: A Little Help from another person toileting, which includes using toliet, bedpan, or urinal?: A Lot Help from another person bathing (including washing, rinsing, drying)?: A Lot Help from another person to put on and taking off  regular upper body clothing?: A Little Help from another person to put on and taking off regular lower body clothing?: A Lot 6 Click Score: 15   End of Session Equipment Utilized During Treatment: Rolling walker (2 wheels) Nurse Communication: Mobility status  Activity Tolerance: Patient tolerated treatment well Patient left: in chair;with call bell/phone within reach;with chair alarm set;with family/visitor present  OT Visit Diagnosis: Unsteadiness on feet (R26.81);Repeated falls (R29.6);Muscle weakness (generalized) (M62.81);History of falling (Z91.81)                Time: 6063-0160 OT Time Calculation (min): 33 min Charges:  OT General Charges $OT Visit: 1 Visit OT Evaluation $OT Eval Moderate Complexity: 1 Mod OT Treatments $Self Care/Home Management : 8-22 mins   Jacoya Bauman, OTR/L 10/04/2022, 9:40 AM

## 2022-10-05 DIAGNOSIS — I639 Cerebral infarction, unspecified: Secondary | ICD-10-CM | POA: Diagnosis not present

## 2022-10-05 NOTE — Progress Notes (Addendum)
Occupational Therapy Treatment Patient Details Name: Kevin Bradshaw MRN: [id number] DOB: 1935-07-12 Today's Date: 10/05/2022   History of present illness 87 y.o. male presented to the ED brought in by family for suspect subacute L parietal CVA with symptoms of R sided facial droop, slurred speech and confusion. MRI negative for MRI, believed to be altered mentation from UTI. PMH: chronic A-fib, frequent falls, HTN, HLD, Parkinson's disease with chronic ambulation impairment, multiple CVAs since 2019   OT comments  Pt is supine in bed on entry to room following bladder scan. He denies pain. He needed Min/Mod A for supine to sit at EOB for BLE management and to reach upright seated position with trunk. Noted intially to lean to the R side when attempting to perform LB dressing task of donning socks, after mult trials he needed Max A to don socks. Pt required MIN A for oral care, set up of toothbrush with toothpaste on it, CGA for safety/balance at sink with unilateral support of one UE on sink/countertop. Pt performed STS from EOB x2 during session with Min/MOD A for safety, proper technique/hand and foot placement. Returned to supine position requiring Max/Mod A for BLE management and repositioning to center and HOB. Bed noted to be soiled, therefore assisted with changing linens and pt gown. Notified nurse tech of need for male pure wic to be replaced. He needs increased time and one step commands d/t cognitive limitations-had at baseline per daughter. Pt continues to be limited by weakness, balance and safety deficits requiring increased assistance with all mobility and ADLs and will benefit from continued acute OT services. Now recommending SNF based on higher level of care needed.       If plan is discharge home, recommend the following:  A lot of help with bathing/dressing/bathroom;Direct supervision/assist for medications management;A little help with walking and/or transfers;Supervision due to  cognitive status   Equipment Recommendations  Other (comment) (pt had all needed equipment at ALF, will defer to SNF)    Recommendations for Other Services      Precautions / Restrictions Restrictions Weight Bearing Restrictions: No       Mobility Bed Mobility Overal bed mobility: Needs Assistance Bed Mobility: Supine to Sit     Supine to sit: Mod assist, HOB elevated, Used rails     General bed mobility comments: supine in bed on entry, needed Mod A for supine to sit at EOB for BLE management and to reach upright seated position with trunk    Transfers Overall transfer level: Needs assistance Equipment used: Rolling walker (2 wheels) Transfers: Sit to/from Stand Sit to Stand: Min assist, Mod assist           General transfer comment: Pt performed STS from EOB x2 during session with Min/MOD A for safety, proper technique/hand and foot placement     Balance Overall balance assessment: Needs assistance, History of Falls Sitting-balance support: Feet supported, Single extremity supported Sitting balance-Leahy Scale: Good Sitting balance - Comments: noted with R lateral lean initally, improved once repositioned with BLE on floor and able to sit with CGA/SBA Postural control: Right lateral lean Standing balance support: Bilateral upper extremity supported, During functional activity, Reliant on assistive device for balance Standing balance-Leahy Scale: Poor Standing balance comment: high fall risk, poor safety awareness                           ADL either performed or assessed with clinical judgement   ADL  Overall ADL's : Needs assistance/impaired     Grooming: Oral care;Minimal assistance Grooming Details (indicate cue type and reason): pt required MIN A for oral care, set up of toothbrush with toothpaste on it, CGA for safety/balance at sink with unilateral support of one UE on sink/countertop             Lower Body Dressing: Maximal  assistance Lower Body Dressing Details (indicate cue type and reason): Max A to don socks d/t weak core/limited dyanmic seated balance requiring BUE support on bed for static seated balance                    Extremity/Trunk Assessment Upper Extremity Assessment Upper Extremity Assessment: Generalized weakness            Vision       Perception     Praxis      Cognition Arousal: Alert Behavior During Therapy: WFL for tasks assessed/performed Overall Cognitive Status: History of cognitive impairments - at baseline                                 General Comments: pleasant and easily awakens, however is poor historian. Is alert to self and daughter in room        Exercises      Shoulder Instructions       General Comments      Pertinent Vitals/ Pain       Pain Assessment Pain Assessment: No/denies pain  Home Living                                          Prior Functioning/Environment              Frequency  Min 1X/week        Progress Toward Goals  OT Goals(current goals can now be found in the care plan section)  Progress towards OT goals: Progressing toward goals  Acute Rehab OT Goals Patient Stated Goal: improve safety/balance to return to ALF OT Goal Formulation: With patient Time For Goal Achievement: 10/18/22 Potential to Achieve Goals: Fair  Plan      Co-evaluation                 AM-PAC OT "6 Clicks" Daily Activity     Outcome Measure   Help from another person eating meals?: A Little Help from another person taking care of personal grooming?: A Little Help from another person toileting, which includes using toliet, bedpan, or urinal?: A Lot Help from another person bathing (including washing, rinsing, drying)?: A Lot Help from another person to put on and taking off regular upper body clothing?: A Little Help from another person to put on and taking off regular lower body clothing?:  A Lot 6 Click Score: 15    End of Session Equipment Utilized During Treatment: Rolling walker (2 wheels)  OT Visit Diagnosis: Unsteadiness on feet (R26.81);Repeated falls (R29.6);Muscle weakness (generalized) (M62.81);History of falling (Z91.81)   Activity Tolerance Patient tolerated treatment well   Patient Left in bed;with call bell/phone within reach;with bed alarm set   Nurse Communication Mobility status;Other (comment) (need for male purewic)        Time: 1031-1105 OT Time Calculation (min): 34 min  Charges: OT General Charges $OT Visit: 1 Visit OT Treatments $Self Care/Home Management :  23-37 mins  [employer], OTR/L 10/05/2022, 11:23 AM

## 2022-10-05 NOTE — TOC Progression Note (Signed)
Transition of Care [facility]) - Progression Note    Patient Details  Name: Golden Mulderig MRN: [id number] Date of Birth: Jun 20, 1935  [facility] of [facility]) CM/SW Contact  Allena Katz, LCSW Phone Number: 10/05/2022, 9:39 AM  Clinical Narrative:   Referrals sent out for rehab. Per RN, daughter would like referrals sent out. [facility]. Referrals sent.    Expected Discharge Plan: Assisted Living Barriers to Discharge: Continued Medical Work up  Expected Discharge Plan and Services     Post Acute Care Choice: Durable Medical Equipment Living arrangements for the past 2 months: Assisted Living Facility                                       Social Determinants of Health (SDOH) Interventions SDOH Screenings   Tobacco Use: Medium Risk (10/03/2022)    Readmission Risk Interventions     No data to display

## 2022-10-05 NOTE — Progress Notes (Signed)
PT Cancellation Note  Patient Details Name: Kevin Bradshaw MRN: [id number] DOB: [birth year]   Cancelled Treatment:     Pt very anxious this am, continuously attempting to get OOB and apparently exhausted upon therapist's arrival and very lethargic. Nursing suggested to let pt rest at this time. Will re-attempt next available date/time per POC   Jannet Askew 10/05/2022, 12:44 PM

## 2022-10-05 NOTE — Progress Notes (Signed)
PROGRESS NOTE    Kevin Bradshaw  UXL:[id number] DOB: [birth year] DOA: 10/03/2022 PCP: Gracelyn Nurse, MD    Brief Narrative:  87 y.o. male with medical history significant of chronic A-fib not on anticoagulation due to frequent falls, HTN, HLD, Parkinson's disease with chronic ambulation impairment and frequent falls, multiple CVAs since 2019 on aspirin and Plavix, brought in by family member for evaluation of confusion.   3 days ago family started noticed that the patient developed right-sided facial droop and slurred speech but did not seek any medical advice.  And this morning, family visited patient again and found the patient very confused and decided to bring him in.  Patient is confused but denied any pain and the following simple commands.  9/23: MRI reviewed with patient and family member at bedside.  No evidence of CVA.  Suspected altered mentation secondary to UTI.  9/24: Mental status improved   Assessment & Plan:   Principal Problem:   CVA (cerebral vascular accident) (HCC) Active Problems:   Atrial fibrillation with RVR (HCC)   Acute CVA (cerebrovascular accident) (HCC)   UTI (urinary tract infection)  UTI Acute metabolic encephalopathy secondary to above CVA, ruled out Patient presented with right-sided facial droop and dysarthria.  I suspect this is representative of worsening of his underlying neurologic deficits in the setting of acute UTI.  Mental status improving.  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  Patient on DAPT aspirin and Plavix.  Will continue.  Outpatient follow-up with neurology.  No evidence of acute CVA.  PT OT evaluations.  Recommend skilled nursing facility.  Authorization initiated  Chronic atrial fibrillation Not  on anticoagulation presumably secondary to falls.  Is on aspirin and Plavix.  Will continue.  Essential hypertension Metoprolol resumed.  Hold remainder blood pressure medications for now.  Restart as appropriate.  BPH Flomax and Proscar    DVT prophylaxis: SQ Lovenox Code Status: DNR Family Communication: Daughter at bedside 9/23 Disposition Plan: Status is: Inpatient Remains inpatient appropriate because: UTI and associated metabolic encephalopathy   Level of care: Telemetry Medical  Consultants:  None  Procedures:  None  Antimicrobials: Ceftriaxone   Subjective: Seen and examined.  Lying in bed.  No visible distress.  No family bedside this morning.  Objective: Vitals:   10/04/22 1936 10/04/22 2336 10/05/22 0336 10/05/22 0738  BP: (!) 109/96 (!) 145/95 (!) 149/84 (!) 117/92  Pulse: 79 77 90 91  Resp: 16 16 16 17   Temp: 98.9 F (37.2 C) 97.9 F (36.6 C) 98 F (36.7 C) 97.9 F (36.6 C)  TempSrc:  Oral Oral Oral  SpO2: 100% 95% 93% 95%  Weight:      Height:        Intake/Output Summary (Last 24 hours) at 10/05/2022 1103 Last data filed at 10/04/2022 2028 Gross per 24 hour  Intake 240 ml  Output --  Net 240 ml   Filed Weights   10/03/22 0731 10/03/22 1750  Weight: 72.6 kg 63.4 kg    Examination:  General exam: No acute distress Respiratory system: Clear to auscultation. Respiratory effort normal. Cardiovascular system: S1-S2, regular rate, irregular rhythm, no murmurs, no pedal edema Gastrointestinal system: Soft, NT/ND, normal bowel sounds Central nervous system: Alert.  Oriented x 2.  No focal deficits Extremities: Symmetric 5 x 5 power.  Gait not assessed Skin: No rashes, lesions or ulcers Psychiatry: Judgement and insight appear normal. Mood & affect appropriate.     Data Reviewed: I have personally reviewed following labs and imaging studies  CBC: Recent Labs  Lab 10/03/22 0750  WBC 5.1  NEUTROABS 3.3  HGB 14.7  HCT 45.6  MCV 95.4   PLT 207   Basic Metabolic Panel: Recent Labs  Lab 10/03/22 0750  NA 140  K 4.2  CL 106  CO2 27  GLUCOSE 98  BUN 18  CREATININE 1.01  CALCIUM 9.3   GFR: Estimated Creatinine Clearance: 43 mL/min (by C-G formula based on SCr of 1.01 mg/dL). Liver Function Tests: Recent Labs  Lab 10/03/22 0750  AST 30  ALT 20  ALKPHOS 77  BILITOT 1.3*  PROT 7.5  ALBUMIN 3.7   No results for input(s): "LIPASE", "AMYLASE" in the last 168 hours. No results for input(s): "AMMONIA" in the last 168 hours. Coagulation Profile: No results for input(s): "INR", "PROTIME" in the last 168 hours. Cardiac Enzymes: No results for input(s): "CKTOTAL", "CKMB", "CKMBINDEX", "TROPONINI" in the last 168 hours. BNP (last 3 results) No results for input(s): "PROBNP" in the last 8760 hours. HbA1C: Recent Labs    10/03/22 0750  HGBA1C 6.0*   CBG: No results for input(s): "GLUCAP" in the last 168 hours. Lipid Profile: Recent Labs    10/04/22 0426  CHOL 124  HDL 39*  LDLCALC 69  TRIG 79  CHOLHDL 3.2   Thyroid Function Tests: No results for input(s): "TSH", "T4TOTAL", "FREET4", "T3FREE", "THYROIDAB" in the last 72 hours. Anemia Panel: No results for input(s): "VITAMINB12", "FOLATE", "FERRITIN", "TIBC", "IRON", "RETICCTPCT" in the last 72 hours. Sepsis Labs: No results for input(s): "PROCALCITON", "LATICACIDVEN" in the last 168 hours.  Recent Results (from the past 240 hour(s))  Urine Culture (for pregnant, neutropenic or urologic patients or patients with an indwelling urinary catheter)     Status: Abnormal (Preliminary result)   Collection Time: 10/04/22 11:00 AM   Specimen: Urine, Clean Catch  Result Value Ref Range Status   Specimen Description   Final    URINE, CLEAN CATCH Performed at [facility], [address] [place], [place] [address]    Special Requests   Final    NONE Performed at [facility], [address], [place], [place] [address]    Culture  >=100,000 COLONIES/mL GRAM NEGATIVE RODS (A)  Final   Report Status PENDING  Incomplete         Radiology Studies: MR BRAIN WO CONTRAST  Result Date: 10/03/2022 CLINICAL DATA:  Stroke follow-up EXAM: MRI HEAD WITHOUT CONTRAST TECHNIQUE: Multiplanar, multiecho pulse sequences of the brain and surrounding structures were obtained without intravenous contrast. COMPARISON:  Head CT from earlier today FINDINGS: Brain: The interval left parietal infarct highlighted on prior head CT has features of late subacute to chronic timing by diffusion imaging with neutral to loss of volume and cortical laminar necrosis. Chronic right frontal and parietooccipital infarcts. Brain atrophy with ventriculomegaly. Chronic blood products at sites of prior infarction. No mass, obstructive hydrocephalus, or collection Vascular: Major flow voids are preserved Skull and upper cervical spine: No focal marrow lesion. Advanced cervical spine degeneration with erosive changes of the dens where there is calcification, CPPD arthropathy could have this pattern. Nodular scarring in the suboccipital scalp. Sinuses/Orbits: Mild ethmoid sinus opacification. IMPRESSION: 1. The left parietal infarct highlighted on prior head CT is late subacute to chronic. 2. Brain atrophy and chronic right cerebral infarcts.  Electronically Signed   By: Tiburcio Pea M.D.   On: 10/03/2022 12:16        Scheduled Meds:  aspirin EC  81 mg Oral Daily   atorvastatin  80 mg Oral q1800   clopidogrel  75 mg Oral Daily   enoxaparin (LOVENOX) injection  40 mg Subcutaneous QHS   feeding supplement  237 mL Oral TID BM   finasteride  5 mg Oral Daily   metoprolol tartrate  25 mg Oral BID   tamsulosin  0.4 mg Oral Daily   Continuous Infusions:  cefTRIAXone (ROCEPHIN)  IV 2 g (10/04/22 1106)   lactated ringers 75 mL/hr at 10/04/22 1546     LOS: 2 days      Tresa Moore, MD [facility]   If 7PM-7AM, please contact  night-coverage  10/05/2022, 11:03 AM

## 2022-10-05 NOTE — [place] FL2 (Signed)
[place] MEDICAID [id number] LEVEL OF CARE FORM     IDENTIFICATION  Patient Name: Kevin Bradshaw Birthdate: [birth year] Sex: male Admission Date (Current Location): 10/03/2022  [facility] and [place] Number:  Chiropodist and Address:  [facility], [address], [place], [place] [address]      Provider Number: [id number]  Attending Physician Name and Address:  Tresa Moore, MD  Relative Name and Phone Number:  [facility]) (Daughter)  [phone]    Current Level of Care: Hospital Recommended Level of Care: Skilled Nursing Facility Prior Approval Number:    Date Approved/Denied:   PASRR Number: [id number]  Discharge Plan: SNF    Current Diagnoses: Patient Active Problem List   Diagnosis Date Noted   Dysarthria 03/25/2022   Persistent atrial fibrillation (HCC) 03/25/2022   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) 03/25/2022   Personal history of fall 03/25/2022   CVA (cerebral vascular accident) (HCC) 03/24/2022   UTI (urinary tract infection) 10/02/2018   Atrial fibrillation with RVR (HCC) 10/02/2018   Sepsis (HCC) 02/18/2018   Acute CVA (cerebrovascular accident) (HCC) 08/22/2017   Right leg weakness 08/21/2017   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   CAP (community acquired pneumonia) 01/06/2016   Productive cough 11/09/2010   Hypertension 11/04/2010   Hyperlipidemia 11/04/2010    Orientation RESPIRATION BLADDER Height & Weight     Place, Self  Normal Incontinent Weight: 139 lb 12.4 oz (63.4 kg) Height:  5\' 3"  (160 cm)  BEHAVIORAL SYMPTOMS/MOOD NEUROLOGICAL BOWEL NUTRITION STATUS      Incontinent    AMBULATORY STATUS COMMUNICATION OF NEEDS Skin   Limited Assist Verbally Normal                       Personal Care Assistance Level of Assistance  Bathing, Feeding, Dressing Bathing Assistance: Limited assistance Feeding assistance: Limited assistance Dressing Assistance: Limited assistance      Functional Limitations Info  Sight, Hearing, Speech Sight Info: Impaired Hearing Info: Impaired Speech Info: Adequate    SPECIAL CARE FACTORS FREQUENCY  PT (By licensed PT), OT (By licensed OT)     PT Frequency: 5 times a week OT Frequency: 5 times a week            Contractures Contractures Info: Not present    Additional Factors Info  Code Status, Isolation Precautions Code Status Info: DNR-limited       Isolation Precautions Info: MRSA     Current Medications (10/05/2022):  This is the current hospital active medication list Current Facility-Administered Medications  Medication Dose Route Frequency Provider Last Rate Last Admin   acetaminophen (TYLENOL) tablet 650 mg  650 mg Oral Q4H PRN Mikey College T, MD       Or   acetaminophen (TYLENOL) 160 MG/5ML solution 650 mg  650 mg Per Tube Q4H PRN Mikey College T, MD       Or   acetaminophen (TYLENOL) suppository 650 mg  650 mg Rectal Q4H PRN Emeline General, MD       aspirin EC tablet 81 mg  81 mg Oral Daily Mikey College T, MD   81 mg at 10/04/22 1106   atorvastatin (LIPITOR) tablet 80 mg  80 mg Oral q1800 Mikey College T, MD   80 mg at 10/04/22 1810   cefTRIAXone (ROCEPHIN) 2 g in sodium chloride 0.9 % 100 mL IVPB  2 g Intravenous Q24H Trinna Post, MD 200 mL/hr at 10/04/22 1106 2 g at 10/04/22  1106   clopidogrel (PLAVIX) tablet 75 mg  75 mg Oral Daily Mikey College T, MD   75 mg at 10/04/22 1106   enoxaparin (LOVENOX) injection 40 mg  40 mg Subcutaneous QHS Mikey College T, MD   40 mg at 10/04/22 2028   feeding supplement (ENSURE ENLIVE / ENSURE PLUS) liquid 237 mL  237 mL Oral TID BM Mikey College T, MD   237 mL at 10/04/22 2028   finasteride (PROSCAR) tablet 5 mg  5 mg Oral Daily Mikey College T, MD   5 mg at 10/04/22 1106   haloperidol (HALDOL) tablet 2 mg  2 mg Oral Q6H PRN Lolita Patella B, MD   2 mg at 10/04/22 1532   Or   haloperidol lactate (HALDOL) injection 2 mg  2 mg Intramuscular Q6H PRN Lolita Patella B, MD   2 mg at  10/05/22 6295   lactated ringers infusion   Intravenous Continuous Lolita Patella B, MD 75 mL/hr at 10/04/22 1546 New Bag at 10/04/22 1546   LORazepam (ATIVAN) injection 0.5 mg  0.5 mg Intravenous Q4H PRN Mikey College T, MD       metoprolol tartrate (LOPRESSOR) tablet 25 mg  25 mg Oral BID Mikey College T, MD   25 mg at 10/04/22 2028   senna-docusate (Senokot-S) tablet 1 tablet  1 tablet Oral QHS PRN Mikey College T, MD       tamsulosin [facility]) capsule 0.4 mg  0.4 mg Oral Daily Mikey College T, MD   0.4 mg at 10/04/22 1106     Discharge Medications: Please see discharge summary for a list of discharge medications.  Relevant Imaging Results:  Relevant Lab Results:   Additional Information SS: [id number]  Allena Katz, LCSW

## 2022-10-06 DIAGNOSIS — N3001 Acute cystitis with hematuria: Secondary | ICD-10-CM | POA: Diagnosis not present

## 2022-10-06 LAB — URINE CULTURE: Culture: >=100000 — AB

## 2022-10-06 MED ORDER — CEPHALEXIN 500 MG PO CAPS
500.0000 mg | ORAL_CAPSULE | Freq: Three times a day (TID) | ORAL | Status: AC
  Administered 2022-10-07 (×3): 500 mg via ORAL
  Filled 2022-10-06 (×3): qty 1

## 2022-10-06 NOTE — Progress Notes (Addendum)
Progress Note    Kevin Bradshaw  WUJ:[id number] DOB: 09/29/1935  DOA: 10/03/2022 PCP: Gracelyn Nurse, MD      Brief Narrative:    Medical records reviewed and are as summarized below:  Kevin Bradshaw is a 87 y.o. male with medical history significant of chronic A-fib not on anticoagulation due to frequent falls, HTN, HLD, Parkinson's disease with chronic ambulation impairment and frequent falls, multiple CVAs since 2019 on aspirin and Plavix, brought in by family member for evaluation of confusion.        Assessment/Plan:   Principal Problem:   UTI (urinary tract infection) Active Problems:   Atrial fibrillation with RVR (HCC)   Acute metabolic encephalopathy   History of stroke    Body mass index is 24.76 kg/m.   Acute UTI: Urine culture showed Proteus mirabilis.  Change IV ceftriaxone to oral Keflex based on culture sensitivity report.   Acute metabolic encephalopathy: Likely due to acute UTI.  Improved. No acute stroke on MRI brain but subacute to chronic infarct suspected in the left parietal area.   History of stroke, Parkinson's disease, dementia, frequent falls: PT recommended discharge to SNF.  Awaiting placement.   Permanent atrial fibrillation: Not on chronic anticoagulation because of high fall risk.  He is on aspirin and Plavix.   Other comorbidities include hypertension, BPH    Diet Order             Diet Heart Fluid consistency: Thin  Diet effective now                            Consultants: None  Procedures: None    Medications:    aspirin EC  81 mg Oral Daily   atorvastatin  80 mg Oral q1800   [START ON 10/07/2022] cephALEXin  500 mg Oral Q8H   clopidogrel  75 mg Oral Daily   enoxaparin (LOVENOX) injection  40 mg Subcutaneous QHS   feeding supplement  237 mL Oral TID BM   finasteride  5 mg Oral Daily   metoprolol tartrate  25 mg Oral BID   tamsulosin  0.4 mg Oral Daily   Continuous  Infusions:     Anti-infectives (From admission, onward)    Start     Dose/Rate Route Frequency Ordered Stop   10/07/22 0600  cephALEXin (KEFLEX) capsule 500 mg        500 mg Oral Every 8 hours 10/06/22 1616 10/08/22 0559   10/03/22 0915  cefTRIAXone (ROCEPHIN) 2 g in sodium chloride 0.9 % 100 mL IVPB  Status:  Discontinued        2 g 200 mL/hr over 30 Minutes Intravenous Every 24 hours 10/03/22 0913 10/06/22 1616              Family Communication/Anticipated D/C date and plan/Code Status   DVT prophylaxis: enoxaparin (LOVENOX) injection 40 mg Start: 10/03/22 2200     Code Status: Limited: Do not attempt resuscitation (DNR) -DNR-LIMITED -Do Not Intubate/DNI   Family Communication: None Disposition Plan: Plan to discharge to SNF   Status is: Inpatient Remains inpatient appropriate because: Awaiting placement to SNF.  Insurance authorization is pending.       Subjective:   Interval events noted.  He has no complaints.  Megan, OT, was at the bedside  Objective:    Vitals:   10/05/22 1939 10/05/22 2339 10/06/22 0339 10/06/22 0738  BP: (!) 130/97 (!) 152/71 139/84 120/84  Pulse: 81 76 88 77  Resp:    16  Temp: 98.5 F (36.9 C) 98.2 F (36.8 C) (!) 97.5 F (36.4 C) (!) 97.4 F (36.3 C)  TempSrc: Oral Oral Oral   SpO2: 96% 96% 95% 99%  Weight:      Height:       No data found.  No intake or output data in the 24 hours ending 10/06/22 1624  Filed Weights   10/03/22 0731 10/03/22 1750  Weight: 72.6 kg 63.4 kg    Exam:  GEN: NAD SKIN: Warm and dry EYES: No pallor or icterus ENT: MMM CV: RRR PULM: CTA B ABD: soft, ND, NT, +BS CNS: AAO x 2 (oriented to person and place), non focal EXT: No edema or tenderness        Data Reviewed:   I have personally reviewed following labs and imaging studies:  Labs: Labs show the following:   Basic Metabolic Panel: Recent Labs  Lab 10/03/22 0750  NA 140  K 4.2  CL 106  CO2 27  GLUCOSE 98   BUN 18  CREATININE 1.01  CALCIUM 9.3   GFR Estimated Creatinine Clearance: 43 mL/min (by C-G formula based on SCr of 1.01 mg/dL). Liver Function Tests: Recent Labs  Lab 10/03/22 0750  AST 30  ALT 20  ALKPHOS 77  BILITOT 1.3*  PROT 7.5  ALBUMIN 3.7   No results for input(s): "LIPASE", "AMYLASE" in the last 168 hours. No results for input(s): "AMMONIA" in the last 168 hours. Coagulation profile No results for input(s): "INR", "PROTIME" in the last 168 hours.  CBC: Recent Labs  Lab 10/03/22 0750  WBC 5.1  NEUTROABS 3.3  HGB 14.7  HCT 45.6  MCV 95.4  PLT 207   Cardiac Enzymes: No results for input(s): "CKTOTAL", "CKMB", "CKMBINDEX", "TROPONINI" in the last 168 hours. BNP (last 3 results) No results for input(s): "PROBNP" in the last 8760 hours. CBG: No results for input(s): "GLUCAP" in the last 168 hours. D-Dimer: No results for input(s): "DDIMER" in the last 72 hours. Hgb A1c: No results for input(s): "HGBA1C" in the last 72 hours. Lipid Profile: Recent Labs    10/04/22 0426  CHOL 124  HDL 39*  LDLCALC 69  TRIG 79  CHOLHDL 3.2   Thyroid function studies: No results for input(s): "TSH", "T4TOTAL", "T3FREE", "THYROIDAB" in the last 72 hours.  Invalid input(s): "FREET3" Anemia work up: No results for input(s): "VITAMINB12", "FOLATE", "FERRITIN", "TIBC", "IRON", "RETICCTPCT" in the last 72 hours. Sepsis Labs: Recent Labs  Lab 10/03/22 0750  WBC 5.1    Microbiology Recent Results (from the past 240 hour(s))  Urine Culture (for pregnant, neutropenic or urologic patients or patients with an indwelling urinary catheter)     Status: Abnormal   Collection Time: 10/04/22 11:00 AM   Specimen: Urine, Clean Catch  Result Value Ref Range Status   Specimen Description   Final    URINE, CLEAN CATCH Performed at [facility], [address], [place], [place] [address]    Special Requests   Final    NONE Performed at [facility], [address] [place], [place] [address]    Culture >=100,000 COLONIES/mL PROTEUS MIRABILIS (A)  Final   Report Status 10/06/2022 FINAL  Final   Organism ID, Bacteria PROTEUS MIRABILIS (A)  Final      Susceptibility   Proteus mirabilis - MIC*    AMPICILLIN >=32 RESISTANT Resistant     CEFAZOLIN <=4 SENSITIVE Sensitive  CEFEPIME <=0.12 SENSITIVE Sensitive     CEFTRIAXONE <=0.25 SENSITIVE Sensitive     CIPROFLOXACIN >=4 RESISTANT Resistant     GENTAMICIN <=1 SENSITIVE Sensitive     IMIPENEM 4 SENSITIVE Sensitive     NITROFURANTOIN 128 RESISTANT Resistant     TRIMETH/SULFA >=320 RESISTANT Resistant     AMPICILLIN/SULBACTAM 8 SENSITIVE Sensitive     PIP/TAZO <=4 SENSITIVE Sensitive     * >=100,000 COLONIES/mL PROTEUS MIRABILIS    Procedures and diagnostic studies:  No results found.             LOS: 3 days   Shoua Ressler  [facility]   Pager on [email or web address]. If 7PM-7AM, please contact night-coverage at [email or web address]     10/06/2022, 4:24 PM

## 2022-10-06 NOTE — TOC Progression Note (Signed)
Transition of Care [facility]) - Progression Note    Patient Details  Name: Kevin Bradshaw MRN: [id number] Date of Birth: 04-01-1935  Transition of [facility]) CM/SW Contact  Allena Katz, LCSW Phone Number: 10/06/2022, 3:42 PM  Clinical Narrative:   [facility] still pending.     Expected Discharge Plan: Assisted Living Barriers to Discharge: Continued Medical Work up  Expected Discharge Plan and Services     Post Acute Care Choice: Durable Medical Equipment Living arrangements for the past 2 months: Assisted Living Facility                                       Social Determinants of Health (SDOH) Interventions SDOH Screenings   Tobacco Use: Medium Risk (10/03/2022)    Readmission Risk Interventions     No data to display

## 2022-10-06 NOTE — Care Management Important Message (Signed)
Important Message  Patient Details  Name: Kevin Bradshaw MRN: [id number] Date of Birth: 04/02/1935   Important Message Given:  N/A - LOS <3 / Initial given by admissions     Olegario Messier A [name] 10/06/2022, 9:02 AM

## 2022-10-06 NOTE — Progress Notes (Signed)
Physical Therapy Treatment Patient Details Name: Kevin Bradshaw MRN: [id number] DOB: Mar 22, 1935 Today's Date: 10/06/2022   History of Present Illness 87 y.o. male presented to the ED brought in by family for symptoms of R sided facial droop, slurred speech and confusion. No CVA on MRI, confusion from UTI. PMH: chronic A-fib, frequent falls, HTN, HLD, Parkinson's disease with chronic ambulation impairment, multiple CVAs since 2019    PT Comments  Pt is received in bed, he is agreeable to PT session. Pt is AO x2 to self and place. Pt performs bed mobility min A and transfers min A x2 for safety. Pt required frequent cuing when sitting EOB due to difficulty with follow instructions and sequencing. During bed>recliner step pivot transfer, Pt required min A x2 for safety and fall risk secondary to notable unsteadiness. Additionally, Pt able to perform standing BLE strengthening exercises with and without RW to promote higher level balance. Notable posterior push when using RW during STS although able to correct with tactile cuing. Overall, Pt requires multimodal cuing for completion of tasks and hand placements. Pt is steadily progressing towards PT goals. Pt would benefit from cont skilled PT to address above deficits and promote optimal return to PLOF.   If plan is discharge home, recommend the following: A lot of help with walking and/or transfers;A lot of help with bathing/dressing/bathroom   Can travel by private vehicle     No  Equipment Recommendations  Other (comment) (TBD at next facility)    Recommendations for Other Services       Precautions / Restrictions Precautions Precautions: Fall Restrictions Weight Bearing Restrictions: No     Mobility  Bed Mobility Overal bed mobility: Needs Assistance Bed Mobility: Supine to Sit     Supine to sit: Min assist, HOB elevated, Used rails     General bed mobility comments: frequent cuing for hand placement and sitting EOB; required min  A for BLE management to assist in facilitation of task    Transfers Overall transfer level: Needs assistance Equipment used: Rolling walker (2 wheels) Transfers: Bed to chair/wheelchair/BSC Sit to Stand: Min assist, +2 physical assistance   Step pivot transfers: Min assist, +2 physical assistance       General transfer comment: STS and step pivot transfer using RW min A x2 for initiation and completion of task    Ambulation/Gait               General Gait Details: Deferred at this time   Stairs             Wheelchair Mobility     Tilt Bed    Modified Rankin (Stroke Patients Only)       Balance Overall balance assessment: Needs assistance, History of Falls Sitting-balance support: Feet supported, Single extremity supported, No upper extremity supported Sitting balance-Leahy Scale: Good Sitting balance - Comments: slight notable R lateral lean initially but able to correct with cuing; able to maintain seated EOB without BUE support briefly Postural control: Right lateral lean Standing balance support: Bilateral upper extremity supported, During functional activity, Reliant on assistive device for balance Standing balance-Leahy Scale: Fair Standing balance comment: high fall risk; poor safety awareness; heavy reliance on AD and frequent cuing                            Cognition Arousal: Alert Behavior During Therapy: WFL for tasks assessed/performed Overall Cognitive Status: History of cognitive impairments - at baseline  General Comments: AO x2 disoriented to situation and time; Pleasant and cooperative throughout session        Exercises Other Exercises Other Exercises: 1x5 STS withRW, CGA; 1x5 STS CGA-min A without AD; x10 standing marches with RW    General Comments General comments (skin integrity, edema, etc.): noted to have BM starting in bed      Pertinent Vitals/Pain Pain  Assessment Pain Assessment: No/denies pain    Home Living                          Prior Function            PT Goals (current goals can now be found in the care plan section) Acute Rehab PT Goals Patient Stated Goal: to go home PT Goal Formulation: With patient Time For Goal Achievement: 10/17/22 Potential to Achieve Goals: Good Progress towards PT goals: Progressing toward goals    Frequency    Min 1X/week      PT Plan      Co-evaluation              AM-PAC PT "6 Clicks" Mobility   Outcome Measure  Help needed turning from your back to your side while in a flat bed without using bedrails?: A Little Help needed moving from lying on your back to sitting on the side of a flat bed without using bedrails?: A Lot Help needed moving to and from a bed to a chair (including a wheelchair)?: A Lot Help needed standing up from a chair using your arms (e.g., wheelchair or bedside chair)?: A Lot Help needed to walk in hospital room?: Total Help needed climbing 3-5 steps with a railing? : Total 6 Click Score: 11    End of Session Equipment Utilized During Treatment: Gait belt Activity Tolerance: Patient tolerated treatment well Patient left: in chair;with call bell/phone within reach;with chair alarm set Nurse Communication: Mobility status PT Visit Diagnosis: Unsteadiness on feet (R26.81);Repeated falls (R29.6);Muscle weakness (generalized) (M62.81);History of falling (Z91.81);Difficulty in walking, not elsewhere classified (R26.2)     Time: 9147-8295 PT Time Calculation (min) (ACUTE ONLY): 17 min  Charges:    $Therapeutic Activity: 8-22 mins PT General Charges $$ ACUTE PT VISIT: 1 Visit                     Elmon Else, SPT    Juni Glaab 10/06/2022, 3:53 PM

## 2022-10-06 NOTE — Progress Notes (Signed)
Occupational Therapy Treatment Patient Details Name: Kevin Bradshaw MRN: [id number] DOB: Jan 23, 1935 Today's Date: 10/06/2022   History of present illness 87 y.o. male presented to the ED brought in by family for symptoms of R sided facial droop, slurred speech and confusion. No CVA on MRI, confusion from UTI. PMH: chronic A-fib, frequent falls, HTN, HLD, Parkinson's disease with chronic ambulation impairment, multiple CVAs since 2019   OT comments  Pt is supine in bed with HOB slightly elevated on entry to room. Easily arousable and agreeable to OT session. He denies pain. Pt performed supine to sit at EOB with Mod A, verb cues and extra time using bedrails. Noted to have good seated balance today and needing to use the bathroom d/t beginning a bowel movement. Pt required Min A for STS from EOB to RW with verb cues for hand placement/sequencing, then performed SPT to [facility] with Mod A, verb cues for safety with shuffling gait. Performed x2-3 STS for hygiene while in standing requiring CGA/MIN A to maintain standing balance during hygiene. Required total assist for posterior hygiene and MAX A for gown change. Pt returned to bed with all needs in place and will cont to require skilled acute OT services to maximize his safety and IND to return to PLOF.      If plan is discharge home, recommend the following:  A lot of help with bathing/dressing/bathroom;Direct supervision/assist for medications management;A little help with walking and/or transfers;Supervision due to cognitive status   Equipment Recommendations  Other (comment) (DEFER TO NEXT VENUE)    Recommendations for Other Services      Precautions / Restrictions Restrictions Weight Bearing Restrictions: No       Mobility Bed Mobility Overal bed mobility: Needs Assistance Bed Mobility: Supine to Sit, Sit to Supine     Supine to sit: Mod assist, HOB elevated, Used rails Sit to supine: Contact guard assist, Used rails   General bed  mobility comments: supine in bed asleep on entry    Transfers Overall transfer level: Needs assistance Equipment used: Rolling walker (2 wheels) Transfers: Sit to/from Stand Sit to Stand: Min assist, Mod assist Stand pivot transfers: Min assist, Mod assist         General transfer comment: STS from EOB with MIN A, SPT bed<>BSC with Min/Mod for safety, shuffling gait, hand placement for sequencing d/t cognitive deficits     Balance Overall balance assessment: Needs assistance, History of Falls Sitting-balance support: Feet supported, Single extremity supported Sitting balance-Leahy Scale: Good     Standing balance support: Bilateral upper extremity supported, During functional activity, Reliant on assistive device for balance Standing balance-Leahy Scale: Poor                             ADL either performed or assessed with clinical judgement   ADL Overall ADL's : Needs assistance/impaired                 Upper Body Dressing : Maximal assistance       Toilet Transfer: BSC/3in1;Stand-pivot;Moderate assistance;Rolling walker (2 wheels)   Toileting- Clothing Manipulation and Hygiene: Maximal assistance;Total assistance         General ADL Comments: total assist for hygiene after BM and Max A for donning gown    Extremity/Trunk Assessment Upper Extremity Assessment Upper Extremity Assessment: Generalized weakness            Vision       Perception  Praxis      Cognition Arousal: Alert Behavior During Therapy: WFL for tasks assessed/performed Overall Cognitive Status: History of cognitive impairments - at baseline                                 General Comments: pleasant and easily awakens, however is poor historian. Is alert to self and daughter in room        Exercises      Shoulder Instructions       General Comments noted to have BM starting in bed    Pertinent Vitals/ Pain       Pain Assessment Pain  Assessment: No/denies pain  Home Living                                          Prior Functioning/Environment              Frequency  Min 1X/week        Progress Toward Goals  OT Goals(current goals can now be found in the care plan section)  Progress towards OT goals: Progressing toward goals  Acute Rehab OT Goals Patient Stated Goal: improve safety/balance to return to ALF when ready OT Goal Formulation: With patient Time For Goal Achievement: 10/18/22 Potential to Achieve Goals: Fair  Plan      Co-evaluation                 AM-PAC OT "6 Clicks" Daily Activity     Outcome Measure   Help from another person eating meals?: A Little Help from another person taking care of personal grooming?: A Little Help from another person toileting, which includes using toliet, bedpan, or urinal?: A Lot Help from another person bathing (including washing, rinsing, drying)?: A Lot Help from another person to put on and taking off regular upper body clothing?: A Lot Help from another person to put on and taking off regular lower body clothing?: A Lot 6 Click Score: 14    End of Session Equipment Utilized During Treatment: Rolling walker (2 wheels)  OT Visit Diagnosis: Unsteadiness on feet (R26.81);Repeated falls (R29.6);Muscle weakness (generalized) (M62.81);History of falling (Z91.81)   Activity Tolerance Patient tolerated treatment well   Patient Left in bed;with call bell/phone within reach;with bed alarm set   Nurse Communication Mobility status;Other (comment) (notified of BM)        Time: 1610-9604 OT Time Calculation (min): 30 min  Charges: OT General Charges $OT Visit: 1 Visit OT Treatments $Self Care/Home Management : 23-37 mins  Holly Iannaccone, OTR/L 10/06/2022, 12:42 PM

## 2022-10-07 DIAGNOSIS — N3001 Acute cystitis with hematuria: Secondary | ICD-10-CM | POA: Diagnosis not present

## 2022-10-07 NOTE — Progress Notes (Addendum)
Occupational Therapy Treatment Patient Details Name: Kevin Bradshaw MRN: [id number] DOB: [birth year] Today's Date: 10/07/2022   History of present illness 87 y.o. male presented to the ED brought in by family for symptoms of R sided facial droop, slurred speech and confusion. No CVA on MRI, confusion from UTI. PMH: chronic A-fib, frequent falls, HTN, HLD, Parkinson's disease with chronic ambulation impairment, multiple CVAs since 2019   OT comments  Pt is seated in recliner with lunch tray in front on arrival. Pleasantly confused and agreeable to OT session. He denies pain. Pt performed self-feeding tasks with set up assist via taking lid off peaches. Noted to couch/slightly choke while eating, notified nursing tech who was to notify the primary nurse. Pt performed STS from recliner to RW requiring Mod A d/t posterior lean and need for constant cueing via verb/tactile d/t cognitive deficits with pt able to ~6 feet using RW within the room with Mod A, verb cues and walker management with severe posterior lean noted. Pt returned to recliner with all needs in place and handoff to PT staff member. He will continue to require skilled acute OT services to maximize his safety and IND to return to PLOF.       If plan is discharge home, recommend the following:  A lot of help with bathing/dressing/bathroom;Direct supervision/assist for medications management;A little help with walking and/or transfers;Supervision due to cognitive status   Equipment Recommendations  Other (comment) (defer to next venue)    Recommendations for Other Services      Precautions / Restrictions Precautions Precautions: Fall Restrictions Weight Bearing Restrictions: No       Mobility Bed Mobility                    Transfers Overall transfer level: Needs assistance Equipment used: Rolling walker (2 wheels) Transfers: Sit to/from Stand Sit to Stand: Mod assist           General transfer comment: STS  from recliner to RW requiring Mod A d/t posterior lean and need for constant cueing via verb/tactile d/t cognitive deficits with pt able to ~8 feet using RW within the room with Mod A, verb cues and walker management with severe posterior lean noted.     Balance Overall balance assessment: Needs assistance, History of Falls Sitting-balance support: Feet supported Sitting balance-Leahy Scale: Fair     Standing balance support: Bilateral upper extremity supported, Reliant on assistive device for balance Standing balance-Leahy Scale: Fair Standing balance comment: high fall risk; needs RW and external support for fall prevention                           ADL either performed or assessed with clinical judgement   ADL Overall ADL's : Needs assistance/impaired Eating/Feeding: Supervision/ safety Eating/Feeding Details (indicate cue type and reason): pt required assistance taking top off peaches on lunch tray and noted to cough/choke while eating them. Educated on taking small bites and chewing up more. Notified NT who was to notify pt's primary nurse.                                        Extremity/Trunk Assessment Upper Extremity Assessment Upper Extremity Assessment: Generalized weakness            Vision       Perception     Praxis  Cognition Arousal: Alert Behavior During Therapy: WFL for tasks assessed/performed Overall Cognitive Status: History of cognitive impairments - at baseline                                 General Comments: AO x2 disoriented to situation and time; Pleasant and cooperative throughout session        Exercises      Shoulder Instructions       General Comments      Pertinent Vitals/ Pain       Pain Assessment Pain Assessment: No/denies pain  Home Living                                          Prior Functioning/Environment              Frequency  Min 1X/week         Progress Toward Goals  OT Goals(current goals can now be found in the care plan section)  Progress towards OT goals: Progressing toward goals  Acute Rehab OT Goals Patient Stated Goal: be able to return to ALF if safely able to do so OT Goal Formulation: With patient Time For Goal Achievement: 10/18/22 Potential to Achieve Goals: Fair  Plan      Co-evaluation                 AM-PAC OT "6 Clicks" Daily Activity     Outcome Measure   Help from another person eating meals?: A Little Help from another person taking care of personal grooming?: A Little Help from another person toileting, which includes using toliet, bedpan, or urinal?: A Lot Help from another person bathing (including washing, rinsing, drying)?: A Lot Help from another person to put on and taking off regular upper body clothing?: A Lot Help from another person to put on and taking off regular lower body clothing?: A Lot 6 Click Score: 14    End of Session Equipment Utilized During Treatment: Rolling walker (2 wheels)  OT Visit Diagnosis: Unsteadiness on feet (R26.81);Repeated falls (R29.6);Muscle weakness (generalized) (M62.81);History of falling (Z91.81)   Activity Tolerance Patient tolerated treatment well   Patient Left with call bell/phone within reach;in chair (handoff to PT)   Nurse Communication Mobility status;Other (comment) (possible choking on peaches)        Time: 0272-5366 OT Time Calculation (min): 16 min  Charges: OT General Charges $OT Visit: 1 Visit OT Treatments $Therapeutic Activity: 8-22 mins  Toleen Lachapelle, OTR/L  10/07/22, 3:51 PM   Dresean Beckel E Jarrett Chicoine 10/07/2022, 3:49 PM

## 2022-10-07 NOTE — Progress Notes (Signed)
Progress Note    Kevin Bradshaw  JOA:[id number] DOB: 03-28-1935  DOA: 10/03/2022 PCP: Gracelyn Nurse, MD      Brief Narrative:    Medical records reviewed and are as summarized below:  Kevin Bradshaw is a 87 y.o. male with medical history significant of chronic A-fib not on anticoagulation due to frequent falls, HTN, HLD, Parkinson's disease with chronic ambulation impairment and frequent falls, multiple CVAs since 2019 on aspirin and Plavix, brought in by family member for evaluation of confusion.        Assessment/Plan:   Principal Problem:   UTI (urinary tract infection) Active Problems:   Atrial fibrillation with RVR (HCC)   Acute metabolic encephalopathy   History of stroke    Body mass index is 24.76 kg/m.   Acute UTI: Urine culture showed Proteus mirabilis.  Initially treated with IV ceftriaxone.  Continue Keflex   Acute metabolic encephalopathy: Likely due to acute UTI.  Improved. No acute stroke on MRI brain but subacute to chronic infarct suspected in the left parietal area.   History of stroke, Parkinson's disease, dementia, frequent falls: PT recommended discharge to SNF.  Awaiting placement.   Permanent atrial fibrillation: Not on chronic anticoagulation because of high fall risk.  Kevin Bradshaw is on aspirin and Plavix.   Other comorbidities include hypertension, BPH    Diet Order             Diet Heart Fluid consistency: Thin  Diet effective now                            Consultants: None  Procedures: None    Medications:    aspirin EC  81 mg Oral Daily   atorvastatin  80 mg Oral q1800   cephALEXin  500 mg Oral Q8H   clopidogrel  75 mg Oral Daily   enoxaparin (LOVENOX) injection  40 mg Subcutaneous QHS   feeding supplement  237 mL Oral TID BM   finasteride  5 mg Oral Daily   metoprolol tartrate  25 mg Oral BID   tamsulosin  0.4 mg Oral Daily   Continuous Infusions:     Anti-infectives (From admission,  onward)    Start     Dose/Rate Route Frequency Ordered Stop   10/07/22 0600  cephALEXin (KEFLEX) capsule 500 mg        500 mg Oral Every 8 hours 10/06/22 1616 10/08/22 0559   10/03/22 0915  cefTRIAXone (ROCEPHIN) 2 g in sodium chloride 0.9 % 100 mL IVPB  Status:  Discontinued        2 g 200 mL/hr over 30 Minutes Intravenous Every 24 hours 10/03/22 0913 10/06/22 1616              Family Communication/Anticipated D/C date and plan/Code Status   DVT prophylaxis: enoxaparin (LOVENOX) injection 40 mg Start: 10/03/22 2200     Code Status: Limited: Do not attempt resuscitation (DNR) -DNR-LIMITED -Do Not Intubate/DNI   Family Communication: None Disposition Plan: Plan to discharge to SNF   Status is: Inpatient Remains inpatient appropriate because: Awaiting placement to SNF.  Insurance authorization is pending.       Subjective:   Interval events noted.  Kevin Bradshaw has no complaints and Kevin Bradshaw feels better.  Objective:    Vitals:   10/06/22 1703 10/06/22 1950 10/07/22 0522 10/07/22 0728  BP: (!) 150/88 (!) 120/57 (!) 147/83 119/75  Pulse: 75 90  93  Resp:  20 19 17   Temp: 97.9 F (36.6 C) 97.9 F (36.6 C) (!) 97.4 F (36.3 C) 98.1 F (36.7 C)  TempSrc:  Oral Oral   SpO2: 100% 95% (!) 82% 96%  Weight:      Height:       No data found.   Intake/Output Summary (Last 24 hours) at 10/07/2022 1519 Last data filed at 10/07/2022 1503 Gross per 24 hour  Intake 240 ml  Output 450 ml  Net -210 ml    Filed Weights   10/03/22 0731 10/03/22 1750  Weight: 72.6 kg 63.4 kg    Exam:  GEN: NAD SKIN: Warm and dry EYES: No pallor or icterus ENT: MMM CV: RRR PULM: CTA B ABD: soft, ND, NT, +BS CNS: AAO x 2 (person and place), non focal EXT: No edema or tenderness       Data Reviewed:   I have personally reviewed following labs and imaging studies:  Labs: Labs show the following:   Basic Metabolic Panel: Recent Labs  Lab 10/03/22 0750  NA 140  K 4.2  CL 106   CO2 27  GLUCOSE 98  BUN 18  CREATININE 1.01  CALCIUM 9.3   GFR Estimated Creatinine Clearance: 43 mL/min (by C-G formula based on SCr of 1.01 mg/dL). Liver Function Tests: Recent Labs  Lab 10/03/22 0750  AST 30  ALT 20  ALKPHOS 77  BILITOT 1.3*  PROT 7.5  ALBUMIN 3.7   No results for input(s): "LIPASE", "AMYLASE" in the last 168 hours. No results for input(s): "AMMONIA" in the last 168 hours. Coagulation profile No results for input(s): "INR", "PROTIME" in the last 168 hours.  CBC: Recent Labs  Lab 10/03/22 0750  WBC 5.1  NEUTROABS 3.3  HGB 14.7  HCT 45.6  MCV 95.4  PLT 207   Cardiac Enzymes: No results for input(s): "CKTOTAL", "CKMB", "CKMBINDEX", "TROPONINI" in the last 168 hours. BNP (last 3 results) No results for input(s): "PROBNP" in the last 8760 hours. CBG: No results for input(s): "GLUCAP" in the last 168 hours. D-Dimer: No results for input(s): "DDIMER" in the last 72 hours. Hgb A1c: No results for input(s): "HGBA1C" in the last 72 hours. Lipid Profile: No results for input(s): "CHOL", "HDL", "LDLCALC", "TRIG", "CHOLHDL", "LDLDIRECT" in the last 72 hours.  Thyroid function studies: No results for input(s): "TSH", "T4TOTAL", "T3FREE", "THYROIDAB" in the last 72 hours.  Invalid input(s): "FREET3" Anemia work up: No results for input(s): "VITAMINB12", "FOLATE", "FERRITIN", "TIBC", "IRON", "RETICCTPCT" in the last 72 hours. Sepsis Labs: Recent Labs  Lab 10/03/22 0750  WBC 5.1    Microbiology Recent Results (from the past 240 hour(s))  Urine Culture (for pregnant, neutropenic or urologic patients or patients with an indwelling urinary catheter)     Status: Abnormal   Collection Time: 10/04/22 11:00 AM   Specimen: Urine, Clean Catch  Result Value Ref Range Status   Specimen Description   Final    URINE, CLEAN CATCH Performed at [facility], [address] [place], [place] [address]    Special Requests   Final    NONE Performed  at [facility], [address] [place], [place] [address]    Culture >=100,000 COLONIES/mL PROTEUS MIRABILIS (A)  Final   Report Status 10/06/2022 FINAL  Final   Organism ID, Bacteria PROTEUS MIRABILIS (A)  Final      Susceptibility   Proteus mirabilis - MIC*    AMPICILLIN >=32 RESISTANT Resistant     CEFAZOLIN <=4 SENSITIVE Sensitive  CEFEPIME <=0.12 SENSITIVE Sensitive     CEFTRIAXONE <=0.25 SENSITIVE Sensitive     CIPROFLOXACIN >=4 RESISTANT Resistant     GENTAMICIN <=1 SENSITIVE Sensitive     IMIPENEM 4 SENSITIVE Sensitive     NITROFURANTOIN 128 RESISTANT Resistant     TRIMETH/SULFA >=320 RESISTANT Resistant     AMPICILLIN/SULBACTAM 8 SENSITIVE Sensitive     PIP/TAZO <=4 SENSITIVE Sensitive     * >=100,000 COLONIES/mL PROTEUS MIRABILIS    Procedures and diagnostic studies:  No results found.             LOS: 4 days   Monie Shere  [facility]   Pager on [email or web address]. If 7PM-7AM, please contact night-coverage at [email or web address]     10/07/2022, 3:19 PM

## 2022-10-07 NOTE — Care Management Important Message (Signed)
Important Message  Patient Details  Name: Kevin Bradshaw MRN: [id number] Date of Birth: August 26, 1935   Important Message Given:  Yes - Medicare IM     Verita Schneiders [name] 10/07/2022, 10:27 AM

## 2022-10-07 NOTE — Progress Notes (Signed)
Mobility Specialist - Progress Note   10/07/22 1058  Mobility  Activity Dangled on edge of bed;Stood at bedside;Ambulated with assistance in room;Transferred from bed to chair  Level of Assistance Contact guard assist, steadying assist  Assistive Device Front wheel walker  Distance Ambulated (ft) 12 ft  Activity Response Tolerated well  $Mobility charge 1 Mobility  Mobility Specialist Start Time (ACUTE ONLY) 1039  Mobility Specialist Stop Time (ACUTE ONLY) 1054  Mobility Specialist Time Calculation (min) (ACUTE ONLY) 15 min   Pt supine upon entry, utilizing RA. Pt agreeable to OOB amb this date. Pt completed bed mob MaxA, STS to RW MinA +2 for safety-- post lean upon standing, VC's to bring BLE closer to person. Pt took ~8 steps forward and ~4 backward steps, difficulty progressing RLE requiring multimodal cueing for sequencing and RW navigation. Pt left seated in the recliner with alarm set and needs within reach.  Zetta Bills Mobility Specialist 10/07/22 11:06 AM

## 2022-10-07 NOTE — Progress Notes (Addendum)
Mobility Specialist - Progress Note   10/07/22 1620  Mobility  Activity Ambulated with assistance to bathroom  Level of Assistance Moderate assist, patient does 50-74%  Assistive Device Front wheel walker  Distance Ambulated (ft) 24 ft  Activity Response Tolerated well  $Mobility charge 1 Mobility   Pt side lying upon entry, A and O x1. Pt completed bed mob MinA for trunk support and STS to RW MinA. Pt required multimodal cueing for hand placement on the RW, keeping body within the RW, sequencing and RW navigation. During amb to the bathroom, Pt would turn his R hip towards the head of the RW causing "side walking", required physical assist for proper body mechanics. Pt returned to bed, left supine with alarm set and needs within reach.  Zetta Bills Mobility Specialist 10/07/22 4:30 PM

## 2022-10-07 NOTE — Progress Notes (Signed)
Physical Therapy Treatment Patient Details Name: Kevin Bradshaw MRN: [id number] DOB: 1935/02/01 Today's Date: 10/07/2022   History of Present Illness 87 y.o. male presented to the ED brought in by family for symptoms of R sided facial droop, slurred speech and confusion. No CVA on MRI, confusion from UTI. PMH: chronic A-fib, frequent falls, HTN, HLD, Parkinson's disease with chronic ambulation impairment, multiple CVAs since 2019    PT Comments  Patient received in recliner, finishing session with OT. Patient is agreeable to PT session. He is pleasant throughout session. He is able to stand with mod A from recliner. Ambulated 12 feet with RW and min A. Patient with unequal step length, shuffle steps. He is at increased fall risk and will continue to benefit from skilled PT to improve strength and functional independence.     If plan is discharge home, recommend the following: A lot of help with walking and/or transfers;A lot of help with bathing/dressing/bathroom;Help with stairs or ramp for entrance;Assist for transportation   Can travel by private vehicle     No  Equipment Recommendations  Rolling walker (2 wheels)    Recommendations for Other Services       Precautions / Restrictions Precautions Precautions: Fall Restrictions Weight Bearing Restrictions: No     Mobility  Bed Mobility Overal bed mobility: Needs Assistance Bed Mobility: Sit to Supine       Sit to supine: Min assist   General bed mobility comments: Assist for positioning    Transfers Overall transfer level: Needs assistance Equipment used: Rolling walker (2 wheels) Transfers: Sit to/from Stand Sit to Stand: Mod assist                Ambulation/Gait Ambulation/Gait assistance: Contact guard assist Gait Distance (Feet): 12 Feet Assistive device: Rolling walker (2 wheels) Gait Pattern/deviations: Step-through pattern, Decreased step length - right, Decreased step length - left, Decreased stride  length, Shuffle, Narrow base of support, Leaning posteriorly Gait velocity: decr     General Gait Details: patient ambulates with posterior lean, decreased foot clearance bilaterally. Decreased step length/consistency on left side.   Stairs             Wheelchair Mobility     Tilt Bed    Modified Rankin (Stroke Patients Only)       Balance Overall balance assessment: Needs assistance, History of Falls Sitting-balance support: Feet supported Sitting balance-Leahy Scale: Fair     Standing balance support: Bilateral upper extremity supported, During functional activity, Reliant on assistive device for balance Standing balance-Leahy Scale: Fair Standing balance comment: high fall risk; needs RW and external support for fall prevention                            Cognition Arousal: Alert Behavior During Therapy: WFL for tasks assessed/performed Overall Cognitive Status: History of cognitive impairments - at baseline                                 General Comments: AO x2 disoriented to situation and time; Pleasant and cooperative throughout session        Exercises      General Comments        Pertinent Vitals/Pain Pain Assessment Pain Assessment: Faces Faces Pain Scale: Hurts a little bit Pain Location: back Pain Descriptors / Indicators: Aching Pain Intervention(s): Monitored during session, Repositioned    Home Living  Prior Function            PT Goals (current goals can now be found in the care plan section) Acute Rehab PT Goals Patient Stated Goal: to go home PT Goal Formulation: With patient Time For Goal Achievement: 10/17/22 Potential to Achieve Goals: Fair Progress towards PT goals: Progressing toward goals    Frequency    Min 1X/week      PT Plan      Co-evaluation              AM-PAC PT "6 Clicks" Mobility   Outcome Measure  Help needed turning from your  back to your side while in a flat bed without using bedrails?: A Little Help needed moving from lying on your back to sitting on the side of a flat bed without using bedrails?: A Little Help needed moving to and from a bed to a chair (including a wheelchair)?: A Little Help needed standing up from a chair using your arms (e.g., wheelchair or bedside chair)?: A Lot Help needed to walk in hospital room?: A Lot Help needed climbing 3-5 steps with a railing? : Total 6 Click Score: 14    End of Session Equipment Utilized During Treatment: Gait belt Activity Tolerance: Patient tolerated treatment well Patient left: in bed;with call bell/phone within reach;with bed alarm set Nurse Communication: Mobility status PT Visit Diagnosis: Unsteadiness on feet (R26.81);Other abnormalities of gait and mobility (R26.89);Muscle weakness (generalized) (M62.81);Difficulty in walking, not elsewhere classified (R26.2);Repeated falls (R29.6)     Time: 1400-1413 PT Time Calculation (min) (ACUTE ONLY): 13 min  Charges:    $Gait Training: 8-22 mins PT General Charges $$ ACUTE PT VISIT: 1 Visit                     Aicha Clingenpeel, PT, GCS 10/07/22,2:23 PM

## 2022-10-08 DIAGNOSIS — N3001 Acute cystitis with hematuria: Secondary | ICD-10-CM | POA: Diagnosis not present

## 2022-10-08 NOTE — Progress Notes (Signed)
Physical Therapy Treatment Patient Details Name: Kevin Bradshaw MRN: [id number] DOB: 06-25-1935 Today's Date: 10/08/2022   History of Present Illness 87 y.o. male presented to the ED brought in by family for symptoms of R sided facial droop, slurred speech and confusion. No CVA on MRI, confusion from UTI. PMH: chronic A-fib, frequent falls, HTN, HLD, Parkinson's disease with chronic ambulation impairment, multiple CVAs since 2019    PT Comments  Pt was sitting in recliner upon arrival. He is A but only oriented to self. He remains pleasantly confused throughout. Pt was able to stand and ambulate with RW however constant freezing observed. Pt has severe parkinsonian gait pattern. Attempted ambulation without RW but with BUE HHA +2 he has less freezing however remains at high risk of fall. Pt returned to room and had a successful BM on BSC. Contacted pt's daughter as requested by patient. She will be visiting hospital later this date. At conclusion of session, pt was sitting in recliner with chair alarm in place and call bell in reach. RN tech was also in room. DC recs remain appropriate.    If plan is discharge home, recommend the following: A lot of help with walking and/or transfers;A lot of help with bathing/dressing/bathroom;Help with stairs or ramp for entrance;Assist for transportation     Equipment Recommendations  Rolling walker (2 wheels)       Precautions / Restrictions Precautions Precautions: Fall Restrictions Weight Bearing Restrictions: No     Mobility  Bed Mobility  General bed mobility comments: pt was in recliner pre/post session    Transfers Overall transfer level: Needs assistance Equipment used: Rolling walker (2 wheels) Transfers: Sit to/from Stand Sit to Stand: Min assist, Mod assist  General transfer comment: Pt struggles with freezing throughout session. constant Vc/tcs for initiation of movements.    Ambulation/Gait Ambulation/Gait assistance: Min assist,  Mod assist Gait Distance (Feet): 30 Feet Assistive device: Rolling walker (2 wheels), 2 person hand held assist Gait Pattern/deviations: Shuffle, Narrow base of support, Decreased step length - right, Decreased step length - left, Decreased stance time - right, Decreased stance time - left Gait velocity: decr  General Gait Details: Pt has severe parkinsonian gait pattern. Freezing noted throughout with assistance to intitiate movements.     Balance Overall balance assessment: Needs assistance, History of Falls Sitting-balance support: Feet supported Sitting balance-Leahy Scale: Fair     Standing balance support: During functional activity Standing balance-Leahy Scale: Poor Standing balance comment: high fall risk; needs UE  external support for fall prevention       Cognition Arousal: Alert Behavior During Therapy: WFL for tasks assessed/performed Overall Cognitive Status: History of cognitive impairments - at baseline    General Comments: Pt is oriented to self only. He was agreeable to session with encouragement but did fully participate. Pt lacks insight of current situation  or POC going forward           General Comments General comments (skin integrity, edema, etc.): Pt has successful BM on BSC. He requested author contact his daughter. (Daughter) [name] answered and will be coming to hospital to visit later this date.      Pertinent Vitals/Pain Pain Assessment Pain Assessment: No/denies pain Faces Pain Scale: No hurt     PT Goals (current goals can now be found in the care plan section) Acute Rehab PT Goals Patient Stated Goal: none stated Progress towards PT goals: Not progressing toward goals - comment    Frequency    Min 1X/week  AM-PAC PT "6 Clicks" Mobility   Outcome Measure  Help needed turning from your back to your side while in a flat bed without using bedrails?: A Little Help needed moving from lying on your back to sitting on the side of a  flat bed without using bedrails?: A Lot Help needed moving to and from a bed to a chair (including a wheelchair)?: A Lot Help needed standing up from a chair using your arms (e.g., wheelchair or bedside chair)?: A Lot Help needed to walk in hospital room?: A Lot Help needed climbing 3-5 steps with a railing? : A Lot 6 Click Score: 13    End of Session   Activity Tolerance: Patient tolerated treatment well;Patient limited by fatigue Patient left: in chair;with call bell/phone within reach;with nursing/sitter in room Nurse Communication: Mobility status PT Visit Diagnosis: Unsteadiness on feet (R26.81);Other abnormalities of gait and mobility (R26.89);Muscle weakness (generalized) (M62.81);Difficulty in walking, not elsewhere classified (R26.2);Repeated falls (R29.6)     Time: 1610-9604 PT Time Calculation (min) (ACUTE ONLY): 16 min  Charges:    $Gait Training: 8-22 mins PT General Charges $$ ACUTE PT VISIT: 1 Visit                     Jetta Lout PTA 10/08/22, 3:46 PM

## 2022-10-08 NOTE — TOC Progression Note (Addendum)
Transition of Care [facility]) - Progression Note    Patient Details  Name: Kento Gossman MRN: [id number] Date of Birth: [birth year]  Transition of Care [facility]) CM/SW Contact  Erin Sons, [place] Phone Number: 10/08/2022, 4:03 PM  Clinical Narrative:     [facility]erk[facility] Harvey still pending at this time. TOC will continue to follow.   1628: Auth approved though [employer] cannot admit until Monday.   Expected Discharge Plan: SNF Barriers to Discharge: Continued Medical Work up  Expected Discharge Plan and Services     Post Acute Care Choice: Durable Medical Equipment Living arrangements for the past 2 months: Assisted Living Facility                                       Social Determinants of Health (SDOH) Interventions SDOH Screenings   Tobacco Use: Medium Risk (10/03/2022)    Readmission Risk Interventions     No data to display

## 2022-10-08 NOTE — Progress Notes (Signed)
Mobility Specialist - Progress Note   10/08/22 1007  Mobility  Activity Ambulated with assistance in room;Transferred from bed to chair  Level of Assistance Moderate assist, patient does 50-74%  Assistive Device None;Front wheel walker  Distance Ambulated (ft) 12 ft  Activity Response Tolerated well  $Mobility charge 1 Mobility  Mobility Specialist Start Time (ACUTE ONLY) 0934  Mobility Specialist Stop Time (ACUTE ONLY) 1000  Mobility Specialist Time Calculation (min) (ACUTE ONLY) 26 min   Pt supine upon entry, utilizing RA. Pt completed bed mob HHA to bring trunk from supine to sitting and MinA to bring BLE EOB. Pt STS MinA, narrow BOS with flexed trunk. Pt amb 4' without AD +2 for safety-- slight BLE buckle, requiring ModA for progression and body position. Pt amb the remainder 8' to the recliner with RW, requiring VC's for sequencing and RW navigation. Pt left seated in the recliner with alarm set and needs within reach. NT notified.   Zetta Bills Mobility Specialist 10/08/22 10:18 AM\

## 2022-10-08 NOTE — TOC Progression Note (Signed)
Transition of Care [facility]) - Progression Note    Patient Details  Name: Kevin Bradshaw MRN: [id number] Date of Birth: 1935-06-27  Transition of Care [facility]) CM/SW Contact  Erin Sons, [place] Phone Number: 10/08/2022, 12:14 PM  Clinical Narrative:     Berkley Harvey still pending at this time. TOC will continue to follow.   Expected Discharge Plan: Assisted Living Barriers to Discharge: Insurance auth  Expected Discharge Plan and Services     Post Acute Care Choice: Durable Medical Equipment Living arrangements for the past 2 months: Assisted Living Facility                                       Social Determinants of Health (SDOH) Interventions SDOH Screenings   Tobacco Use: Medium Risk (10/03/2022)    Readmission Risk Interventions     No data to display

## 2022-10-08 NOTE — Plan of Care (Signed)
  Problem: Education: Goal: Knowledge of secondary prevention will improve (MUST DOCUMENT ALL) Outcome: Progressing Goal: Knowledge of patient specific risk factors will improve Loraine Leriche N/A or DELETE if not current risk factor) Outcome: Progressing   Problem: Ischemic Stroke/TIA Tissue Perfusion: Goal: Complications of ischemic stroke/TIA will be minimized Outcome: Progressing   Problem: Coping: Goal: Will verbalize positive feelings about self Outcome: Progressing Goal: Will identify appropriate support needs Outcome: Progressing   Problem: Health Behavior/Discharge Planning: Goal: Ability to manage health-related needs will improve Outcome: Progressing Goal: Goals will be collaboratively established with patient/family Outcome: Progressing   Problem: Self-Care: Goal: Verbalization of feelings and concerns over difficulty with self-care will improve Outcome: Progressing Goal: Ability to communicate needs accurately will improve Outcome: Progressing   Problem: Nutrition: Goal: Risk of aspiration will decrease Outcome: Progressing Goal: Dietary intake will improve Outcome: Progressing   Problem: Education: Goal: Knowledge of General Education information will improve Description: Including pain rating scale, medication(s)/side effects and non-pharmacologic comfort measures Outcome: Progressing   Problem: Clinical Measurements: Goal: Ability to maintain clinical measurements within normal limits will improve Outcome: Progressing Goal: Will remain free from infection Outcome: Progressing Goal: Diagnostic test results will improve Outcome: Progressing Goal: Respiratory complications will improve Outcome: Progressing Goal: Cardiovascular complication will be avoided Outcome: Progressing   Problem: Activity: Goal: Risk for activity intolerance will decrease Outcome: Progressing   Problem: Nutrition: Goal: Adequate nutrition will be maintained Outcome: Progressing    Problem: Coping: Goal: Level of anxiety will decrease Outcome: Progressing   Problem: Elimination: Goal: Will not experience complications related to bowel motility Outcome: Progressing Goal: Will not experience complications related to urinary retention Outcome: Progressing   Problem: Pain Managment: Goal: General experience of comfort will improve Outcome: Progressing   Problem: Safety: Goal: Ability to remain free from injury will improve Outcome: Progressing   Problem: Skin Integrity: Goal: Risk for impaired skin integrity will decrease Outcome: Progressing   Problem: Education: Goal: Knowledge of disease or condition will improve Outcome: Not Progressing   Problem: Self-Care: Goal: Ability to participate in self-care as condition permits will improve Outcome: Not Progressing   Problem: Health Behavior/Discharge Planning: Goal: Ability to manage health-related needs will improve Outcome: Not Progressing

## 2022-10-08 NOTE — Progress Notes (Signed)
Progress Note    Kevin Bradshaw  ZOX:[id number] DOB: 12-Nov-1935  DOA: 10/03/2022 PCP: Gracelyn Nurse, MD      Brief Narrative:    Medical records reviewed and are as summarized below:  Kevin Bradshaw is a 87 y.o. male with medical history significant of chronic A-fib not on anticoagulation due to frequent falls, HTN, HLD, Parkinson's disease with chronic ambulation impairment and frequent falls, multiple CVAs since 2019 on aspirin and Plavix, brought in by family member for evaluation of confusion.        Assessment/Plan:   Principal Problem:   UTI (urinary tract infection) Active Problems:   Atrial fibrillation with RVR (HCC)   Acute metabolic encephalopathy   History of stroke    Body mass index is 24.76 kg/m.   Acute UTI: Urine culture showed Proteus mirabilis.  Completed 5 days of antibiotics (Initially treated with IV ceftriaxone followed by Keflex.  Acute metabolic encephalopathy: Likely due to acute UTI.  Improved. No acute stroke on MRI brain but subacute to chronic infarct suspected in the left parietal area.   History of stroke, Parkinson's disease, dementia, frequent falls: PT recommended discharge to SNF.  Awaiting placement.   Permanent atrial fibrillation: Not on chronic anticoagulation because of high fall risk.  He is on aspirin and Plavix.   Other comorbidities include hypertension, BPH    Diet Order             Diet Heart Fluid consistency: Thin  Diet effective now                            Consultants: None  Procedures: None    Medications:    aspirin EC  81 mg Oral Daily   atorvastatin  80 mg Oral q1800   clopidogrel  75 mg Oral Daily   enoxaparin (LOVENOX) injection  40 mg Subcutaneous QHS   feeding supplement  237 mL Oral TID BM   finasteride  5 mg Oral Daily   metoprolol tartrate  25 mg Oral BID   tamsulosin  0.4 mg Oral Daily   Continuous Infusions:     Anti-infectives (From admission,  onward)    Start     Dose/Rate Route Frequency Ordered Stop   10/07/22 0600  cephALEXin (KEFLEX) capsule 500 mg        500 mg Oral Every 8 hours 10/06/22 1616 10/07/22 2231   10/03/22 0915  cefTRIAXone (ROCEPHIN) 2 g in sodium chloride 0.9 % 100 mL IVPB  Status:  Discontinued        2 g 200 mL/hr over 30 Minutes Intravenous Every 24 hours 10/03/22 0913 10/06/22 1616              Family Communication/Anticipated D/C date and plan/Code Status   DVT prophylaxis: enoxaparin (LOVENOX) injection 40 mg Start: 10/03/22 2200     Code Status: Limited: Do not attempt resuscitation (DNR) -DNR-LIMITED -Do Not Intubate/DNI   Family Communication: None Disposition Plan: Plan to discharge to SNF   Status is: Inpatient Remains inpatient appropriate because: Awaiting placement to SNF.  Insurance authorization is pending.       Subjective:   He has no complaints.  He feels better.  Objective:    Vitals:   10/07/22 1539 10/07/22 2122 10/08/22 0614 10/08/22 1628  BP: 123/65 128/75 120/72 119/76  Pulse: 66 95 75 82  Resp: 17 20 18 19   Temp: 98 F (36.7 C) 98.6  F (37 C) 98.5 F (36.9 C) 98.8 F (37.1 C)  TempSrc:   Oral Oral  SpO2: 97% 93% 95% 97%  Weight:      Height:       No data found.   Intake/Output Summary (Last 24 hours) at 10/08/2022 1742 Last data filed at 10/07/2022 1846 Gross per 24 hour  Intake --  Output 400 ml  Net -400 ml    Filed Weights   10/03/22 0731 10/03/22 1750  Weight: 72.6 kg 63.4 kg    Exam:  GEN: NAD, sitting up in the chair SKIN: Warm and dry EYES: No pallor or icterus ENT: MMM CV: RRR PULM: CTA B ABD: soft, ND, NT, +BS CNS: AAO x 2 person and place, non focal EXT: No edema or tenderness       Data Reviewed:   I have personally reviewed following labs and imaging studies:  Labs: Labs show the following:   Basic Metabolic Panel: Recent Labs  Lab 10/03/22 0750  NA 140  K 4.2  CL 106  CO2 27  GLUCOSE 98  BUN  18  CREATININE 1.01  CALCIUM 9.3   GFR Estimated Creatinine Clearance: 43 mL/min (by C-G formula based on SCr of 1.01 mg/dL). Liver Function Tests: Recent Labs  Lab 10/03/22 0750  AST 30  ALT 20  ALKPHOS 77  BILITOT 1.3*  PROT 7.5  ALBUMIN 3.7   No results for input(s): "LIPASE", "AMYLASE" in the last 168 hours. No results for input(s): "AMMONIA" in the last 168 hours. Coagulation profile No results for input(s): "INR", "PROTIME" in the last 168 hours.  CBC: Recent Labs  Lab 10/03/22 0750  WBC 5.1  NEUTROABS 3.3  HGB 14.7  HCT 45.6  MCV 95.4  PLT 207   Cardiac Enzymes: No results for input(s): "CKTOTAL", "CKMB", "CKMBINDEX", "TROPONINI" in the last 168 hours. BNP (last 3 results) No results for input(s): "PROBNP" in the last 8760 hours. CBG: No results for input(s): "GLUCAP" in the last 168 hours. D-Dimer: No results for input(s): "DDIMER" in the last 72 hours. Hgb A1c: No results for input(s): "HGBA1C" in the last 72 hours. Lipid Profile: No results for input(s): "CHOL", "HDL", "LDLCALC", "TRIG", "CHOLHDL", "LDLDIRECT" in the last 72 hours.  Thyroid function studies: No results for input(s): "TSH", "T4TOTAL", "T3FREE", "THYROIDAB" in the last 72 hours.  Invalid input(s): "FREET3" Anemia work up: No results for input(s): "VITAMINB12", "FOLATE", "FERRITIN", "TIBC", "IRON", "RETICCTPCT" in the last 72 hours. Sepsis Labs: Recent Labs  Lab 10/03/22 0750  WBC 5.1    Microbiology Recent Results (from the past 240 hour(s))  Urine Culture (for pregnant, neutropenic or urologic patients or patients with an indwelling urinary catheter)     Status: Abnormal   Collection Time: 10/04/22 11:00 AM   Specimen: Urine, Clean Catch  Result Value Ref Range Status   Specimen Description   Final    URINE, CLEAN CATCH Performed at [facility], [address], [place], [place] [address]    Special Requests   Final    NONE Performed at [facility],  [address] [place], [place] [address]    Culture >=100,000 COLONIES/mL PROTEUS MIRABILIS (A)  Final   Report Status 10/06/2022 FINAL  Final   Organism ID, Bacteria PROTEUS MIRABILIS (A)  Final      Susceptibility   Proteus mirabilis - MIC*    AMPICILLIN >=32 RESISTANT Resistant     CEFAZOLIN <=4 SENSITIVE Sensitive     CEFEPIME <=0.12 SENSITIVE Sensitive  CEFTRIAXONE <=0.25 SENSITIVE Sensitive     CIPROFLOXACIN >=4 RESISTANT Resistant     GENTAMICIN <=1 SENSITIVE Sensitive     IMIPENEM 4 SENSITIVE Sensitive     NITROFURANTOIN 128 RESISTANT Resistant     TRIMETH/SULFA >=320 RESISTANT Resistant     AMPICILLIN/SULBACTAM 8 SENSITIVE Sensitive     PIP/TAZO <=4 SENSITIVE Sensitive     * >=100,000 COLONIES/mL PROTEUS MIRABILIS    Procedures and diagnostic studies:  No results found.             LOS: 5 days   Lari Linson  [facility]   Pager on [email or web address]. If 7PM-7AM, please contact night-coverage at [email or web address]     10/08/2022, 5:42 PM

## 2022-10-09 DIAGNOSIS — N3001 Acute cystitis with hematuria: Secondary | ICD-10-CM | POA: Diagnosis not present

## 2022-10-09 LAB — GLUCOSE, CAPILLARY: Glucose-Capillary: 131 mg/dL — ABNORMAL HIGH (ref 70–99)

## 2022-10-09 NOTE — Progress Notes (Signed)
Mobility Specialist - Progress Note   10/09/22 1447  Mobility  Activity Stood at bedside;Dangled on edge of bed;Ambulated with assistance in room  Level of Assistance Contact guard assist, steadying assist  Assistive Device Front wheel walker  Distance Ambulated (ft) 10 ft  Activity Response Tolerated well  Mobility Referral Yes  $Mobility charge 1 Mobility  Mobility Specialist Start Time (ACUTE ONLY) 1411  Mobility Specialist Stop Time (ACUTE ONLY) 1424  Mobility Specialist Time Calculation (min) (ACUTE ONLY) 13 min   Author responds to bed alarm. Pt EOB on RA upon arrival. Pt STS and ambulates to/from door CGA with VC throughout for safety awareness. Pt returns to bed with needs in reach and bed alarm activated.   Terrilyn Saver  Mobility Specialist  10/09/22 2:49 PM

## 2022-10-09 NOTE — Progress Notes (Addendum)
Progress Note    Kevin Bradshaw  UJW:[id number] DOB: [birth year]  DOA: 10/03/2022 PCP: Gracelyn Nurse, MD      Brief Narrative:    Medical records reviewed and are as summarized below:  Kevin Bradshaw is a 87 y.o. male with medical history significant of chronic A-fib not on anticoagulation due to frequent falls, HTN, HLD, Parkinson's disease with chronic ambulation impairment and frequent falls, multiple CVAs since 2019 on aspirin and Plavix, brought in by family member for evaluation of confusion.        Assessment/Plan:   Principal Problem:   UTI (urinary tract infection) Active Problems:   Atrial fibrillation with RVR (HCC)   Acute metabolic encephalopathy   History of stroke    Body mass index is 24.76 kg/m.   Acute UTI: Urine culture showed Proteus mirabilis.  Completed 5 days of antibiotics (Initially treated with IV ceftriaxone followed by Keflex).   Acute metabolic encephalopathy: Resolved.  Likely due to acute UTI.   No acute stroke on MRI brain but subacute to chronic infarct suspected in the left parietal area.   History of stroke, Parkinson's disease, dementia, frequent falls: PT recommended discharge to SNF.  Awaiting placement.   Permanent atrial fibrillation: Not on chronic anticoagulation because of high fall risk.  He is on aspirin and Plavix.   Other comorbidities include hypertension, BPH   Insurance authorization have been received for discharge to SNF but SNF cannot take patient until Monday, 10/11/2022.   Diet Order             Diet Heart Fluid consistency: Thin  Diet effective now                            Consultants: None  Procedures: None    Medications:    aspirin EC  81 mg Oral Daily   atorvastatin  80 mg Oral q1800   clopidogrel  75 mg Oral Daily   enoxaparin (LOVENOX) injection  40 mg Subcutaneous QHS   feeding supplement  237 mL Oral TID BM   finasteride  5 mg Oral Daily   metoprolol  tartrate  25 mg Oral BID   tamsulosin  0.4 mg Oral Daily   Continuous Infusions:     Anti-infectives (From admission, onward)    Start     Dose/Rate Route Frequency Ordered Stop   10/07/22 0600  cephALEXin (KEFLEX) capsule 500 mg        500 mg Oral Every 8 hours 10/06/22 1616 10/07/22 2231   10/03/22 0915  cefTRIAXone (ROCEPHIN) 2 g in sodium chloride 0.9 % 100 mL IVPB  Status:  Discontinued        2 g 200 mL/hr over 30 Minutes Intravenous Every 24 hours 10/03/22 0913 10/06/22 1616              Family Communication/Anticipated D/C date and plan/Code Status   DVT prophylaxis: enoxaparin (LOVENOX) injection 40 mg Start: 10/03/22 2200     Code Status: Limited: Do not attempt resuscitation (DNR) -DNR-LIMITED -Do Not Intubate/DNI   Family Communication: None Disposition Plan: Plan to discharge to SNF   Status is: Inpatient Remains inpatient appropriate because: Awaiting placement to SNF.  Insurance authorization is pending.       Subjective:   Interval events noted.  He has no complaints.  Abdominal pain, vomiting, shortness of breath or chest pain  Objective:    Vitals:   10/08/22 1628  10/08/22 2043 10/09/22 0513 10/09/22 0807  BP: 119/76 92/72 114/65 109/88  Pulse: 82 77 76 84  Resp: 19 19 20 16   Temp: 98.8 F (37.1 C) 98.3 F (36.8 C) 97.8 F (36.6 C) 98.4 F (36.9 C)  TempSrc: Oral     SpO2: 97% 99% 96% 96%  Weight:      Height:       No data found.  No intake or output data in the 24 hours ending 10/09/22 1253   Filed Weights   10/03/22 0731 10/03/22 1750  Weight: 72.6 kg 63.4 kg    Exam:  GEN: NAD SKIN: Warm and dry EYES: No pallor or icterus ENT: MMM CV: RRR PULM: CTA B ABD: soft, ND, NT, +BS CNS: AAO x 2 (person and place), non focal EXT: No edema or tenderness       Data Reviewed:   I have personally reviewed following labs and imaging studies:  Labs: Labs show the following:   Basic Metabolic Panel: Recent Labs   Lab 10/03/22 0750  NA 140  K 4.2  CL 106  CO2 27  GLUCOSE 98  BUN 18  CREATININE 1.01  CALCIUM 9.3   GFR Estimated Creatinine Clearance: 43 mL/min (by C-G formula based on SCr of 1.01 mg/dL). Liver Function Tests: Recent Labs  Lab 10/03/22 0750  AST 30  ALT 20  ALKPHOS 77  BILITOT 1.3*  PROT 7.5  ALBUMIN 3.7   No results for input(s): "LIPASE", "AMYLASE" in the last 168 hours. No results for input(s): "AMMONIA" in the last 168 hours. Coagulation profile No results for input(s): "INR", "PROTIME" in the last 168 hours.  CBC: Recent Labs  Lab 10/03/22 0750  WBC 5.1  NEUTROABS 3.3  HGB 14.7  HCT 45.6  MCV 95.4  PLT 207   Cardiac Enzymes: No results for input(s): "CKTOTAL", "CKMB", "CKMBINDEX", "TROPONINI" in the last 168 hours. BNP (last 3 results) No results for input(s): "PROBNP" in the last 8760 hours. CBG: No results for input(s): "GLUCAP" in the last 168 hours. D-Dimer: No results for input(s): "DDIMER" in the last 72 hours. Hgb A1c: No results for input(s): "HGBA1C" in the last 72 hours. Lipid Profile: No results for input(s): "CHOL", "HDL", "LDLCALC", "TRIG", "CHOLHDL", "LDLDIRECT" in the last 72 hours.  Thyroid function studies: No results for input(s): "TSH", "T4TOTAL", "T3FREE", "THYROIDAB" in the last 72 hours.  Invalid input(s): "FREET3" Anemia work up: No results for input(s): "VITAMINB12", "FOLATE", "FERRITIN", "TIBC", "IRON", "RETICCTPCT" in the last 72 hours. Sepsis Labs: Recent Labs  Lab 10/03/22 0750  WBC 5.1    Microbiology Recent Results (from the past 240 hour(s))  Urine Culture (for pregnant, neutropenic or urologic patients or patients with an indwelling urinary catheter)     Status: Abnormal   Collection Time: 10/04/22 11:00 AM   Specimen: Urine, Clean Catch  Result Value Ref Range Status   Specimen Description   Final    URINE, CLEAN CATCH Performed at [facility], [address] [place], [place] [address]     Special Requests   Final    NONE Performed at [facility], [address] [place], [place] [address]    Culture >=100,000 COLONIES/mL PROTEUS MIRABILIS (A)  Final   Report Status 10/06/2022 FINAL  Final   Organism ID, Bacteria PROTEUS MIRABILIS (A)  Final      Susceptibility   Proteus mirabilis - MIC*    AMPICILLIN >=32 RESISTANT Resistant     CEFAZOLIN <=4 SENSITIVE Sensitive  CEFEPIME <=0.12 SENSITIVE Sensitive     CEFTRIAXONE <=0.25 SENSITIVE Sensitive     CIPROFLOXACIN >=4 RESISTANT Resistant     GENTAMICIN <=1 SENSITIVE Sensitive     IMIPENEM 4 SENSITIVE Sensitive     NITROFURANTOIN 128 RESISTANT Resistant     TRIMETH/SULFA >=320 RESISTANT Resistant     AMPICILLIN/SULBACTAM 8 SENSITIVE Sensitive     PIP/TAZO <=4 SENSITIVE Sensitive     * >=100,000 COLONIES/mL PROTEUS MIRABILIS    Procedures and diagnostic studies:  No results found.             LOS: 6 days   Kevin Bradshaw  [facility]   Pager on [email or web address]. If 7PM-7AM, please contact night-coverage at [email or web address]     10/09/2022, 12:53 PM

## 2022-10-10 DIAGNOSIS — N3001 Acute cystitis with hematuria: Secondary | ICD-10-CM | POA: Diagnosis not present

## 2022-10-10 LAB — CBC
HCT: 41 % (ref 39.0–52.0)
Hemoglobin: 13.7 g/dL (ref 13.0–17.0)
MCH: 30.5 pg (ref 26.0–34.0)
MCHC: 33.4 g/dL (ref 30.0–36.0)
MCV: 91.3 fL (ref 80.0–100.0)
Platelets: 207 10*3/uL (ref 150–400)
RBC: 4.49 MIL/uL (ref 4.22–5.81)
RDW: 13.2 % (ref 11.5–15.5)
WBC: 6.2 10*3/uL (ref 4.0–10.5)
nRBC: 0 % (ref 0.0–0.2)

## 2022-10-10 LAB — BASIC METABOLIC PANEL
Anion gap: 7 (ref 5–15)
BUN: 24 mg/dL — ABNORMAL HIGH (ref 8–23)
CO2: 27 mmol/L (ref 22–32)
Calcium: 9 mg/dL (ref 8.9–10.3)
Chloride: 105 mmol/L (ref 98–111)
Creatinine, Ser: 0.82 mg/dL (ref 0.61–1.24)
GFR, Estimated: >60 mL/min (ref >60)
Glucose, Bld: 105 mg/dL — ABNORMAL HIGH (ref 70–99)
Potassium: 3.8 mmol/L (ref 3.5–5.1)
Sodium: 139 mmol/L (ref 135–145)

## 2022-10-10 MED ORDER — LOPERAMIDE HCL 2 MG PO CAPS: 4.0000 mg | ORAL_CAPSULE | Freq: Once | ORAL | Status: DC

## 2022-10-10 NOTE — Progress Notes (Signed)
Progress Note    Kevin Bradshaw  QIO:[id number] DOB: 04/21/1935  DOA: 10/03/2022 PCP: Gracelyn Nurse, MD      Brief Narrative:    Medical records reviewed and are as summarized below:  Kevin Bradshaw is a 87 y.o. male with medical history significant of chronic A-fib not on anticoagulation due to frequent falls, HTN, HLD, Parkinson's disease with chronic ambulation impairment and frequent falls, multiple CVAs since 2019 on aspirin and Plavix, brought in by family member for evaluation of confusion.        Assessment/Plan:   Principal Problem:   UTI (urinary tract infection) Active Problems:   Atrial fibrillation with RVR (HCC)   Acute metabolic encephalopathy   History of stroke    Body mass index is 24.76 kg/m.   Acute UTI: Urine culture showed Proteus mirabilis.  Completed 5 days of antibiotics (Initially treated with IV ceftriaxone followed by Keflex).   Acute metabolic encephalopathy: Resolved.  Likely due to acute UTI.   No acute stroke on MRI brain but subacute to chronic infarct suspected in the left parietal area.   History of stroke, Parkinson's disease, dementia, frequent falls: PT recommended discharge to SNF.  Awaiting placement.   Permanent atrial fibrillation: Not on chronic anticoagulation because of high fall risk.  Continue aspirin and Plavix   Other comorbidities include hypertension, BPH   Plan to discharge to SNF tomorrow   Diet Order             Diet Heart Fluid consistency: Thin  Diet effective now                            Consultants: None  Procedures: None    Medications:    aspirin EC  81 mg Oral Daily   atorvastatin  80 mg Oral q1800   clopidogrel  75 mg Oral Daily   enoxaparin (LOVENOX) injection  40 mg Subcutaneous QHS   feeding supplement  237 mL Oral TID BM   finasteride  5 mg Oral Daily   metoprolol tartrate  25 mg Oral BID   tamsulosin  0.4 mg Oral Daily   Continuous  Infusions:     Anti-infectives (From admission, onward)    Start     Dose/Rate Route Frequency Ordered Stop   10/07/22 0600  cephALEXin (KEFLEX) capsule 500 mg        500 mg Oral Every 8 hours 10/06/22 1616 10/07/22 2231   10/03/22 0915  cefTRIAXone (ROCEPHIN) 2 g in sodium chloride 0.9 % 100 mL IVPB  Status:  Discontinued        2 g 200 mL/hr over 30 Minutes Intravenous Every 24 hours 10/03/22 0913 10/06/22 1616              Family Communication/Anticipated D/C date and plan/Code Status   DVT prophylaxis: enoxaparin (LOVENOX) injection 40 mg Start: 10/03/22 2200     Code Status: Limited: Do not attempt resuscitation (DNR) -DNR-LIMITED -Do Not Intubate/DNI   Family Communication: None Disposition Plan: Plan to discharge to SNF   Status is: Inpatient Remains inpatient appropriate because: Awaiting placement to SNF.  Insurance authorization is pending.       Subjective:   Interval events noted.  He has no complaints.  No shortness of breath or chest pain.  Objective:    Vitals:   10/09/22 1600 10/09/22 2055 10/10/22 0458 10/10/22 0838  BP: (!) 141/92 123/69 (!) 146/82 105/66  Pulse: 72 100 80 88  Resp: 20 16 16 17   Temp: 97.8 F (36.6 C) 97.7 F (36.5 C) 97.8 F (36.6 C) (!) 97.5 F (36.4 C)  TempSrc:      SpO2: 98% (!) 87% 96% 99%  Weight:      Height:       No data found.   Intake/Output Summary (Last 24 hours) at 10/10/2022 1503 Last data filed at 10/10/2022 1231 Gross per 24 hour  Intake 480 ml  Output --  Net 480 ml     Filed Weights   10/03/22 0731 10/03/22 1750  Weight: 72.6 kg 63.4 kg    Exam:  GEN: NAD, sitting up in the chair SKIN: Warm and dry EYES: Anicteric ENT: MMM CV: RRR PULM: CTA B ABD: soft, ND, NT, +BS CNS: AAO x 2 (person and place), non focal EXT: No edema or tenderness      Data Reviewed:   I have personally reviewed following labs and imaging studies:  Labs: Labs show the following:   Basic  Metabolic Panel: Recent Labs  Lab 10/10/22 0456  NA 139  K 3.8  CL 105  CO2 27  GLUCOSE 105*  BUN 24*  CREATININE 0.82  CALCIUM 9.0   GFR Estimated Creatinine Clearance: 53 mL/min (by C-G formula based on SCr of 0.82 mg/dL). Liver Function Tests: No results for input(s): "AST", "ALT", "ALKPHOS", "BILITOT", "PROT", "ALBUMIN" in the last 168 hours.  No results for input(s): "LIPASE", "AMYLASE" in the last 168 hours. No results for input(s): "AMMONIA" in the last 168 hours. Coagulation profile No results for input(s): "INR", "PROTIME" in the last 168 hours.  CBC: Recent Labs  Lab 10/10/22 0456  WBC 6.2  HGB 13.7  HCT 41.0  MCV 91.3  PLT 207   Cardiac Enzymes: No results for input(s): "CKTOTAL", "CKMB", "CKMBINDEX", "TROPONINI" in the last 168 hours. BNP (last 3 results) No results for input(s): "PROBNP" in the last 8760 hours. CBG: Recent Labs  Lab 10/09/22 1604  GLUCAP 131*   D-Dimer: No results for input(s): "DDIMER" in the last 72 hours. Hgb A1c: No results for input(s): "HGBA1C" in the last 72 hours. Lipid Profile: No results for input(s): "CHOL", "HDL", "LDLCALC", "TRIG", "CHOLHDL", "LDLDIRECT" in the last 72 hours.  Thyroid function studies: No results for input(s): "TSH", "T4TOTAL", "T3FREE", "THYROIDAB" in the last 72 hours.  Invalid input(s): "FREET3" Anemia work up: No results for input(s): "VITAMINB12", "FOLATE", "FERRITIN", "TIBC", "IRON", "RETICCTPCT" in the last 72 hours. Sepsis Labs: Recent Labs  Lab 10/10/22 0456  WBC 6.2    Microbiology Recent Results (from the past 240 hour(s))  Urine Culture (for pregnant, neutropenic or urologic patients or patients with an indwelling urinary catheter)     Status: Abnormal   Collection Time: 10/04/22 11:00 AM   Specimen: Urine, Clean Catch  Result Value Ref Range Status   Specimen Description   Final    URINE, CLEAN CATCH Performed at [facility], [address] [place], [place]  [address]    Special Requests   Final    NONE Performed at [facility], [address] [place], [place] [address]    Culture >=100,000 COLONIES/mL PROTEUS MIRABILIS (A)  Final   Report Status 10/06/2022 FINAL  Final   Organism ID, Bacteria PROTEUS MIRABILIS (A)  Final      Susceptibility   Proteus mirabilis - MIC*    AMPICILLIN >=32 RESISTANT Resistant     CEFAZOLIN <=4 SENSITIVE Sensitive  CEFEPIME <=0.12 SENSITIVE Sensitive     CEFTRIAXONE <=0.25 SENSITIVE Sensitive     CIPROFLOXACIN >=4 RESISTANT Resistant     GENTAMICIN <=1 SENSITIVE Sensitive     IMIPENEM 4 SENSITIVE Sensitive     NITROFURANTOIN 128 RESISTANT Resistant     TRIMETH/SULFA >=320 RESISTANT Resistant     AMPICILLIN/SULBACTAM 8 SENSITIVE Sensitive     PIP/TAZO <=4 SENSITIVE Sensitive     * >=100,000 COLONIES/mL PROTEUS MIRABILIS    Procedures and diagnostic studies:  No results found.             LOS: 7 days   Beauford Lando  [facility]   Pager on [email or web address]. If 7PM-7AM, please contact night-coverage at [email or web address]     10/10/2022, 3:03 PM

## 2022-10-11 DIAGNOSIS — N3001 Acute cystitis with hematuria: Secondary | ICD-10-CM | POA: Diagnosis not present

## 2022-10-11 NOTE — Discharge Summary (Signed)
Physician Discharge Summary   Patient: Kevin Bradshaw MRN: [id number] DOB: [birth year]  Admit date:     10/03/2022  Discharge date: 10/11/22  Discharge Physician: Lurene Shadow   PCP: Gracelyn Nurse, MD   Recommendations at discharge:   Follow-up with physician at the nursing home within 3 days of discharge  Discharge Diagnoses: Principal Problem:   UTI (urinary tract infection) Active Problems:   Atrial fibrillation with RVR (HCC)   Acute metabolic encephalopathy   History of stroke  Resolved Problems:   * No resolved hospital problems. *  Hospital Course:  Kevin Bradshaw is a 87 y.o. male with medical history significant of chronic A-fib not on anticoagulation due to frequent falls, HTN, HLD, Parkinson's disease with chronic ambulation impairment and frequent falls, multiple CVAs since 2019 on aspirin and Plavix, brought in by family member for evaluation of confusion.    He was admitted to the hospital for acute UTI complicated by acute metabolic encephalopathy.  He was treated with IV fluids and empiric IV antibiotics.   Assessment and Plan:  Acute UTI: Urine culture showed Proteus mirabilis.  Completed 5 days of antibiotics (Initially treated with IV ceftriaxone followed by Keflex).     Acute metabolic encephalopathy: Resolved.  Likely due to acute UTI.   No acute stroke on MRI brain but subacute to chronic infarct suspected in the left parietal area.     History of stroke, Parkinson's disease, dementia, frequent falls: PT recommended discharge to SNF.  Awaiting placement.     Permanent atrial fibrillation: Not on chronic anticoagulation because of high fall risk.  Continue aspirin and Plavix     Other comorbidities include hypertension, BPH   His condition has improved and he is deemed stable for discharge today.       Consultants: None Procedures performed: None  Disposition: Skilled nursing facility Diet recommendation:  Cardiac diet DISCHARGE  MEDICATION: Allergies as of 10/11/2022   No Known Allergies      Medication List     STOP taking these medications    ezetimibe 10 MG tablet Commonly known as: ZETIA   ferrous sulfate 325 (65 FE) MG EC tablet   multivitamin with minerals Tabs tablet   pantoprazole 40 MG tablet Commonly known as: PROTONIX   Potassium Chloride ER 20 MEQ Tbcr       TAKE these medications    acetaminophen 325 MG tablet Commonly known as: TYLENOL Take 2 tablets (650 mg total) by mouth every 6 (six) hours as needed for mild pain (or Fever >/= 101).   aspirin 81 MG tablet Take 81 mg by mouth daily.   atorvastatin 80 MG tablet Commonly known as: LIPITOR Take 1 tablet (80 mg total) by mouth daily at 6 PM.   clopidogrel 75 MG tablet Commonly known as: PLAVIX Take 1 tablet (75 mg total) by mouth daily.   feeding supplement Liqd Take 237 mLs by mouth 3 (three) times daily between meals.   finasteride 5 MG tablet Commonly known as: PROSCAR Take 5 mg by mouth daily.   metoprolol succinate 50 MG 24 hr tablet Commonly known as: TOPROL-XL Take 50 mg by mouth daily. What changed: Another medication with the same name was removed. Continue taking this medication, and follow the directions you see here.   senna-docusate 8.6-50 MG tablet Commonly known as: Senokot-S Take 1 tablet by mouth at bedtime as needed for mild constipation.   tamsulosin 0.4 MG Caps capsule Commonly known as: FLOMAX Take 0.4 mg by  mouth daily.        Contact information for after-discharge care     Destination     [facility] Preferred SNF .   Service: Skilled Nursing Contact information: [address] [place] [address] [phone]                    Discharge Exam: Ceasar Mons Weights   10/03/22 0731 10/03/22 1750  Weight: 72.6 kg 63.4 kg   GEN: NAD SKIN: Warm and dry EYES: No pallor or  icterus ENT: MMM CV: RRR PULM: CTA B ABD: soft, ND, NT, +BS CNS: AAO x 2 (person and place), non focal EXT: No edema or tenderness   Condition at discharge: good  The results of significant diagnostics from this hospitalization (including imaging, microbiology, ancillary and laboratory) are listed below for reference.   Imaging Studies: MR BRAIN WO CONTRAST  Result Date: 10/03/2022 CLINICAL DATA:  Stroke follow-up EXAM: MRI HEAD WITHOUT CONTRAST TECHNIQUE: Multiplanar, multiecho pulse sequences of the brain and surrounding structures were obtained without intravenous contrast. COMPARISON:  Head CT from earlier today FINDINGS: Brain: The interval left parietal infarct highlighted on prior head CT has features of late subacute to chronic timing by diffusion imaging with neutral to loss of volume and cortical laminar necrosis. Chronic right frontal and parietooccipital infarcts. Brain atrophy with ventriculomegaly. Chronic blood products at sites of prior infarction. No mass, obstructive hydrocephalus, or collection Vascular: Major flow voids are preserved Skull and upper cervical spine: No focal marrow lesion. Advanced cervical spine degeneration with erosive changes of the dens where there is calcification, CPPD arthropathy could have this pattern. Nodular scarring in the suboccipital scalp. Sinuses/Orbits: Mild ethmoid sinus opacification. IMPRESSION: 1. The left parietal infarct highlighted on prior head CT is late subacute to chronic. 2. Brain atrophy and chronic right cerebral infarcts. Electronically Signed   By: Tiburcio Pea M.D.   On: 10/03/2022 12:16   CT Head Wo Contrast  Result Date: 10/03/2022 CLINICAL DATA:  Fall with arm abrasions. EXAM: CT HEAD WITHOUT CONTRAST CT CERVICAL SPINE WITHOUT CONTRAST TECHNIQUE: Multidetector CT imaging of the head and cervical spine was performed following the standard protocol without intravenous contrast. Multiplanar CT image reconstructions of the  cervical spine were also generated. RADIATION DOSE REDUCTION: This exam was performed according to the departmental dose-optimization program which includes automated exposure control, adjustment of the mA and/or kV according to patient size and/or use of iterative reconstruction technique. COMPARISON:  04/15/2022 head CT. FINDINGS: CT HEAD FINDINGS Brain: Cytotoxic edema in the left parietal lobe involving a moderate area, interval and recent appearing. Chronic right frontal parietal cortex infarct. Expected evolution of small superior right frontal cortex infarct seen on prior. Cerebral volume loss with ventriculomegaly. There is disproportionate subarachnoid spaces although the degree of ventriculomegaly is fairly congruous with a degree of atrophy. Vascular: No hyperdense vessel or unexpected calcification. Skull: Normal. Negative for fracture or focal lesion. Sinuses/Orbits: Negative CT CERVICAL SPINE FINDINGS Alignment: No traumatic malalignment Skull base and vertebrae: No acute fracture Soft tissues and spinal canal: No prevertebral fluid or swelling. No visible canal hematoma. Suboccipital scarring. Disc levels: Advanced and diffuse degenerative spurring. 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 Upper chest: No acute finding Other: IMPRESSION: Head CT: 1. Acute or subacute left parietal infarct involving a moderate area. 2. Remote right MCA branch infarcts. 3. No evidence of intracranial injury. Cervical spine: Negative for fracture or subluxation. Electronically Signed  By: Tiburcio Pea M.D.   On: 10/03/2022 08:54   CT Cervical Spine Wo Contrast  Result Date: 10/03/2022 CLINICAL DATA:  Fall with arm abrasions. EXAM: CT HEAD WITHOUT CONTRAST CT CERVICAL SPINE WITHOUT CONTRAST TECHNIQUE: Multidetector CT imaging of the head and cervical spine was performed following the standard protocol without intravenous contrast. Multiplanar CT image reconstructions of the cervical spine were also  generated. RADIATION DOSE REDUCTION: This exam was performed according to the departmental dose-optimization program which includes automated exposure control, adjustment of the mA and/or kV according to patient size and/or use of iterative reconstruction technique. COMPARISON:  04/15/2022 head CT. FINDINGS: CT HEAD FINDINGS Brain: Cytotoxic edema in the left parietal lobe involving a moderate area, interval and recent appearing. Chronic right frontal parietal cortex infarct. Expected evolution of small superior right frontal cortex infarct seen on prior. Cerebral volume loss with ventriculomegaly. There is disproportionate subarachnoid spaces although the degree of ventriculomegaly is fairly congruous with a degree of atrophy. Vascular: No hyperdense vessel or unexpected calcification. Skull: Normal. Negative for fracture or focal lesion. Sinuses/Orbits: Negative CT CERVICAL SPINE FINDINGS Alignment: No traumatic malalignment Skull base and vertebrae: No acute fracture Soft tissues and spinal canal: No prevertebral fluid or swelling. No visible canal hematoma. Suboccipital scarring. Disc levels: Advanced and diffuse degenerative spurring. 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 Upper chest: No acute finding Other: IMPRESSION: Head CT: 1. Acute or subacute left parietal infarct involving a moderate area. 2. Remote right MCA branch infarcts. 3. No evidence of intracranial injury. Cervical spine: Negative for fracture or subluxation. Electronically Signed   By: Tiburcio Pea M.D.   On: 10/03/2022 08:54    Microbiology: Results for orders placed or performed during the hospital encounter of 10/03/22  Urine Culture (for pregnant, neutropenic or urologic patients or patients with an indwelling urinary catheter)     Status: Abnormal   Collection Time: 10/04/22 11:00 AM   Specimen: Urine, Clean Catch  Result Value Ref Range Status   Specimen Description   Final    URINE, CLEAN CATCH Performed at  [facility], [address] [place], [place] [address]    Special Requests   Final    NONE Performed at [facility], [address] [place], [place] [address]    Culture >=100,000 COLONIES/mL PROTEUS MIRABILIS (A)  Final   Report Status 10/06/2022 FINAL  Final   Organism ID, Bacteria PROTEUS MIRABILIS (A)  Final      Susceptibility   Proteus mirabilis - MIC*    AMPICILLIN >=32 RESISTANT Resistant     CEFAZOLIN <=4 SENSITIVE Sensitive     CEFEPIME <=0.12 SENSITIVE Sensitive     CEFTRIAXONE <=0.25 SENSITIVE Sensitive     CIPROFLOXACIN >=4 RESISTANT Resistant     GENTAMICIN <=1 SENSITIVE Sensitive     IMIPENEM 4 SENSITIVE Sensitive     NITROFURANTOIN 128 RESISTANT Resistant     TRIMETH/SULFA >=320 RESISTANT Resistant     AMPICILLIN/SULBACTAM 8 SENSITIVE Sensitive     PIP/TAZO <=4 SENSITIVE Sensitive     * >=100,000 COLONIES/mL PROTEUS MIRABILIS    Labs: CBC: Recent Labs  Lab 10/10/22 0456  WBC 6.2  HGB 13.7  HCT 41.0  MCV 91.3  PLT 207   Basic Metabolic Panel: Recent Labs  Lab 10/10/22 0456  NA 139  K 3.8  CL 105  CO2 27  GLUCOSE 105*  BUN 24*  CREATININE 0.82  CALCIUM 9.0   Liver Function Tests: No results for input(s): "AST", "ALT", "ALKPHOS", "BILITOT", "  PROT", "ALBUMIN" in the last 168 hours. CBG: Recent Labs  Lab 10/09/22 1604  GLUCAP 131*    Discharge time spent: greater than 30 minutes.  Signed: Lurene Shadow, MD [facility] 10/11/2022

## 2022-10-11 NOTE — TOC Transition Note (Signed)
Transition of Care [facility]) - CM/SW Discharge Note   Patient Details  Name: Kevin Bradshaw MRN: [id number] Date of Birth: June 29, 1935  [facility] of [facility] CM/SW Contact:  Garret Reddish, RN Phone Number: 10/11/2022, 11:26 AM   Clinical Narrative:    Chart reviewed.  Noted that patient has orders for discharge today.    Noted that patient and her family has accepted bed offer at [facility].   I have spoken with Tiffany with [facility].  She informs me that she will have a room for the patient today.  She informs me that the number to call report is [phone] and patient will be going to room number 601.    I have informed Mrs. Franky Macho patient's daughter that [facility] will have a bed for patient today.  I have informed Mrs. Doristine Mango that [facility] will transport patient to the facility today.    I have arranged for [facility] to transport patient to the facility today.    I have informed staff nurse of the above information.      Final next level of care: Skilled Nursing Facility Barriers to Discharge: No Barriers Identified   Patient Goals and CMS Choice CMS Medicare.gov Compare Post Acute Care list provided to:: Patient Represenative (must comment) (Patient's daughter) Choice offered to / list presented to : Adult Children  Discharge Placement                Patient chooses bed at: [facility] Patient to be transferred to facility by: [facility] Name of family member notified: Franky Macho  (Patient's daughter) Patient and family notified of of transfer: 10/11/22  Discharge Plan and Services Additional resources added to the After Visit Summary for       Post Acute Care Choice: Durable Medical Equipment                               Social Determinants of Health (SDOH) Interventions SDOH Screenings   Tobacco Use: Medium Risk (10/03/2022)     Readmission Risk Interventions     No data  to display

## 2022-10-11 NOTE — Progress Notes (Signed)
Mobility Specialist - Progress Note   10/11/22 0950  Mobility  Activity Ambulated with assistance in room  Level of Assistance Contact guard assist, steadying assist  Assistive Device Front wheel walker  Distance Ambulated (ft) 24 ft  Range of Motion/Exercises Active;Right leg;Left leg  Activity Response Tolerated well  $Mobility charge 1 Mobility  Mobility Specialist Start Time (ACUTE ONLY) [id number]  Mobility Specialist Stop Time (ACUTE ONLY) 0949  Mobility Specialist Time Calculation (min) (ACUTE ONLY) 20 min   Pt sitting in the recliner upon entry, utilizing RA. Pt agreeable to amb within the room this date. Pt completed seated therex including; BLE marches, leg raises, leg kicks, ankle pumps and circles. Pt STS to RW MinA, amb to the door within room CGA requiring extra time with step progression. Pt required tactile and vocal cueing for sequencing, body position within the RW and navigation of the RW. Pt returned to the recliner, left seated with alarm set and needs within reach. RN notified.  Zetta Bills Mobility Specialist 10/11/22 9:55 AM

## 2022-10-11 NOTE — Care Management Important Message (Signed)
Important Message  Patient Details  Name: Kevin Bradshaw MRN: [id number] Date of Birth: 12-Jul-1935   Important Message Given:  Yes - Medicare IM     Olegario Messier A Cady Hafen 10/11/2022, 9:18 AM

## 2022-10-11 NOTE — Progress Notes (Signed)
Pt left [facility] to [facility] Room # 601. Report was called to [phone] andi Spoke to the oncoming  Nurse Amy.

## 2022-10-11 NOTE — TOC Progression Note (Signed)
Transition of Care [facility]) - Progression Note    Patient Details  Name: Kevin Bradshaw MRN: [id number] Date of Birth: 1935-02-03  [facility]) CM/SW Contact  Garret Reddish, RN Phone Number: 10/11/2022, 9:09 AM  Clinical Narrative:     Patient has been approved for SNF.  Auth Certification is [id number].  I have made Admission Coordinator at [facility] aware and awaiting follow up in regards to report number and room number.    TOC will continue to follow for discharge planning.     Expected Discharge Plan: Assisted Living Barriers to Discharge: Continued Medical Work up  Expected Discharge Plan and Services     Post Acute Care Choice: Durable Medical Equipment Living arrangements for the past 2 months: Assisted Living Facility Expected Discharge Date: 10/11/22                                     Social Determinants of Health (SDOH) Interventions SDOH Screenings   Tobacco Use: Medium Risk (10/03/2022)    Readmission Risk Interventions     No data to display

## 2022-11-21 ENCOUNTER — Emergency Department: Payer: Medicare HMO

## 2022-11-21 ENCOUNTER — Other Ambulatory Visit: Payer: Self-pay

## 2022-11-21 ENCOUNTER — Emergency Department
Admission: EM | Admit: 2022-11-21 | Discharge: 2022-11-21 | Disposition: A | Discharge status: Home / Self Care | Payer: Medicare HMO | Attending: Emergency Medicine | Admitting: Emergency Medicine

## 2022-11-21 DIAGNOSIS — W1830XA Fall on same level, unspecified, initial encounter: Secondary | ICD-10-CM | POA: Diagnosis not present

## 2022-11-21 DIAGNOSIS — W19XXXA Unspecified fall, initial encounter: Secondary | ICD-10-CM

## 2022-11-21 DIAGNOSIS — Z8673 Personal history of transient ischemic attack (TIA), and cerebral infarction without residual deficits: Secondary | ICD-10-CM | POA: Diagnosis not present

## 2022-11-21 DIAGNOSIS — G20C Parkinsonism, unspecified: Secondary | ICD-10-CM | POA: Insufficient documentation

## 2022-11-21 DIAGNOSIS — S0990XA Unspecified injury of head, initial encounter: Secondary | ICD-10-CM | POA: Diagnosis present

## 2022-11-21 DIAGNOSIS — S0121XA Laceration without foreign body of nose, initial encounter: Secondary | ICD-10-CM | POA: Diagnosis not present

## 2022-11-21 DIAGNOSIS — F0390 Unspecified dementia without behavioral disturbance: Secondary | ICD-10-CM | POA: Insufficient documentation

## 2022-11-21 DIAGNOSIS — S0181XA Laceration without foreign body of other part of head, initial encounter: Secondary | ICD-10-CM

## 2022-11-21 MED ORDER — LIDOCAINE HCL (PF) 1 % IJ SOLN
5.0000 mL | Freq: Once | INTRAMUSCULAR | Status: AC
  Administered 2022-11-21: 5 mL via INTRADERMAL
  Filled 2022-11-21: qty 5

## 2022-11-21 NOTE — ED Notes (Signed)
ED Provider at bedside. 

## 2022-11-21 NOTE — ED Triage Notes (Signed)
Pt in via EMS from [facility]. Per EMS, staff reports he was looking for something in the trash can and face planted. Pt takes blood thinners, no LOC. Fall was witnessed. Pt has a crossed shaped lac to his forehead.

## 2022-11-21 NOTE — ED Provider Notes (Addendum)
[facility] Provider Note    Event Date/Time   First MD Initiated Contact with Patient 11/21/22 1104     (approximate)   History   Fall   HPI  Kevin Bradshaw is a 87 y.o. male past medical history significant for atrial fibrillation, frequent falls, Parkinson's disease, prior history of CVA, dementia, who presents to the emergency department following a fall.  History is provided by the patient's daughter at bedside.  States that he was at his facility today and looking through the trash where he fell forward hitting his head.  States that he is at his mental status baseline at this time.  Believes that his tetanus is up-to-date.  Patient without complaints at this time.     Physical Exam   Triage Vital Signs: ED Triage Vitals  Encounter Vitals Group     BP 11/21/22 0954 128/78     Systolic BP Percentile --      Diastolic BP Percentile --      Pulse Rate 11/21/22 0954 (!) 109     Resp 11/21/22 0954 18     Temp 11/21/22 0954 97.6 F (36.4 C)     Temp Source 11/21/22 0954 Oral     SpO2 11/21/22 0954 100 %     Weight --      Height --      Head Circumference --      Peak Flow --      Pain Score 11/21/22 0955 4     Pain Loc --      Pain Education --      Exclude from Growth Chart --     Most recent vital signs: Vitals:   11/21/22 0954  BP: 128/78  Pulse: (!) 109  Resp: 18  Temp: 97.6 F (36.4 C)  SpO2: 100%    Physical Exam Constitutional:      Appearance: He is well-developed.  HENT:     Head:     Comments: 2 cm laceration to the nasal bridge/forehead Eyes:     Conjunctiva/sclera: Conjunctivae normal.     Pupils: Pupils are equal, round, and reactive to light.  Cardiovascular:     Rate and Rhythm: Regular rhythm.  Pulmonary:     Effort: No respiratory distress.  Musculoskeletal:     Cervical back: Normal range of motion.  Skin:    General: Skin is warm.  Neurological:     Mental Status: He is alert. Mental status is at  baseline.     Comments: Opens his eyes to voice.  Following simple commands.     IMPRESSION / MDM / ASSESSMENT AND PLAN / ED COURSE  I reviewed the triage vital signs and the nursing notes.  Differential diagnosis including intracranial hemorrhage, laceration, concussion   RADIOLOGY I independently reviewed imaging, my interpretation of imaging: CT scan of the head without signs of intracranial hemorrhage.  Noted prior CVA.  Read as remote bilateral parietal lobe infarcts.  Atrophy.  No signs of a fracture.  LABS (all labs ordered are listed, but only abnormal results are displayed) Labs interpreted as -    Labs Reviewed - No data to display   MDM    Patient with nonsyncopal fall.  CT scans without acute fracture or dislocation.  Patient is at mental status baseline according to daughter at bedside.  Laceration repaired at bedside.  Discharged back to facility with return precautions.   PROCEDURES:  Critical Care performed: No  ..Laceration Repair  Date/Time:  11/21/2022 11:15 AM  Performed by: Corena Herter, MD Authorized by: Corena Herter, MD   Consent:    Consent obtained:  Verbal   Consent given by:  Patient   Risks, benefits, and alternatives were discussed: yes     Risks discussed:  Pain, nerve damage, poor wound healing, poor cosmetic result, vascular damage, tendon damage, infection and need for additional repair   Alternatives discussed:  Delayed treatment and no treatment Universal protocol:    Procedure explained and questions answered to patient or proxy's satisfaction: yes     Relevant documents present and verified: yes     Test results available: yes     Imaging studies available: yes     Required blood products, implants, devices, and special equipment available: yes     Patient identity confirmed:  Verbally with patient Anesthesia:    Anesthesia method:  Local infiltration   Local anesthetic:  Lidocaine 1% w/o epi Laceration details:     Location:  Face   Face location:  Forehead   Length (cm):  2 Pre-procedure details:    Preparation:  Patient was prepped and draped in usual sterile fashion Treatment:    Area cleansed with:  Saline   Amount of cleaning:  Standard Skin repair:    Repair method:  Sutures   Suture size:  5-0   Suture material:  Fast-absorbing gut   Suture technique:  Simple interrupted   Number of sutures:  5 Approximation:    Approximation:  Close Repair type:    Repair type:  Simple Post-procedure details:    Dressing:  Sterile dressing   Procedure completion:  Tolerated well, no immediate complications   Patient's presentation is most consistent with acute presentation with potential threat to life or bodily function.   MEDICATIONS ORDERED IN ED: Medications  lidocaine (PF) (XYLOCAINE) 1 % injection 5 mL (5 mLs Intradermal Given 11/21/22 1129)    FINAL CLINICAL IMPRESSION(S) / ED DIAGNOSES   Final diagnoses:  Fall, initial encounter  Injury of head, initial encounter  Facial laceration, initial encounter     Rx / DC Orders   ED Discharge Orders     None        Note:  This document was prepared using Dragon voice recognition software and may include unintentional dictation errors.   Corena Herter, MD 11/21/22 1116    Corena Herter, MD 11/21/22 1145

## 2022-11-21 NOTE — ED Notes (Signed)
Pt resting comfortably in hall bed, on dinamap, family at bedside, pending CT scans.

## 2022-11-21 NOTE — ED Notes (Addendum)
Irrigated abrasion on patients forehead per MD order.

## 2022-11-21 NOTE — Discharge Instructions (Signed)
Kevin Bradshaw was seen in the emergency department following a fall with head injury.  CT scan of the head, face and neck did not show any new fracture or internal bleeding.  His laceration was repaired with absorbable sutures and they should resorb on their own.  Follow-up closely with his primary care physician.  Return for any worsening symptoms.

## 2022-11-21 NOTE — ED Notes (Signed)
Attempted to call [facility] for discharge. Discharge instructions given to daughter at bedside. Daughter is taking patient back to his home, [facility].

## 2023-03-24 ENCOUNTER — Emergency Department

## 2023-03-24 ENCOUNTER — Inpatient Hospital Stay
Admission: EM | Admit: 2023-03-24 | Discharge: 2023-03-28 | DRG: 640 | Disposition: A | Discharge status: Hospice | Source: Skilled Nursing Facility | Attending: Hospitalist | Admitting: Hospitalist

## 2023-03-24 ENCOUNTER — Other Ambulatory Visit: Payer: Self-pay

## 2023-03-24 DIAGNOSIS — R509 Fever, unspecified: Secondary | ICD-10-CM | POA: Diagnosis present

## 2023-03-24 DIAGNOSIS — Z79899 Other long term (current) drug therapy: Secondary | ICD-10-CM

## 2023-03-24 DIAGNOSIS — I480 Paroxysmal atrial fibrillation: Secondary | ICD-10-CM | POA: Diagnosis present

## 2023-03-24 DIAGNOSIS — Z8673 Personal history of transient ischemic attack (TIA), and cerebral infarction without residual deficits: Secondary | ICD-10-CM

## 2023-03-24 DIAGNOSIS — E870 Hyperosmolality and hypernatremia: Principal | ICD-10-CM | POA: Diagnosis present

## 2023-03-24 DIAGNOSIS — F02B0 Dementia in other diseases classified elsewhere, moderate, without behavioral disturbance, psychotic disturbance, mood disturbance, and anxiety: Secondary | ICD-10-CM | POA: Diagnosis present

## 2023-03-24 DIAGNOSIS — E785 Hyperlipidemia, unspecified: Secondary | ICD-10-CM | POA: Diagnosis present

## 2023-03-24 DIAGNOSIS — L89320 Pressure ulcer of left buttock, unstageable: Secondary | ICD-10-CM | POA: Diagnosis present

## 2023-03-24 DIAGNOSIS — I959 Hypotension, unspecified: Secondary | ICD-10-CM | POA: Diagnosis present

## 2023-03-24 DIAGNOSIS — J189 Pneumonia, unspecified organism: Secondary | ICD-10-CM | POA: Diagnosis present

## 2023-03-24 DIAGNOSIS — I5032 Chronic diastolic (congestive) heart failure: Secondary | ICD-10-CM | POA: Diagnosis present

## 2023-03-24 DIAGNOSIS — Z66 Do not resuscitate: Secondary | ICD-10-CM | POA: Diagnosis present

## 2023-03-24 DIAGNOSIS — E860 Dehydration: Secondary | ICD-10-CM | POA: Diagnosis present

## 2023-03-24 DIAGNOSIS — R6510 Systemic inflammatory response syndrome (SIRS) of non-infectious origin without acute organ dysfunction: Secondary | ICD-10-CM

## 2023-03-24 DIAGNOSIS — Z6825 Body mass index (BMI) 25.0-25.9, adult: Secondary | ICD-10-CM

## 2023-03-24 DIAGNOSIS — Z7982 Long term (current) use of aspirin: Secondary | ICD-10-CM

## 2023-03-24 DIAGNOSIS — R296 Repeated falls: Secondary | ICD-10-CM | POA: Diagnosis present

## 2023-03-24 DIAGNOSIS — E861 Hypovolemia: Secondary | ICD-10-CM | POA: Diagnosis present

## 2023-03-24 DIAGNOSIS — Z1152 Encounter for screening for COVID-19: Secondary | ICD-10-CM

## 2023-03-24 DIAGNOSIS — Z823 Family history of stroke: Secondary | ICD-10-CM

## 2023-03-24 DIAGNOSIS — I4891 Unspecified atrial fibrillation: Secondary | ICD-10-CM | POA: Diagnosis present

## 2023-03-24 DIAGNOSIS — L89150 Pressure ulcer of sacral region, unstageable: Secondary | ICD-10-CM | POA: Diagnosis present

## 2023-03-24 DIAGNOSIS — Z87891 Personal history of nicotine dependence: Secondary | ICD-10-CM

## 2023-03-24 DIAGNOSIS — Z515 Encounter for palliative care: Secondary | ICD-10-CM

## 2023-03-24 DIAGNOSIS — R627 Adult failure to thrive: Secondary | ICD-10-CM | POA: Diagnosis present

## 2023-03-24 DIAGNOSIS — I110 Hypertensive heart disease with heart failure: Secondary | ICD-10-CM | POA: Diagnosis present

## 2023-03-24 DIAGNOSIS — Z8249 Family history of ischemic heart disease and other diseases of the circulatory system: Secondary | ICD-10-CM

## 2023-03-24 DIAGNOSIS — Z7902 Long term (current) use of antithrombotics/antiplatelets: Secondary | ICD-10-CM

## 2023-03-24 DIAGNOSIS — R4182 Altered mental status, unspecified: Secondary | ICD-10-CM | POA: Diagnosis present

## 2023-03-24 DIAGNOSIS — Z87440 Personal history of urinary (tract) infections: Secondary | ICD-10-CM

## 2023-03-24 DIAGNOSIS — G20A1 Parkinson's disease without dyskinesia, without mention of fluctuations: Secondary | ICD-10-CM | POA: Diagnosis present

## 2023-03-24 DIAGNOSIS — G9341 Metabolic encephalopathy: Secondary | ICD-10-CM | POA: Diagnosis present

## 2023-03-24 DIAGNOSIS — L89313 Pressure ulcer of right buttock, stage 3: Secondary | ICD-10-CM | POA: Diagnosis present

## 2023-03-24 MED ORDER — SODIUM CHLORIDE 0.9 % IV SOLN
2.0000 g | Freq: Once | INTRAVENOUS | Status: AC
  Administered 2023-03-25: 2 g via INTRAVENOUS
  Filled 2023-03-24: qty 12.5

## 2023-03-24 MED ORDER — METRONIDAZOLE 500 MG/100ML IV SOLN
500.0000 mg | Freq: Once | INTRAVENOUS | Status: AC
  Administered 2023-03-25: 500 mg via INTRAVENOUS
  Filled 2023-03-24: qty 100

## 2023-03-24 MED ORDER — VANCOMYCIN HCL IN DEXTROSE 1-5 GM/200ML-% IV SOLN
1000.0000 mg | Freq: Once | INTRAVENOUS | Status: AC
  Administered 2023-03-25: 1000 mg via INTRAVENOUS
  Filled 2023-03-24: qty 200

## 2023-03-24 MED ORDER — SODIUM CHLORIDE 0.9 % IV BOLUS (SEPSIS)
1000.0000 mL | Freq: Once | INTRAVENOUS | Status: AC
  Administered 2023-03-24: 1000 mL via INTRAVENOUS

## 2023-03-24 NOTE — ED Provider Notes (Signed)
 [facility] Provider Note    Event Date/Time   First MD Initiated Contact with Patient 03/24/23 2309     (approximate)  History   Chief Complaint: abnormal labs  HPI  Kevin Bradshaw is a 88 y.o. male with a past medical history of CHF, hypertension, hyperlipidemia, Parkinson's disease, prior CVA, paroxysmal atrial fibrillation who presents from his memory care facility for abnormal lab work with reported elevated sodium level.  Patient also noted to be febrile from 100.0 by EMS, currently 99.9 in the emergency department and tacky around 130 bpm.  Physical Exam   Triage Vital Signs: ED Triage Vitals  Encounter Vitals Group     BP 03/24/23 2219 (!) 107/58     Systolic BP Percentile --      Diastolic BP Percentile --      Pulse Rate 03/24/23 2219 95     Resp 03/24/23 2219 19     Temp 03/24/23 2219 99.9 F (37.7 C)     Temp Source 03/24/23 2219 Rectal     SpO2 03/24/23 2219 95 %     Weight 03/24/23 2223 143 lb (64.9 kg)     Height 03/24/23 2223 5\' 3"  (1.6 m)     Head Circumference --      Peak Flow --      Pain Score --      Pain Loc --      Pain Education --      Exclude from Growth Chart --     Most recent vital signs: Vitals:   03/24/23 2219  BP: (!) 107/58  Pulse: 95  Resp: 19  Temp: 99.9 F (37.7 C)  SpO2: 95%    General: Patient is awake.  Will look at you when you talk to him but does not answer questions or follow commands.  Per EMS per the patient's living facility this is largely baseline for the patient. CV:  Good peripheral perfusion.  Irregular rhythm rate around 130 bpm. Resp:  Normal effort.  Equal breath sounds bilaterally.  No obvious wheeze rales rhonchi Abd:  No distention.  Soft, nontender.  No rebound or guarding. Other:  Chronic indwelling urinary catheter   ED Results / Procedures / Treatments   EKG  EKG viewed and interpreted by myself shows atrial fibrillation with rapid ventricular response at 139 bpm with  a slightly widened QRS, normal axis, largely normal intervals with nonspecific ST changes without ST elevation.  RADIOLOGY  I have reviewed and interpreted chest x-ray images.  High riding right diaphragm but no consolidation seen on my evaluation.   MEDICATIONS ORDERED IN ED: Medications  sodium chloride 0.9 % bolus 1,000 mL (has no administration in time range)  ceFEPIme (MAXIPIME) 2 g in sodium chloride 0.9 % 100 mL IVPB (has no administration in time range)  metroNIDAZOLE (FLAGYL) IVPB 500 mg (has no administration in time range)  vancomycin (VANCOCIN) IVPB 1000 mg/200 mL premix (has no administration in time range)     IMPRESSION / MDM / ASSESSMENT AND PLAN / ED COURSE  I reviewed the triage vital signs and the nursing notes.  Patient's presentation is most consistent with acute presentation with potential threat to life or bodily function.  Patient presents to the emergency department for reported abnormal lab work such as an elevated sodium level.  I am unable to see any of the patient's labs in care everywhere or our system.  Will recheck lab work in the emergency department.  Patient also  noted to be borderline febrile 99.9 currently and tachycardic at 130 bpm concerning for possible sepsis.  We will check labs, cultures, IV hydrate and start the patient on broad-spectrum antibiotics while awaiting further workup.  Differential is quite broad but would include infectious etiology such as urinary tract infection, COVID, influenza, pneumonia, metabolic or electrolyte abnormality, possible reported hyponatremia which could be related to dehydration as the patient has dry appearing mucous membranes.  Patient's labs have resulted showing a reassuring CBC with a normal white blood cell count.  Urinalysis is consistent with urinary tract infection however patient has a chronic indwelling catheter.  Lactic acid borderline elevated at 1.9.  Patient's chemistry shows significant hyper natremia  at 157 with a chloride of 118.  Will start the patient on a lactated Ringer's infusion.  FINAL CLINICAL IMPRESSION(S) / ED DIAGNOSES   Weakness Atrial fibrillation with rapid ventricular response Hyponatremia Dehydration  Note:  This document was prepared using Dragon voice recognition software and may include unintentional dictation errors.   Minna Antis, MD 03/25/23 [phone]

## 2023-03-24 NOTE — ED Triage Notes (Signed)
 Pt coming from memory care via EMS d/t NP calling about elevated sodium labs. Per EMS pt mildly febrile. A&Ox0 per baseline after taking nighttime meds.

## 2023-03-25 ENCOUNTER — Other Ambulatory Visit: Payer: Self-pay

## 2023-03-25 ENCOUNTER — Encounter: Payer: Self-pay | Admitting: Internal Medicine

## 2023-03-25 DIAGNOSIS — Z6825 Body mass index (BMI) 25.0-25.9, adult: Secondary | ICD-10-CM | POA: Diagnosis not present

## 2023-03-25 DIAGNOSIS — R6510 Systemic inflammatory response syndrome (SIRS) of non-infectious origin without acute organ dysfunction: Secondary | ICD-10-CM | POA: Diagnosis not present

## 2023-03-25 DIAGNOSIS — I480 Paroxysmal atrial fibrillation: Secondary | ICD-10-CM | POA: Diagnosis present

## 2023-03-25 DIAGNOSIS — R4182 Altered mental status, unspecified: Secondary | ICD-10-CM | POA: Diagnosis present

## 2023-03-25 DIAGNOSIS — Z789 Other specified health status: Secondary | ICD-10-CM | POA: Diagnosis not present

## 2023-03-25 DIAGNOSIS — E870 Hyperosmolality and hypernatremia: Principal | ICD-10-CM

## 2023-03-25 DIAGNOSIS — Z7902 Long term (current) use of antithrombotics/antiplatelets: Secondary | ICD-10-CM | POA: Diagnosis not present

## 2023-03-25 DIAGNOSIS — Z8673 Personal history of transient ischemic attack (TIA), and cerebral infarction without residual deficits: Secondary | ICD-10-CM | POA: Diagnosis not present

## 2023-03-25 DIAGNOSIS — G20A1 Parkinson's disease without dyskinesia, without mention of fluctuations: Secondary | ICD-10-CM

## 2023-03-25 DIAGNOSIS — E860 Dehydration: Secondary | ICD-10-CM | POA: Diagnosis present

## 2023-03-25 DIAGNOSIS — J189 Pneumonia, unspecified organism: Secondary | ICD-10-CM | POA: Diagnosis not present

## 2023-03-25 DIAGNOSIS — R509 Fever, unspecified: Secondary | ICD-10-CM | POA: Diagnosis present

## 2023-03-25 DIAGNOSIS — L89313 Pressure ulcer of right buttock, stage 3: Secondary | ICD-10-CM | POA: Diagnosis present

## 2023-03-25 DIAGNOSIS — I110 Hypertensive heart disease with heart failure: Secondary | ICD-10-CM | POA: Diagnosis present

## 2023-03-25 DIAGNOSIS — Z515 Encounter for palliative care: Secondary | ICD-10-CM

## 2023-03-25 DIAGNOSIS — Z7189 Other specified counseling: Secondary | ICD-10-CM | POA: Diagnosis not present

## 2023-03-25 DIAGNOSIS — E785 Hyperlipidemia, unspecified: Secondary | ICD-10-CM | POA: Diagnosis present

## 2023-03-25 DIAGNOSIS — R296 Repeated falls: Secondary | ICD-10-CM | POA: Diagnosis present

## 2023-03-25 DIAGNOSIS — I5032 Chronic diastolic (congestive) heart failure: Secondary | ICD-10-CM | POA: Diagnosis present

## 2023-03-25 DIAGNOSIS — Z8249 Family history of ischemic heart disease and other diseases of the circulatory system: Secondary | ICD-10-CM | POA: Diagnosis not present

## 2023-03-25 DIAGNOSIS — L89320 Pressure ulcer of left buttock, unstageable: Secondary | ICD-10-CM | POA: Diagnosis present

## 2023-03-25 DIAGNOSIS — Z7982 Long term (current) use of aspirin: Secondary | ICD-10-CM | POA: Diagnosis not present

## 2023-03-25 DIAGNOSIS — Z1152 Encounter for screening for COVID-19: Secondary | ICD-10-CM | POA: Diagnosis not present

## 2023-03-25 DIAGNOSIS — R627 Adult failure to thrive: Secondary | ICD-10-CM | POA: Diagnosis present

## 2023-03-25 DIAGNOSIS — G9341 Metabolic encephalopathy: Secondary | ICD-10-CM | POA: Diagnosis present

## 2023-03-25 DIAGNOSIS — F02B0 Dementia in other diseases classified elsewhere, moderate, without behavioral disturbance, psychotic disturbance, mood disturbance, and anxiety: Secondary | ICD-10-CM

## 2023-03-25 DIAGNOSIS — Z66 Do not resuscitate: Secondary | ICD-10-CM | POA: Diagnosis present

## 2023-03-25 DIAGNOSIS — E861 Hypovolemia: Secondary | ICD-10-CM | POA: Diagnosis present

## 2023-03-25 DIAGNOSIS — L89150 Pressure ulcer of sacral region, unstageable: Secondary | ICD-10-CM | POA: Diagnosis present

## 2023-03-25 DIAGNOSIS — I959 Hypotension, unspecified: Secondary | ICD-10-CM | POA: Diagnosis present

## 2023-03-25 LAB — COMPREHENSIVE METABOLIC PANEL
ALT: 28 U/L (ref 0–44)
AST: 44 U/L — ABNORMAL HIGH (ref 15–41)
Albumin: 2.1 g/dL — ABNORMAL LOW (ref 3.5–5.0)
Alkaline Phosphatase: 61 U/L (ref 38–126)
Anion gap: 13 (ref 5–15)
BUN: 34 mg/dL — ABNORMAL HIGH (ref 8–23)
CO2: 26 mmol/L (ref 22–32)
Calcium: 9.4 mg/dL (ref 8.9–10.3)
Chloride: 118 mmol/L — ABNORMAL HIGH (ref 98–111)
Creatinine, Ser: 1.15 mg/dL (ref 0.61–1.24)
GFR, Estimated: >60 mL/min (ref >60)
Glucose, Bld: 127 mg/dL — ABNORMAL HIGH (ref 70–99)
Potassium: 3.4 mmol/L — ABNORMAL LOW (ref 3.5–5.1)
Sodium: 157 mmol/L — ABNORMAL HIGH (ref 135–145)
Total Bilirubin: 0.4 mg/dL (ref 0.0–1.2)
Total Protein: 6.8 g/dL (ref 6.5–8.1)

## 2023-03-25 LAB — URINALYSIS, COMPLETE (UACMP) WITH MICROSCOPIC
Bilirubin Urine: NEGATIVE
Glucose, UA: NEGATIVE mg/dL
Hgb urine dipstick: NEGATIVE
Ketones, ur: NEGATIVE mg/dL
Leukocytes,Ua: NEGATIVE
Nitrite: NEGATIVE
Protein, ur: 30 mg/dL — AB
Specific Gravity, Urine: 1.029 (ref 1.005–1.030)
Squamous Epithelial / HPF: 0 /HPF (ref 0–5)
pH: 5 (ref 5.0–8.0)

## 2023-03-25 LAB — CBC
HCT: 40.6 % (ref 39.0–52.0)
HCT: 36.1 % — ABNORMAL LOW (ref 39.0–52.0)
Hemoglobin: 12.3 g/dL — ABNORMAL LOW (ref 13.0–17.0)
Hemoglobin: 11 g/dL — ABNORMAL LOW (ref 13.0–17.0)
MCH: 29.3 pg (ref 26.0–34.0)
MCH: 29.7 pg (ref 26.0–34.0)
MCHC: 30.3 g/dL (ref 30.0–36.0)
MCHC: 30.5 g/dL (ref 30.0–36.0)
MCV: 96.7 fL (ref 80.0–100.0)
MCV: 97.6 fL (ref 80.0–100.0)
Platelets: 219 10*3/uL (ref 150–400)
Platelets: 205 10*3/uL (ref 150–400)
RBC: 4.2 MIL/uL — ABNORMAL LOW (ref 4.22–5.81)
RBC: 3.7 MIL/uL — ABNORMAL LOW (ref 4.22–5.81)
RDW: 15.9 % — ABNORMAL HIGH (ref 11.5–15.5)
RDW: 15.8 % — ABNORMAL HIGH (ref 11.5–15.5)
WBC: 10.3 10*3/uL (ref 4.0–10.5)
WBC: 9.2 10*3/uL (ref 4.0–10.5)
nRBC: 0 % (ref 0.0–0.2)
nRBC: 0 % (ref 0.0–0.2)

## 2023-03-25 LAB — BASIC METABOLIC PANEL
Anion gap: 7 (ref 5–15)
BUN: 31 mg/dL — ABNORMAL HIGH (ref 8–23)
CO2: 27 mmol/L (ref 22–32)
Calcium: 8.6 mg/dL — ABNORMAL LOW (ref 8.9–10.3)
Chloride: 118 mmol/L — ABNORMAL HIGH (ref 98–111)
Creatinine, Ser: 1.01 mg/dL (ref 0.61–1.24)
GFR, Estimated: >60 mL/min (ref >60)
Glucose, Bld: 121 mg/dL — ABNORMAL HIGH (ref 70–99)
Potassium: 3.5 mmol/L (ref 3.5–5.1)
Sodium: 152 mmol/L — ABNORMAL HIGH (ref 135–145)

## 2023-03-25 LAB — PROTIME-INR
INR: 1.2 (ref 0.8–1.2)
Prothrombin Time: 15 s (ref 11.4–15.2)

## 2023-03-25 LAB — APTT: aPTT: 32 s (ref 24–36)

## 2023-03-25 LAB — RESP PANEL BY RT-PCR (RSV, FLU A&B, COVID)  RVPGX2
Influenza A by PCR: NEGATIVE
Influenza B by PCR: NEGATIVE
Resp Syncytial Virus by PCR: NEGATIVE
SARS Coronavirus 2 by RT PCR: NEGATIVE

## 2023-03-25 LAB — MAGNESIUM: Magnesium: 2.2 mg/dL (ref 1.7–2.4)

## 2023-03-25 LAB — SODIUM: Sodium: 150 mmol/L — ABNORMAL HIGH (ref 135–145)

## 2023-03-25 LAB — LACTIC ACID, PLASMA
Lactic Acid, Venous: 1.9 mmol/L (ref 0.5–1.9)
Lactic Acid, Venous: 1.8 mmol/L (ref 0.5–1.9)

## 2023-03-25 MED ORDER — ALBUTEROL SULFATE (2.5 MG/3ML) 0.083% IN NEBU
2.5000 mg | INHALATION_SOLUTION | RESPIRATORY_TRACT | Status: DC | PRN
  Administered 2023-03-27: 2.5 mg via RESPIRATORY_TRACT
  Filled 2023-03-25: qty 3

## 2023-03-25 MED ORDER — LACTATED RINGERS IV SOLN: INTRAVENOUS | Status: AC

## 2023-03-25 MED ORDER — ONDANSETRON HCL 4 MG/2ML IJ SOLN: 4.0000 mg | Freq: Four times a day (QID) | INTRAMUSCULAR | Status: DC | PRN

## 2023-03-25 MED ORDER — ONDANSETRON HCL 4 MG PO TABS: 4.0000 mg | ORAL_TABLET | Freq: Four times a day (QID) | ORAL | Status: DC | PRN

## 2023-03-25 MED ORDER — SODIUM CHLORIDE 0.9 % IV SOLN
500.0000 mg | INTRAVENOUS | Status: DC
  Administered 2023-03-25: 500 mg via INTRAVENOUS
  Filled 2023-03-25: qty 5

## 2023-03-25 MED ORDER — ACETAMINOPHEN 325 MG PO TABS: 650.0000 mg | ORAL_TABLET | Freq: Four times a day (QID) | ORAL | Status: DC | PRN

## 2023-03-25 MED ORDER — ENOXAPARIN SODIUM 40 MG/0.4ML IJ SOSY
40.0000 mg | PREFILLED_SYRINGE | INTRAMUSCULAR | Status: DC
  Administered 2023-03-25 – 2023-03-28 (×4): 40 mg via SUBCUTANEOUS
  Filled 2023-03-25 (×3): qty 0.4

## 2023-03-25 MED ORDER — SODIUM CHLORIDE 0.9 % IV SOLN: Freq: Once | INTRAVENOUS | Status: DC

## 2023-03-25 MED ORDER — SODIUM CHLORIDE 0.9 % IV BOLUS
500.0000 mL | Freq: Once | INTRAVENOUS | Status: AC
  Administered 2023-03-25: 500 mL via INTRAVENOUS

## 2023-03-25 MED ORDER — ACETAMINOPHEN 650 MG RE SUPP: 650.0000 mg | Freq: Four times a day (QID) | RECTAL | Status: DC | PRN

## 2023-03-25 MED ORDER — SODIUM CHLORIDE 0.9 % IV SOLN: 2.0000 g | INTRAVENOUS | Status: DC

## 2023-03-25 MED ORDER — DEXTROSE 5 % IV SOLN: INTRAVENOUS | Status: DC

## 2023-03-25 NOTE — ED Notes (Signed)
 Patient moved from bed 6 to bed 32 with RN assist X3.  Patient is non-verbal at present.  Unable to answer questions.  Patient readjusted in bed for comfort and warm blanket provided.

## 2023-03-25 NOTE — ED Notes (Signed)
 Family updated as to patient's status.

## 2023-03-25 NOTE — Progress Notes (Signed)
 CODE SEPSIS - PHARMACY COMMUNICATION  **Broad Spectrum Antibiotics should be administered within 1 hour of Sepsis diagnosis**  Time Code Sepsis Called/Page Received: 3/13 @ 2334   Antibiotics Ordered: cefepime, vancomycin, metronidazole  Time of 1st antibiotic administration: cefepime 2 gm IV X 1 on 3/14 @ 0002   Additional action taken by pharmacy:   If necessary, Name of Provider/Nurse Contacted:     Aleyda Gindlesperger D ,PharmD Clinical Pharmacist  03/25/2023  12:45 AM

## 2023-03-25 NOTE — Assessment & Plan Note (Signed)
 SIRS, possible sepsis Patient with altered mental status, hypotensive, tachycardic but with normal WBC and lactic acid.  Chest x-ray with possible infiltrate Rocephin and azithromycin Will hydrate with D5 because of hypernatremia Supplemental oxygen if needed Antitussives, DuoNebs as needed Incentive spirometer if able to participate Aspiration precautions N.p.o. until SLP eval

## 2023-03-25 NOTE — H&P (Addendum)
 History and Physical    Patient: Kevin Bradshaw ZOX:[id number] DOB: July 15, 1935 DOA: 03/24/2023 DOS: the patient was seen and examined on 03/25/2023 PCP: Gracelyn Nurse, MD  Patient coming from: Home  Chief Complaint:  Chief Complaint  Patient presents with   abnormal labs    HPI: Kevin Bradshaw is a 88 y.o. male with medical history significant for chronic A-fib not on anticoagulation due to frequent falls, HTN, HLD, Parkinson's disease,frequent falls, multiple CVAs since 2019 on aspirin and Plavix, , last hospitalized in September 2024 with Proteus UTI who was brought in by EMS with altered mental status, with outpatient labs showing elevated sodium.  Patient is unable to contribute to history. ED Course and data review: Hypotensive on arrival to 88/59, tachycardic to 111 and tachypneic to 22 O2 sats 94% on room air. Labs notable for WBC 10,000 with lactic acid 1.8 Sodium 157 with chloride 118 Hemoglobin 12.3 EKG, personally viewed and interpreted showing A-fib at 139 and VPCs Chest x-ray showing minimal patchy opacities in lung bases atelectasis versus infiltrate patient was treated with NS bolus Started on cefepime vancomycin and Flagyl for sepsis of unknown source Hospitalist consulted for admission.     Past Medical History:  Diagnosis Date   Diastolic dysfunction    a. 09/2018 Echo: EF 55-60%, mild LVH. Diast dysfxn. Nl RV fxn. Nl LA size.   Falls    Hyperlipidemia    Hypertension    PAF (paroxysmal atrial fibrillation) (HCC)    a. 09/2018 in the setting of urosepsis; b. CHA2DS2VASc = 5-->No OAC due to high risk for falls (on ASA/Plavix).   Parkinson disease (HCC)    Stroke (HCC) 08/2017   Urosepsis (HCC) 09/2018   Past Surgical History:  Procedure Laterality Date   CARDIOVASCULAR STRESS TEST     normal   CARPAL TUNNEL RELEASE  2009   right hand   X-STOP IMPLANTATION     X-STOP IMPLANTATION     Dr. Gerrit Heck   Social History:  reports that he quit smoking about 20  years ago. His smoking use included cigarettes. He started smoking about 70 years ago. He has a 75 pack-year smoking history. He has never used smokeless tobacco. He reports current alcohol use. He reports that he does not use drugs.  No Known Allergies  Family History  Problem Relation Age of Onset   Heart disease Father 52   Cancer Sister        ? type   Heart disease Brother    Cancer Brother        ? type   Stroke Child    Seizures Child     Prior to Admission medications   Medication Sig Start Date End Date Taking? Authorizing Provider  traZODone (DESYREL) 50 MG tablet Take 50 mg by mouth at bedtime. 03/19/23  Yes [provider]  acetaminophen (TYLENOL) 325 MG tablet Take 2 tablets (650 mg total) by mouth every 6 (six) hours as needed for mild pain (or Fever >/= 101). 02/21/18   Ramonita Lab, MD  aspirin 81 MG tablet Take 81 mg by mouth daily.      [provider]  ATIVAN 0.5 MG tablet Take 0.5 mg by mouth every 8 (eight) hours as needed. 11/15/22   [provider]  atorvastatin (LIPITOR) 80 MG tablet Take 1 tablet (80 mg total) by mouth daily at 6 PM. 08/23/17   Shaune Pollack, MD  cefTRIAXone (ROCEPHIN) 2 g injection  03/21/23   [provider]  clopidogrel (PLAVIX) 75 MG tablet Take 1 tablet (75 mg total) by mouth daily. 08/23/17   Shaune Pollack, MD  divalproex (DEPAKOTE SPRINKLE) 125 MG capsule Take 125 mg by mouth 3 (three) times daily. 03/22/23   [provider]  doxycycline (VIBRAMYCIN) 100 MG capsule Take 100 mg by mouth 2 (two) times daily. 03/18/23   [provider]  feeding supplement (ENSURE ENLIVE / ENSURE PLUS) LIQD Take 237 mLs by mouth 3 (three) times daily between meals. 04/01/22   Sunnie Nielsen, DO  finasteride (PROSCAR) 5 MG tablet Take 5 mg by mouth daily.    [provider]  furosemide (LASIX) 20 MG tablet Take 20 mg by mouth daily. 03/18/23   [provider]  lidocaine (XYLOCAINE) 1 % (with  preservative) injection  03/21/23   [provider]  metoprolol succinate (TOPROL-XL) 50 MG 24 hr tablet Take 50 mg by mouth daily.    [provider]  QUEtiapine (SEROQUEL) 25 MG tablet Take 25 mg by mouth at bedtime. 02/19/23   [provider]  senna-docusate (SENOKOT-S) 8.6-50 MG tablet Take 1 tablet by mouth at bedtime as needed for mild constipation. 04/01/22   Sunnie Nielsen, DO  tamsulosin (FLOMAX) 0.4 MG CAPS capsule Take 0.4 mg by mouth daily. 12/26/20   [provider]  traMADol (ULTRAM) 50 MG tablet Take 50 mg by mouth 2 (two) times daily as needed. 03/11/23   [provider]  Calcium Carbonate (CALCIUM 500 PO) Take 2 tablets by mouth daily.    01/06/16  [provider]    Physical Exam: Vitals:   03/25/23 0225 03/25/23 0230 03/25/23 0231 03/25/23 0235  BP: (!) 88/43 (!) 88/59  (!) 89/54  Pulse: 98 93 (!) 111 (!) 101  Resp:   (!) 22   Temp:   98.7 F (37.1 C)   TempSrc:   Axillary   SpO2: (!) 86% 96% 94% 98%  Weight:      Height:       Physical Exam Vitals and nursing note reviewed.  Constitutional:      General: He is sleeping. He is not in acute distress. HENT:     Head: Normocephalic and atraumatic.  Cardiovascular:     Rate and Rhythm: Tachycardia present. Rhythm irregular.     Heart sounds: Normal heart sounds.  Pulmonary:     Effort: Pulmonary effort is normal.     Breath sounds: Decreased air movement present.  Abdominal:     Palpations: Abdomen is soft.     Tenderness: There is no abdominal tenderness.  Neurological:     Mental Status: He is easily aroused. He is lethargic.     Labs on Admission: I have personally reviewed following labs and imaging studies  CBC: Recent Labs  Lab 03/24/23 2324  WBC 10.3  HGB 12.3*  HCT 40.6  MCV 96.7  PLT 219   Basic Metabolic Panel: Recent Labs  Lab 03/24/23 2324  NA 157*  K 3.4*  CL 118*  CO2 26  GLUCOSE 127*  BUN 34*  CREATININE 1.15  CALCIUM  9.4   GFR: Estimated Creatinine Clearance: 37.8 mL/min (by C-G formula based on SCr of 1.15 mg/dL). Liver Function Tests: Recent Labs  Lab 03/24/23 2324  AST 44*  ALT 28  ALKPHOS 61  BILITOT 0.4  PROT 6.8  ALBUMIN 2.1*   No results for input(s): "LIPASE", "AMYLASE" in the last 168 hours. No results for input(s): "AMMONIA" in the last 168 hours. Coagulation Profile:  Recent Labs  Lab 03/24/23 2324  INR 1.2   Cardiac Enzymes: No results for input(s): "CKTOTAL", "CKMB", "CKMBINDEX", "TROPONINI" in the last 168 hours. BNP (last 3 results) No results for input(s): "PROBNP" in the last 8760 hours. HbA1C: No results for input(s): "HGBA1C" in the last 72 hours. CBG: No results for input(s): "GLUCAP" in the last 168 hours. Lipid Profile: No results for input(s): "CHOL", "HDL", "LDLCALC", "TRIG", "CHOLHDL", "LDLDIRECT" in the last 72 hours. Thyroid Function Tests: No results for input(s): "TSH", "T4TOTAL", "FREET4", "T3FREE", "THYROIDAB" in the last 72 hours. Anemia Panel: No results for input(s): "VITAMINB12", "FOLATE", "FERRITIN", "TIBC", "IRON", "RETICCTPCT" in the last 72 hours. Urine analysis:    Component Value Date/Time   COLORURINE YELLOW (A) 03/24/2023 2325   APPEARANCEUR CLEAR (A) 03/24/2023 2325   LABSPEC 1.029 03/24/2023 2325   PHURINE 5.0 03/24/2023 2325   GLUCOSEU NEGATIVE 03/24/2023 2325   HGBUR NEGATIVE 03/24/2023 2325   BILIRUBINUR NEGATIVE 03/24/2023 2325   KETONESUR NEGATIVE 03/24/2023 2325   PROTEINUR 30 (A) 03/24/2023 2325   NITRITE NEGATIVE 03/24/2023 2325   LEUKOCYTESUR NEGATIVE 03/24/2023 2325    Radiological Exams on Admission: DG Chest Port 1 View Result Date: 03/25/2023 CLINICAL DATA:  Questionable sepsis EXAM: PORTABLE CHEST 1 VIEW COMPARISON:  Chest x-ray 04/15/2022 FINDINGS: Heart is mildly enlarged. There are minimal patchy opacities in the lung bases. There are atherosclerotic calcifications. There is no pleural effusion or pneumothorax. No  acute fractures are seen. IMPRESSION: Minimal patchy opacities in the lung bases, atelectasis versus infiltrate. Electronically Signed   By: Darliss Cheney M.D.   On: 03/25/2023 01:18     Data Reviewed: Relevant notes from primary care and specialist visits, past discharge summaries as available in EHR, including Care Everywhere. Prior diagnostic testing as pertinent to current admission diagnoses Updated medications and problem lists for reconciliation ED course, including vitals, labs, imaging, treatment and response to treatment Triage notes, nursing and pharmacy notes and ED provider's notes Notable results as noted in HPI   Assessment and Plan: * CAP (community acquired pneumonia) SIRS, possible sepsis Patient with altered mental status, hypotensive, tachycardic but with normal WBC and lactic acid.  Chest x-ray with possible infiltrate Rocephin and azithromycin Will hydrate with D5 because of hypernatremia Supplemental oxygen if needed Antitussives, DuoNebs as needed Incentive spirometer if able to participate Aspiration precautions N.p.o. until SLP eval  Acute metabolic encephalopathy Secondary to possible sepsis Aspiration precautions, neurologic checks  Hypernatremia IV hydration with D5 and monitor  Atrial fibrillation with RVR (HCC) EKG with A-fib 139, improved with IV fluid bolus Holding metoprolol due to soft blood pressures on arrival Not currently on anticoagulation due to frequent falls  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) Currently on quetiapine, Depakote sprinkles trazodone and Ativan Will hold due to altered mental status and will resume once more alert and safe to swallow  History of stroke Continue DAPT once safe to swallow     DVT prophylaxis: Lovenox  Consults: none  Advance Care Planning:   Code Status: Prior   Family Communication: none  Disposition Plan: Back to previous home environment  Severity of Illness: The  appropriate patient status for this patient is OBSERVATION. Observation status is judged to be reasonable and necessary in order to provide the required intensity of service to ensure the patient's safety. The patient's presenting symptoms, physical exam findings, and initial radiographic and laboratory data in the context of their medical condition is felt to place them at decreased risk for further  clinical deterioration. Furthermore, it is anticipated that the patient will be medically stable for discharge from the hospital within 2 midnights of admission.   Author: Andris Baumann, MD 03/25/2023 3:12 AM  For on call review [email or web address].

## 2023-03-25 NOTE — Assessment & Plan Note (Signed)
 Secondary to possible sepsis Aspiration precautions, neurologic checks

## 2023-03-25 NOTE — Evaluation (Signed)
 Clinical/Bedside Swallow Evaluation Patient Details  Name: Kevin Bradshaw MRN: [id number] Date of Birth: 1935/09/23  Today's Date: 03/25/2023 Time: SLP Start Time (ACUTE ONLY): 1335 SLP Stop Time (ACUTE ONLY): 1435 SLP Time Calculation (min) (ACUTE ONLY): 60 min  Past Medical History:  Past Medical History:  Diagnosis Date   Diastolic dysfunction    a. 09/2018 Echo: EF 55-60%, mild LVH. Diast dysfxn. Nl RV fxn. Nl LA size.   Falls    Hyperlipidemia    Hypertension    PAF (paroxysmal atrial fibrillation) (HCC)    a. 09/2018 in the setting of urosepsis; b. CHA2DS2VASc = 5-->No OAC due to high risk for falls (on ASA/Plavix).   Parkinson disease (HCC)    Stroke (HCC) 08/2017   Urosepsis (HCC) 09/2018   Past Surgical History:  Past Surgical History:  Procedure Laterality Date   CARDIOVASCULAR STRESS TEST     normal   CARPAL TUNNEL RELEASE  2009   right hand   X-STOP IMPLANTATION     X-STOP IMPLANTATION     Dr. Gerrit Heck   HPI:  Pt is a 88 y.o. male with medical history significant for Dementia, chronic A-fib not on anticoagulation due to frequent falls, HTN, HLD, Parkinson's disease, frequent falls, multiple CVAs since 2019 on aspirin and Plavix, , last hospitalized in September 2024 with Proteus UTI who was brought in by EMS with altered mental status, with outpatient labs showing elevated sodium.  Patient is unable to contribute to history. Pt resides at a NH. ED Course and data review: Hypotensive on arrival to 88/59, tachycardic to 111 and tachypneic to 22 O2 sats 94% on room air.    CXR: Minimal patchy opacities in the lung bases, atelectasis versus  infiltrate.    Assessment / Plan / Recommendation  Clinical Impression   Pt seen for BSE this afternoon. Pt awakened w/ gentle voice and stim; needed MAX assistance w/ repositioning in the bed. Noted elevated HR(124) - NSG informed. Pt exhibited mumbled/muttered speech w/ only few intelligible words. Pt attempted/reached out to hold cup  to drink x2. He needed MOD cues for po tasks but seemed eager to have something to drink once started.  Afebrile. On RA. WBC 9.2  Pt appears to present w/ oropharyngeal phase dysphagia in setting of declined Cognitive status; Baseline Dementia and Parkinson's Dis. ANY Cognitive decline can impact overall awareness/timing of swallow and safety during po tasks which increases risk for aspiration, choking. Pt's risk for aspiration appears to be reduced when following general aspiration precautions, given full feeding support, and when using a modified diet consistency -- a dysphagia level 1 w/ Nectar liquids.  He required MOD verbal/visual/tactile cues for follow through during po tasks.         Pt consumed several trials each of Purees and Nectar consistency liquids via tsp/straw w/ No overt, clinical s/s of aspiration noted: no decline in vocal quality, no cough, and no decline in respiratory status during/post trials. O2 sats remained 97-98%. Oral phase was adequate for bolus management and oral clearing of the boluses given. Time, cues, and alternating foods/liquids given to support. OM exam was cursory d/t follow through but appeared grossly [facility] w/ No unilateral OM weakness noted during bolus management. Confusion of OM tasks and oral care noted.          In setting of baseline Dementia and Cognitive decline, Parkinson's Dis, and Edentulous status, recommend initiation of the dysphagia level 1(PUREE foods moistened for ease of oral phase) w/ Nectar consistency  liquids; aspiration precautions; reduce Distractions during meals and FULL feeding assistance -- engage pt during meals for self-feeding as able. Pills Crushed in Puree for safer swallowing.  MD/NSG updated.  ST services recommends follow up w/ Palliative Care for GOC and education re: impact of Cognitive decline/Dementia on swallowing, and oral intake. Suspect pt is close to/at his baseline. ST services will f/u w/ toleration of diet and trials to  upgrade if appropriate for pt's safe oral intake overall. F/u at his next venue of care recommended also. Precautions posted in room, chart. SLP Visit Diagnosis: Dysphagia, oropharyngeal phase (R13.12) (Baseline Dementia, Parkison's Dis; feeding dependency; unable to follow commands)    Aspiration Risk  Mild aspiration risk;Moderate aspiration risk;Risk for inadequate nutrition/hydration    Diet Recommendation   Nectar;Dysphagia 1 (puree) = dysphagia level 1(PUREE foods moistened for ease of oral phase) w/ Nectar consistency liquids; aspiration precautions; reduce Distractions during meals and FULL feeding assistance -- engage pt during meals for self-feeding as able.   Medication Administration: Crushed with puree    Other  Recommendations Recommended Consults:  (Pallliative Care; Dietician) Oral Care Recommendations: Oral care BID;Oral care before and after PO;Staff/trained caregiver to provide oral care Caregiver Recommendations: Avoid jello, ice cream, thin soups, popsicles;Remove water pitcher;Have oral suction available    Recommendations for follow up therapy are one component of a multi-disciplinary discharge planning process, led by the attending physician.  Recommendations may be updated based on patient status, additional functional criteria and insurance authorization.  Follow up Recommendations Follow physician's recommendations for discharge plan and follow up therapies      Assistance Recommended at Discharge  FULL  Functional Status Assessment Patient has had a recent decline in their functional status and/or demonstrates limited ability to make significant improvements in function in a reasonable and predictable amount of time  Frequency and Duration min 2x/week  2 weeks       Prognosis Prognosis for improved oropharyngeal function: Fair Barriers to Reach Goals: Cognitive deficits;Language deficits;Time post onset;Severity of deficits Barriers/Prognosis Comment: Baseline  Dementia, Parkison's Dis; feeding dependency; unable to follow commands      Swallow Study   General Date of Onset: 03/24/23 HPI: Pt is a 88 y.o. male with medical history significant for Dementia, chronic A-fib not on anticoagulation due to frequent falls, HTN, HLD, Parkinson's disease, frequent falls, multiple CVAs since 2019 on aspirin and Plavix, , last hospitalized in September 2024 with Proteus UTI who was brought in by EMS with altered mental status, with outpatient labs showing elevated sodium.  Patient is unable to contribute to history. Pt resides at a NH. ED Course and data review: Hypotensive on arrival to 88/59, tachycardic to 111 and tachypneic to 22 O2 sats 94% on room air.    CXR: Minimal patchy opacities in the lung bases, atelectasis versus  infiltrate. Type of Study: Bedside Swallow Evaluation Previous Swallow Assessment: none but cog-lang eval noted Diet Prior to this Study: NPO Temperature Spikes Noted: No (wbc 9.2) Respiratory Status: Room air History of Recent Intubation: No Behavior/Cognition: Alert;Cooperative;Pleasant mood;Confused;Distractible;Requires cueing;Doesn't follow directions Oral Cavity Assessment: Within Functional Limits (appeared) Oral Care Completed by SLP: Yes (attempted) Oral Cavity - Dentition: Edentulous Vision:  (n/a) Self-Feeding Abilities: Total assist Patient Positioning: Upright in bed (MAX assist) Baseline Vocal Quality: Low vocal intensity (mumbled/muttered) Volitional Cough: Cognitively unable to elicit Volitional Swallow: Unable to elicit    Oral/Motor/Sensory Function Overall Oral Motor/Sensory Function:  (no gross unilateral weakness noted)   Ice Chips Ice chips: Not tested  Thin Liquid Thin Liquid: Not tested    Nectar Thick Nectar Thick Liquid: Within functional limits Presentation: Spoon;Straw (fed; ~3-4 ozs total)   Honey Thick Honey Thick Liquid: Not tested   Puree Puree: Within functional limits Presentation: Spoon (fed;  ~3 ozs)   Solid     Solid: Not tested        Jerilynn Som, MS, CCC-SLP Speech Language Pathologist Rehab Services; [facility] [place] [phone] (ascom) Anessia Oakland 03/25/2023,4:49 PM

## 2023-03-25 NOTE — Sepsis Progress Note (Signed)
 Following for sepsis monitoring ?

## 2023-03-25 NOTE — Hospital Course (Signed)
 chronic A-fib not on anticoagulation due to frequent falls, HTN, HLD, Parkinson's disease,frequent falls, multiple CVAs since 2019 on aspirin and Plavix, chronic urinary retention with Foley, last hospitalized in September 2024 with Proteus UTI who was brought in by EMS with altered mental status, with outpatient labs showing elevated sodium.  Patient is unable to contribute to history. ED Course and data review: Hypotensive on arrival to 88/59, tachycardic to 111 and tachypneic to 22 O2 sats 94% on room air. Labs notable for WBC 10,000 with lactic acid 1.8 Sodium 157 with chloride 118 Hemoglobin 12.3 EKG, personally viewed and interpreted showing A-fib at 139 and VPCs Chest x-ray showing minimal patchy opacities in lung bases atelectasis versus infiltrate patient was treated with NS bolus Started on cefepime vancomycin and Flagyl for sepsis of unknown source Hospitalist consulted for admission.

## 2023-03-25 NOTE — Assessment & Plan Note (Signed)
 EKG with A-fib 139, improved with IV fluid bolus Holding metoprolol due to soft blood pressures on arrival Not currently on anticoagulation due to frequent falls

## 2023-03-25 NOTE — ED Notes (Signed)
 MEWS Progress Note  Patient Details Name: Kevin Bradshaw MRN: [id number] DOB: September 17, 1935 Today's Date: 03/25/2023   MEWS Flowsheet Documentation:  Assess: MEWS Score Temp: 98.7 F (37.1 C) BP: (!) 83/51 MAP (mmHg): (!) 61 Pulse Rate: 88 ECG Heart Rate: (!) 102 Resp: (!) 22 Level of Consciousness: Responds to Pain SpO2: 100 % O2 Device: Room Air Patient Activity (if Appropriate): In bed Assess: MEWS Score MEWS Temp: 0 MEWS Systolic: 1 MEWS Pulse: 1 MEWS RR: 1 MEWS LOC: 2 MEWS Score: 5 MEWS Score Color: Red Assess: SIRS CRITERIA SIRS Temperature : 0 SIRS Respirations : 1 SIRS Pulse: 1 SIRS WBC: 0 SIRS Score Sum : 2 SIRS Temperature : 0 SIRS Pulse: 1 SIRS Respirations : 1 SIRS WBC: 0 SIRS Score Sum : 2 Assess: if the MEWS score is Yellow or Red Were vital signs accurate and taken at a resting state?: Yes Does the patient meet 2 or more of the SIRS criteria?: Yes Does the patient have a confirmed or suspected source of infection?: Yes Provider Notification Provider Name/Title: lai Date Provider Notified: 03/25/23 Time Provider Notified: 0727 Method of Notification: Page Notification Reason: Other (Comment) (MEWS RED) Provider response: Evaluate remotely, See new orders Date of Provider Response: 03/25/23 Time of Provider Response: [id number]  Notified Dr Fran Lowes, per shift report night shift MD was notified by RN about BP. See new orders from Dr Fran Lowes in EMR    Adair Laundry 03/25/2023, 7:32 AM

## 2023-03-25 NOTE — Assessment & Plan Note (Signed)
 Currently on quetiapine, Depakote sprinkles trazodone and Ativan Will hold due to altered mental status and will resume once more alert and safe to swallow

## 2023-03-25 NOTE — Assessment & Plan Note (Signed)
 Continue DAPT once safe to swallow

## 2023-03-25 NOTE — Consult Note (Signed)
 Consultation Note Date: 03/25/2023 at 0950  Patient Name: Kevin Bradshaw  DOB: March 01, 1935  MRN: [id number]  Age / Sex: 88 y.o., male  PCP: Gracelyn Nurse, MD Referring Physician: Darlin Priestly, MD  HPI/Patient Profile: 88 y.o. male  with past medical history of  chronic A-fib not on anticoagulation due to frequent falls, HTN, HLD, Parkinson's disease, frequent falls, multiple CVAs since 2019 on aspirin and Plavix, last hospitalized in September 2024 with Proteus UTI admitted on 03/24/2023 with AMS with outpatient labs revealing elevated sodium.  Patient is being treated for SIRS with possible sepsis and community-acquired pneumonia.  Patient presented hypotensive, tachycardic, but with WBC and lactic acid WNL.  Chest x-ray revealed possible infiltrates.  Patient given Rocephin and azithromycin as well as D5 for hydration.  SLP evaluation revealed low aspiration with risk and recommended a dysphagia diet with pills in pure.  PMT was consulted to support patient and family and goals of care discussions.   Clinical Assessment and Goals of Care: Extensive chart review completed prior to meeting patient including labs, vital signs, imaging, progress notes, orders, and available advanced directive documents from current and previous encounters. I then met with patient at bedside in ED.  He is asleep, easily awakens to my presence, and quickly returns to sleep.   No family or friends present during my visit.  Symptoms assessed.  Respirations are even and unlabored.  Pulses are palpable in both upper and lower extremities.  Nonverbal signs of distress such as brow furrowing, grimacing, moaning, and fidgeting not noted.  Patient has no signs of distress.  No adjustment to [facility] needed at this time.  I counseled with dayshift RN.  RN shares that patient has been sleeping most of the day.  He was awakened earlier to take some  medications.  He has not had any continued time of wakefulness.  He is compliant but lethargic.  Family was here earlier this morning but is left.  She was agreeable to notify me should family or friends come to bedside.    I attempted to speak with patient's daughter [name] over the phone.  No answer.  HIPAA compliant voicemail left with PMT contact info given.  [name] returned my phone call to our team phone.  I then returned hers.  I have missed being able to connect with [name] throughout the day across various phone call attempts.  A PMT colleague will follow-up with patient and family tomorrow to continue goals of care discussions.  No adjustment to plan of care at this time.  Primary Decision Maker HCPOA  Physical Exam Vitals reviewed.  Constitutional:      General: He is not in acute distress.    Appearance: He is normal weight.  HENT:     Head: Normocephalic.     Nose: Nose normal.     Mouth/Throat:     Mouth: Mucous membranes are moist.  Pulmonary:     Effort: Pulmonary effort is normal.  Abdominal:     Palpations: Abdomen is soft.  Skin:    General: Skin is warm and dry.     Coloration: Skin is pale.  Neurological:     Mental Status: He is alert.  Psychiatric:        Mood and Affect: Mood normal.        Behavior: Behavior normal.        Judgment: Judgment normal.     Palliative Assessment/Data: 30%     Thank you for this consult. Palliative medicine will continue to follow and assist holistically.   Time Total: 40 minutes  Time spent includes: Detailed review of medical records (labs, imaging, vital signs), medically appropriate exam (mental status, respiratory, cardiac, skin), discussed with treatment team, counseling and educating patient, family and staff, documenting clinical information, medication management and coordination of care.  Signed by: Georgiann Cocker, DNP, FNP-BC Palliative Medicine   Please contact Palliative Medicine Team providers via [facility]  for questions and concerns.

## 2023-03-25 NOTE — ED Notes (Signed)
 Notified Dr Fran Lowes via epic chat regarding pt's BP overnight and this morning. Awaiting results.

## 2023-03-25 NOTE — Assessment & Plan Note (Signed)
 IV hydration with D5 and monitor

## 2023-03-26 ENCOUNTER — Other Ambulatory Visit: Payer: Self-pay

## 2023-03-26 DIAGNOSIS — Z789 Other specified health status: Secondary | ICD-10-CM | POA: Diagnosis not present

## 2023-03-26 DIAGNOSIS — E870 Hyperosmolality and hypernatremia: Secondary | ICD-10-CM | POA: Diagnosis not present

## 2023-03-26 DIAGNOSIS — Z7189 Other specified counseling: Secondary | ICD-10-CM

## 2023-03-26 DIAGNOSIS — E860 Dehydration: Secondary | ICD-10-CM | POA: Diagnosis not present

## 2023-03-26 DIAGNOSIS — J189 Pneumonia, unspecified organism: Secondary | ICD-10-CM | POA: Diagnosis not present

## 2023-03-26 DIAGNOSIS — Z515 Encounter for palliative care: Secondary | ICD-10-CM | POA: Diagnosis not present

## 2023-03-26 LAB — MAGNESIUM: Magnesium: 2.1 mg/dL (ref 1.7–2.4)

## 2023-03-26 LAB — CBC
HCT: 37.9 % — ABNORMAL LOW (ref 39.0–52.0)
Hemoglobin: 11.7 g/dL — ABNORMAL LOW (ref 13.0–17.0)
MCH: 29.4 pg (ref 26.0–34.0)
MCHC: 30.9 g/dL (ref 30.0–36.0)
MCV: 95.2 fL (ref 80.0–100.0)
Platelets: 208 10*3/uL (ref 150–400)
RBC: 3.98 MIL/uL — ABNORMAL LOW (ref 4.22–5.81)
RDW: 15.5 % (ref 11.5–15.5)
WBC: 7.8 10*3/uL (ref 4.0–10.5)
nRBC: 0 % (ref 0.0–0.2)

## 2023-03-26 LAB — BASIC METABOLIC PANEL
Anion gap: 4 — ABNORMAL LOW (ref 5–15)
BUN: 22 mg/dL (ref 8–23)
CO2: 28 mmol/L (ref 22–32)
Calcium: 8.7 mg/dL — ABNORMAL LOW (ref 8.9–10.3)
Chloride: 116 mmol/L — ABNORMAL HIGH (ref 98–111)
Creatinine, Ser: 0.96 mg/dL (ref 0.61–1.24)
GFR, Estimated: >60 mL/min (ref >60)
Glucose, Bld: 97 mg/dL (ref 70–99)
Potassium: 3.5 mmol/L (ref 3.5–5.1)
Sodium: 148 mmol/L — ABNORMAL HIGH (ref 135–145)

## 2023-03-26 MED ORDER — METOPROLOL SUCCINATE ER 50 MG PO TB24
50.0000 mg | ORAL_TABLET | Freq: Every day | ORAL | Status: DC
  Administered 2023-03-26 – 2023-03-28 (×3): 50 mg via ORAL
  Filled 2023-03-26 (×3): qty 1

## 2023-03-26 MED ORDER — HYDRALAZINE HCL 20 MG/ML IJ SOLN: 10.0000 mg | Freq: Four times a day (QID) | INTRAMUSCULAR | Status: DC | PRN

## 2023-03-26 MED ORDER — KCL IN DEXTROSE-NACL 40-5-0.45 MEQ/L-%-% IV SOLN
INTRAVENOUS | Status: AC
  Filled 2023-03-26 (×2): qty 1000

## 2023-03-26 MED ORDER — TAMSULOSIN HCL 0.4 MG PO CAPS
0.4000 mg | ORAL_CAPSULE | Freq: Every day | ORAL | Status: DC
  Administered 2023-03-26 – 2023-03-28 (×3): 0.4 mg via ORAL
  Filled 2023-03-26 (×3): qty 1

## 2023-03-26 MED ORDER — FINASTERIDE 5 MG PO TABS
5.0000 mg | ORAL_TABLET | Freq: Every day | ORAL | Status: DC
Start: 2023-03-26 — End: 2023-03-28
  Administered 2023-03-26 – 2023-03-28 (×3): 5 mg via ORAL
  Filled 2023-03-26 (×3): qty 1

## 2023-03-26 MED ORDER — CLOPIDOGREL BISULFATE 75 MG PO TABS
75.0000 mg | ORAL_TABLET | Freq: Every day | ORAL | Status: DC
  Administered 2023-03-26 – 2023-03-28 (×3): 75 mg via ORAL
  Filled 2023-03-26 (×3): qty 1

## 2023-03-26 MED ORDER — ASPIRIN 81 MG PO TBEC
81.0000 mg | DELAYED_RELEASE_TABLET | Freq: Every day | ORAL | Status: DC
  Administered 2023-03-26 – 2023-03-28 (×3): 81 mg via ORAL
  Filled 2023-03-26 (×3): qty 1

## 2023-03-26 MED ORDER — DIVALPROEX SODIUM 125 MG PO CSDR
125.0000 mg | DELAYED_RELEASE_CAPSULE | Freq: Three times a day (TID) | ORAL | Status: DC
  Administered 2023-03-26 – 2023-03-28 (×7): 125 mg via ORAL
  Filled 2023-03-26 (×8): qty 1

## 2023-03-26 MED ORDER — CHLORHEXIDINE GLUCONATE CLOTH 2 % EX PADS
6.0000 | MEDICATED_PAD | Freq: Every day | CUTANEOUS | Status: DC
  Administered 2023-03-26 – 2023-03-28 (×3): 6 via TOPICAL

## 2023-03-26 MED ORDER — FUROSEMIDE 20 MG PO TABS
20.0000 mg | ORAL_TABLET | Freq: Every day | ORAL | Status: DC
  Administered 2023-03-26 – 2023-03-27 (×2): 20 mg via ORAL
  Filled 2023-03-26 (×2): qty 1

## 2023-03-26 NOTE — Progress Notes (Signed)
 Daily Progress Note   Patient Name: Kevin Bradshaw       Date: 03/26/2023 DOB: [birth year]  Age: 88 y.o. MRN#: [id number] Attending Physician: Darlin Priestly, MD Primary Care Physician: Gracelyn Nurse, MD Admit Date: 03/24/2023  Reason for Consultation/Follow-up: Establishing goals of care  HPI/Brief Hospital Review:  88 y.o. male  with past medical history of  chronic A-fib not on anticoagulation due to frequent falls, HTN, HLD, Parkinson's disease, frequent falls, multiple CVAs since 2019 on aspirin and Plavix, last hospitalized in September 2024 with Proteus UTI admitted on 03/24/2023 with AMS with outpatient labs revealing elevated sodium.   Patient is being treated for SIRS with possible sepsis and community-acquired pneumonia.  Patient presented hypotensive, tachycardic, but with WBC and lactic acid WNL.  Chest x-ray revealed possible infiltrates.  Patient given Rocephin and azithromycin as well as D5 for hydration.   SLP evaluation revealed low aspiration with risk and recommended a dysphagia diet with pills in pure.   PMT was consulted to support patient and family and goals of care discussions.   Subjective: Extensive chart review has been completed prior to meeting patient including labs, vital signs, imaging, progress notes, orders, and available advanced directive documents from current and previous encounters.    Visited with Kevin Bradshaw at his bedside. He is resting in bed, he is awake, unable to communicate, responds occasionally with grunting or head nodding. Unable to perform ROS.  Daughter-Kevin Bradshaw and son at bedside. Family shares Kevin Bradshaw is from [facility], has been a resident there for about 5 months, lived in ALF prior. Kevin Bradshaw shares Kevin Bradshaw lived with her for about 9  years prior to ALF before his care become too much.  Kevin Bradshaw is HCPOA confirmed by her brother, they also have a sister but she is unable to visit or participate in conversations as she is in poor health and lives within a facility herself.  Family shares over the last several weeks to a month Kevin Bradshaw has had a drastic functional and mental decline. He no longer recognizes their faces, now requires total assistance with all ADL's and appetite has drastically declined.  We discussed patient's current illness and what it means in the larger context of patient's on-going co-morbidities. Natural disease trajectory and expectations at EOL were discussed as related  to underlying Parkinson's and dementia. We discussed expectation related to decline in appetite and overall functional status.  We discussed continuing with current plan of care and pursuing aggressive medical interventions versus shifting our focus to a comfort care/hospice care approach. We discussed the overall philosophy of hospice, family aware [facility] has in house hospice team that would be able to provide services.  Family shares they are interested in Kevin Bradshaw returning to [facility] with engaging hospice and having them follow on his return. They wish to continue treating the treatable while he remains inpatient and optimizing him as best we can prior to discharging.  I completed a MOST form today and the signed original was placed in the chart. Each section of options on the form were reviewed in full detail and any questions were answered as needed. The form was scanned and sent to medical records for it to be uploaded under ACP tab in Epic. A photocopy was also placed in the chart to be scanned into EMR. The patient outlined their wishes for the following treatment decisions:  Cardiopulmonary Resuscitation: Do Not Attempt Resuscitation (DNR/No CPR)  Medical Interventions: Comfort Measures: Keep clean, warm, and dry. Use  medication by any route, positioning, wound care, and other measures to relieve pain and suffering. Use oxygen, suction and manual treatment of airway obstruction as needed for comfort. Do not transfer to the hospital unless comfort needs cannot be met in current location.  Antibiotics: Determine use of limitation of antibiotics when infection occurs  IV Fluids: IV fluids for a defined trial period  Feeding Tube: No feeding tube     Engaged TOC regarding plan to return with hospice following.  Answered and addressed all questions and concerns. PMT to continue to follow for ongoing needs and support.  Vital Signs: BP (!) 150/88 (BP Location: Left Arm)   Pulse (!) 116   Temp 98.7 F (37.1 C)   Resp 16   Ht 5\' 3"  (1.6 m)   Wt 64.9 kg   SpO2 92%   BMI 25.33 kg/m  SpO2: SpO2: 92 % O2 Device: O2 Device: Room Air O2 Flow Rate:     Palliative Care Assessment & Plan   Assessment/Recommendation/Plan  DNR/DNI Return to [facility] with [facility] following MOST form completed and uploaded  Care plan was discussed with primary team and nursing staff.  Thank you for allowing the Palliative Medicine Team to assist in the care of this patient.  Total time:  65 minutes  Time spent includes: Detailed review of medical records (labs, imaging, vital signs), medically appropriate exam (mental status, respiratory, cardiac, skin), discussed with treatment team, counseling and educating patient, family and staff, documenting clinical information, medication management and coordination of care.  Leeanne Deed, DNP, AGNP-C Palliative Medicine   Please contact Palliative Medicine Team phone at [phone] for questions and concerns.

## 2023-03-26 NOTE — Plan of Care (Signed)
  Problem: Fluid Volume: Goal: Hemodynamic stability will improve Outcome: Progressing   Problem: Clinical Measurements: Goal: Diagnostic test results will improve Outcome: Progressing Goal: Signs and symptoms of infection will decrease Outcome: Progressing   Problem: Respiratory: Goal: Ability to maintain adequate ventilation will improve Outcome: Progressing   Problem: Education: Goal: Knowledge of General Education information will improve Description: Including pain rating scale, medication(s)/side effects and non-pharmacologic comfort measures Outcome: Progressing   Problem: Health Behavior/Discharge Planning: Goal: Ability to manage health-related needs will improve Outcome: Progressing   Problem: Clinical Measurements: Goal: Ability to maintain clinical measurements within normal limits will improve Outcome: Progressing Goal: Will remain free from infection Outcome: Progressing Goal: Diagnostic test results will improve Outcome: Progressing Goal: Respiratory complications will improve Outcome: Progressing Goal: Cardiovascular complication will be avoided Outcome: Progressing   Problem: Activity: Goal: Risk for activity intolerance will decrease Outcome: Progressing

## 2023-03-26 NOTE — Progress Notes (Signed)
  PROGRESS NOTE    Kevin Bradshaw  ZOX:[id number] DOB: [birth year] DOA: 03/24/2023 PCP: Gracelyn Nurse, MD  114A/114A-AA  LOS: 1 day   Brief hospital course:   Assessment & Plan: Kevin Bradshaw is a 88 y.o. male with medical history significant for chronic A-fib not on anticoagulation due to frequent falls, HTN, HLD, Parkinson's disease,frequent falls, multiple CVAs since 2019 on aspirin and Plavix, , last hospitalized in September 2024 with Proteus UTI who was brought in by EMS with altered mental status, with outpatient labs showing elevated sodium.    Hypernatremia --due to dehydration and free water deficit --improved with IVF --cont MIVF as D5 1/2NS --monitor Na  Hypotension --2/2 dehydration and hypovolemia.  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   Objective: Vitals:   03/26/23 0952 03/26/23 0959 03/26/23 1000 03/26/23 1340  BP: (!) 194/160 (!) 194/169 130/80 (!) 150/88  Pulse: (!) 107 (!) 107  (!) 116  Resp:  16  16  Temp: 98.4 F (36.9 C) 98.4 F (36.9 C)  98.7 F (37.1 C)  TempSrc:      SpO2: 93% 93%  92%  Weight:      Height:        Intake/Output Summary (Last 24 hours) at  03/26/2023 1729 Last data filed at 03/26/2023 1500 Gross per 24 hour  Intake 340 ml  Output 1000 ml  Net -660 ml   Filed Weights   03/24/23 2223  Weight: 64.9 kg    Examination:   Constitutional: NAD, awake, not oriented HEENT: conjunctivae and lids normal, EOMI CV: No cyanosis.   RESP: normal respiratory effort, on RA SKIN: warm, dry Foley present   Data Reviewed: I have personally reviewed labs and imaging studies  Time spent: 50 minutes  Darlin Priestly, MD [facility] If 7PM-7AM, please contact night-coverage 03/26/2023, 5:29 PM

## 2023-03-26 NOTE — Plan of Care (Signed)
  Problem: Fluid Volume: Goal: Hemodynamic stability will improve Outcome: Progressing   Problem: Clinical Measurements: Goal: Diagnostic test results will improve Outcome: Progressing Goal: Signs and symptoms of infection will decrease Outcome: Progressing   Problem: Respiratory: Goal: Ability to maintain adequate ventilation will improve Outcome: Progressing   Problem: Education: Goal: Knowledge of General Education information will improve Description: Including pain rating scale, medication(s)/side effects and non-pharmacologic comfort measures Outcome: Not Progressing   Problem: Health Behavior/Discharge Planning: Goal: Ability to manage health-related needs will improve Outcome: Not Progressing   Problem: Clinical Measurements: Goal: Ability to maintain clinical measurements within normal limits will improve Outcome: Progressing Goal: Will remain free from infection Outcome: Progressing Goal: Diagnostic test results will improve Outcome: Progressing Goal: Respiratory complications will improve Outcome: Progressing Goal: Cardiovascular complication will be avoided Outcome: Progressing   Problem: Activity: Goal: Risk for activity intolerance will decrease Outcome: Progressing   Problem: Nutrition: Goal: Adequate nutrition will be maintained Outcome: Progressing   Problem: Coping: Goal: Level of anxiety will decrease Outcome: Progressing   Problem: Elimination: Goal: Will not experience complications related to bowel motility Outcome: Progressing Goal: Will not experience complications related to urinary retention Outcome: Progressing   Problem: Pain Managment: Goal: General experience of comfort will improve and/or be controlled Outcome: Progressing   Problem: Safety: Goal: Ability to remain free from injury will improve Outcome: Progressing   Problem: Skin Integrity: Goal: Risk for impaired skin integrity will decrease Outcome: Progressing

## 2023-03-26 NOTE — Progress Notes (Signed)
   03/26/23 0752  Assess: MEWS Score  Temp 97.9 F (36.6 C)  BP 131/76  MAP (mmHg) 91  Pulse Rate (!) 113  Resp 16  SpO2 95 %  O2 Device Room Air  Assess: MEWS Score  MEWS Temp 0  MEWS Systolic 0  MEWS Pulse 2  MEWS RR 0  MEWS LOC 1  MEWS Score 3  MEWS Score Color Yellow  Assess: if the MEWS score is Yellow or Red  Were vital signs accurate and taken at a resting state? Yes  Does the patient meet 2 or more of the SIRS criteria? No  Does the patient have a confirmed or suspected source of infection? Yes  MEWS guidelines implemented  Yes, yellow  Treat  MEWS Interventions Considered administering scheduled or prn medications/treatments as ordered  Take Vital Signs  Increase Vital Sign Frequency  Yellow: Q2hr x1, continue Q4hrs until patient remains green for 12hrs  Escalate  MEWS: Escalate Yellow: Discuss with charge nurse and consider notifying provider and/or RRT  Notify: Charge Nurse/RN  Name of Charge Nurse/RN Notified RN Brandi  Provider Notification  Provider Name/Title Darlin Priestly MD  Date Provider Notified 03/26/23  Time Provider Notified [phone]  Method of Notification [facility] CHART)  Notification Reason Other (Comment) (pt is yellow mews due to  heart rate is 113)  Provider response No new orders  Date of Provider Response 03/26/23  Time of Provider Response 0800  Assess: SIRS CRITERIA  SIRS Temperature  0  SIRS Respirations  0  SIRS Pulse 1  SIRS WBC 0  SIRS Score Sum  1

## 2023-03-27 DIAGNOSIS — Z515 Encounter for palliative care: Secondary | ICD-10-CM | POA: Diagnosis not present

## 2023-03-27 DIAGNOSIS — E870 Hyperosmolality and hypernatremia: Secondary | ICD-10-CM | POA: Diagnosis not present

## 2023-03-27 DIAGNOSIS — J189 Pneumonia, unspecified organism: Secondary | ICD-10-CM | POA: Diagnosis not present

## 2023-03-27 DIAGNOSIS — E860 Dehydration: Secondary | ICD-10-CM | POA: Diagnosis not present

## 2023-03-27 LAB — BASIC METABOLIC PANEL
Anion gap: 6 (ref 5–15)
BUN: 16 mg/dL (ref 8–23)
CO2: 27 mmol/L (ref 22–32)
Calcium: 8.6 mg/dL — ABNORMAL LOW (ref 8.9–10.3)
Chloride: 114 mmol/L — ABNORMAL HIGH (ref 98–111)
Creatinine, Ser: 0.86 mg/dL (ref 0.61–1.24)
GFR, Estimated: >60 mL/min (ref >60)
Glucose, Bld: 110 mg/dL — ABNORMAL HIGH (ref 70–99)
Potassium: 4 mmol/L (ref 3.5–5.1)
Sodium: 147 mmol/L — ABNORMAL HIGH (ref 135–145)

## 2023-03-27 LAB — SODIUM: Sodium: 146 mmol/L — ABNORMAL HIGH (ref 135–145)

## 2023-03-27 MED ORDER — KCL IN DEXTROSE-NACL 40-5-0.45 MEQ/L-%-% IV SOLN
INTRAVENOUS | Status: DC
  Filled 2023-03-27 (×2): qty 1000

## 2023-03-27 MED ORDER — DAKINS (1/4 STRENGTH) 0.125 % EX SOLN
Freq: Every day | CUTANEOUS | Status: DC
  Filled 2023-03-27: qty 1

## 2023-03-27 NOTE — Consult Note (Signed)
 WOC Nurse Consult Note: Reason for Consult: sacral pressure injuries  Patient from SNF, plans to return with hospice care Significant pressure injuries that extend over sacrum, bilateral buttocks  Wound type: Unstageable Pressure Injury; sacrum/ left buttock. Stage 3 Pressure Injury right buttock  Pressure Injury POA: Yes Measurement: see nursing flow sheets Wound QMV:HQIO buttock and sacrum 100% yellow, non viable tissue; right buttock red but fibrinous  Drainage (amount, consistency, odor) see nursing flow sheets Periwound: intact  Dressing procedure/placement/frequency:  Cleanse buttock and sacral wounds with saline, pat dry Apply 1/4% Dakins solution, pack sacral wound, cover buttock wounds. Top with foam. Change daily and PRN soilage.  Add low air loss mattress for moisture management, pressure redistribution, and comfort.     Re consult if needed, will not follow at this time. Thanks  [place] [employer], RN,CWOCN, CNS, CWON-AP [phone])

## 2023-03-27 NOTE — Progress Notes (Signed)
                                                                                                                                                                                                           Daily Progress Note   Patient Name: Kevin Bradshaw       Date: 03/27/2023 DOB: [birth year]  Age: 88 y.o. MRN#: [id number] Attending Physician: Darlin Priestly, MD Primary Care Physician: Gracelyn Nurse, MD Admit Date: 03/24/2023  Reason for Consultation/Follow-up: Establishing goals of care  HPI/Brief Hospital Review: 88 y.o. male  with past medical history of  chronic A-fib not on anticoagulation due to frequent falls, HTN, HLD, Parkinson's disease, frequent falls, multiple CVAs since 2019 on aspirin and Plavix, last hospitalized in September 2024 with Proteus UTI admitted on 03/24/2023 with AMS with outpatient labs revealing elevated sodium.   Patient is being treated for SIRS with possible sepsis and community-acquired pneumonia.  Patient presented hypotensive, tachycardic, but with WBC and lactic acid WNL.  Chest x-ray revealed possible infiltrates.  Patient given Rocephin and azithromycin as well as D5 for hydration.   SLP evaluation revealed low aspiration with risk and recommended a dysphagia diet with pills in pure.   PMT was consulted to support patient and family and goals of care discussions.   Subjective: Extensive chart review has been completed prior to meeting patient including labs, vital signs, imaging, progress notes, orders, and available advanced directive documents from current and previous encounters.    Visited with [name] at his bedside. Family at bedside assisting him with lunch. He again is unable to engage in conversation. Appears comfortable. Family denies signs or concerns or acute symptoms.  Daughter confirms plan remains to return to [facility] with hospice following. Answered and addressed all questions and concerns. PMT will step away from daily visits as  plans/goals clear, will follow peripherally.  Thank you for allowing the Palliative Medicine Team to assist in the care of this patient.  Total time:  25 minutes  Time spent includes: Detailed review of medical records (labs, imaging, vital signs), medically appropriate exam (mental status, respiratory, cardiac, skin), discussed with treatment team, counseling and educating patient, family and staff, documenting clinical information, medication management and coordination of care.  Leeanne Deed, DNP, AGNP-C Palliative Medicine   Please contact Palliative Medicine Team phone at [phone] for questions and concerns.

## 2023-03-27 NOTE — TOC Progression Note (Signed)
 Transition of Care [facility]) - Progression Note    Patient Details  Name: Kevin Bradshaw MRN: [id number] Date of Birth: 03/23/1935  Transition of [facility] CM/SW Contact  Bing Quarry, RN Phone Number: 03/27/2023, 1:38 PM  Clinical Narrative:   3/16: Admitted 03/24/23 from [employer] care. After palliative consult the family wishes to continue with current treatment plan here at the hospital but with to return to [facility] with hospice following. LC has their own hospice team, they will just need to be notified prior to his return. NP plans to complete a MOST form prior to his return.   PMH significant for chronic A-fib not on anticoagulation due to frequent falls, HTN, HLD, Parkinson's disease,frequent falls, multiple CVAs since 2019 on aspirin and Plavix, last hospitalized in September 2024 with Proteus UTI who was brought in by EMS with altered mental status, with outpatient labs showing elevated sodium.  Patient is unable to contribute to history at time of admission. Taken from ED triage and H&P notes.     Gabriel Cirri MSN RN CM  RN Case Manager [place]  Transitions of Care Direct Dial: [phone] (Weekends Only) [facility] Main Office Phone: [phone] [facility] Fax: [phone] [email or web address]         Expected Discharge Plan and Services                                               Social Determinants of Health (SDOH) Interventions SDOH Screenings   Food Insecurity: Patient Unable To Answer (03/26/2023)  Housing: Patient Unable To Answer (03/26/2023)  Transportation Needs: Patient Unable To Answer (03/26/2023)  Utilities: Patient Unable To Answer (03/26/2023)  Social Connections: Patient Unable To Answer (03/26/2023)  Tobacco Use: Medium Risk (03/25/2023)    Readmission Risk Interventions     No data to display

## 2023-03-27 NOTE — Plan of Care (Addendum)
 Patient is alert and disoriented X 4. Pt has a bedsore on sacrum and dressing done as order. Denies any pain. Plan of care ongoing.   Problem: Fluid Volume: Goal: Hemodynamic stability will improve Outcome: Progressing   Problem: Clinical Measurements: Goal: Diagnostic test results will improve Outcome: Progressing Goal: Signs and symptoms of infection will decrease Outcome: Progressing   Problem: Respiratory: Goal: Ability to maintain adequate ventilation will improve Outcome: Progressing   Problem: Clinical Measurements: Goal: Ability to maintain clinical measurements within normal limits will improve Outcome: Progressing Goal: Will remain free from infection Outcome: Progressing Goal: Diagnostic test results will improve Outcome: Progressing Goal: Respiratory complications will improve Outcome: Progressing Goal: Cardiovascular complication will be avoided Outcome: Progressing   Problem: Clinical Measurements: Goal: Will remain free from infection Outcome: Progressing   Problem: Clinical Measurements: Goal: Diagnostic test results will improve Outcome: Progressing   Problem: Clinical Measurements: Goal: Respiratory complications will improve Outcome: Progressing   Problem: Nutrition: Goal: Adequate nutrition will be maintained Outcome: Progressing   Problem: Coping: Goal: Level of anxiety will decrease Outcome: Progressing   Problem: Elimination: Goal: Will not experience complications related to bowel motility Outcome: Progressing Goal: Will not experience complications related to urinary retention Outcome: Progressing

## 2023-03-27 NOTE — Progress Notes (Signed)
 MEWS Progress Note  Patient Details Name: Kevin Bradshaw MRN: [id number] DOB: 10-14-1935 Today's Date: 03/27/2023   MEWS Flowsheet Documentation:  Assess: MEWS Score Temp: 98.3 F (36.8 C) BP: 101/76 MAP (mmHg): 85 Pulse Rate: (!) 108 ECG Heart Rate: 69 Resp: (!) 21 Level of Consciousness: Alert SpO2: 100 % O2 Device: Room Air Patient Activity (if Appropriate): In bed Assess: MEWS Score MEWS Temp: 0 MEWS Systolic: 0 MEWS Pulse: 1 MEWS RR: 1 MEWS LOC: 0 MEWS Score: 2 MEWS Score Color: Yellow Assess: SIRS CRITERIA SIRS Temperature : 0 SIRS Respirations : 1 SIRS Pulse: 1 SIRS WBC: 0 SIRS Score Sum : 2 SIRS Temperature : 0 SIRS Pulse: 1 SIRS Respirations : 1 SIRS WBC: 0 SIRS Score Sum : 2 Assess: if the MEWS score is Yellow or Red Were vital signs accurate and taken at a resting state?: Yes Does the patient meet 2 or more of the SIRS criteria?: Yes Does the patient have a confirmed or suspected source of infection?: Yes MEWS guidelines implemented : Yes, yellow Treat MEWS Interventions: Considered administering scheduled or prn medications/treatments as ordered Take Vital Signs Increase Vital Sign Frequency : Yellow: Q2hr x1, continue Q4hrs until patient remains green for 12hrs Escalate MEWS: Escalate: Yellow: Discuss with charge nurse and consider notifying provider and/or RRT Notify: Charge Nurse/RN Name of Charge Nurse/RN Notified: Lupita Leash, RN Provider Notification Provider Name/Title: Bishop Limbo, NP Date Provider Notified: 03/27/23 Time Provider Notified: 2048 Method of Notification: Page ([place]) Notification Reason: Other (Comment) (yellow mews d/t RR slight elevation in HR) Provider response: No new orders (RN gave prn neb) Date of Provider Response: 03/27/23 Time of Provider Response: 2050      Ernest Mallick 03/27/2023, 9:06 PM

## 2023-03-27 NOTE — Plan of Care (Signed)

## 2023-03-27 NOTE — Progress Notes (Signed)
  PROGRESS NOTE    Kevin Bradshaw  WUJ:[id number] DOB: 09/27/1935 DOA: 03/24/2023 PCP: Gracelyn Nurse, MD  114A/114A-AA  LOS: 2 days   Brief hospital course:   Assessment & Plan: Kevin Bradshaw is a 88 y.o. male with medical history significant for chronic A-fib not on anticoagulation due to frequent falls, HTN, HLD, Parkinson's disease,frequent falls, multiple CVAs since 2019 on aspirin and Plavix, , last hospitalized in September 2024 with Proteus UTI who was brought in by EMS with altered mental status, with outpatient labs showing elevated sodium.    Hypernatremia --due to dehydration and free water deficit --improved with IVF --cont MIVF as D5 1/2 NS --monitor Na  Hypotension --2/2 dehydration and hypovolemia.  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  Daughter wants pt to return to SNF with hospice --daughter confirmed no feeding tube.  Chronic Foley, POA --pt presented with Foley from SNF.  Per daughter, it was placed to due to skin wound. --cont Foley    DVT prophylaxis: Lovenox SQ Code Status: DNR  Family Communication: daughter updated on the phone today Level of care: Med-Surg Dispo:   The patient is from: SNF Anticipated d/c is to: SNF Anticipated d/c date is: likely tomorrow   Subjective and Interval History:  RN reported pt has poor oral intake.   Objective: Vitals:   03/27/23 0500 03/27/23 0825 03/27/23 0845 03/27/23  1549  BP: 102/74 (!) 128/103 (!) 120/90 95/64  Pulse: 100 88  92  Resp:  16  18  Temp: 99.2 F (37.3 C) 98.4 F (36.9 C)  98.4 F (36.9 C)  TempSrc: Oral     SpO2:  92%  97%  Weight:      Height:        Intake/Output Summary (Last 24 hours) at 03/27/2023 1601 Last data filed at 03/27/2023 9562 Gross per 24 hour  Intake 1347.53 ml  Output 800 ml  Net 547.53 ml   Filed Weights   03/24/23 2223  Weight: 64.9 kg    Examination:   Constitutional: NAD, awake, not oriented HEENT: conjunctivae and lids normal, EOMI CV: No cyanosis.   RESP: normal respiratory effort, on RA Extremities: No effusions, edema in BLE SKIN: warm, dry  Foley present   Data Reviewed: I have personally reviewed labs and imaging studies  Time spent: 50 minutes  Darlin Priestly, MD [facility] If 7PM-7AM, please contact night-coverage 03/27/2023, 4:01 PM

## 2023-03-28 DIAGNOSIS — E870 Hyperosmolality and hypernatremia: Secondary | ICD-10-CM | POA: Diagnosis not present

## 2023-03-28 LAB — BASIC METABOLIC PANEL
Anion gap: 10 (ref 5–15)
BUN: 16 mg/dL (ref 8–23)
CO2: 22 mmol/L (ref 22–32)
Calcium: 8.6 mg/dL — ABNORMAL LOW (ref 8.9–10.3)
Chloride: 113 mmol/L — ABNORMAL HIGH (ref 98–111)
Creatinine, Ser: 0.8 mg/dL (ref 0.61–1.24)
GFR, Estimated: >60 mL/min (ref >60)
Glucose, Bld: 116 mg/dL — ABNORMAL HIGH (ref 70–99)
Potassium: 4.2 mmol/L (ref 3.5–5.1)
Sodium: 145 mmol/L (ref 135–145)

## 2023-03-28 LAB — SODIUM: Sodium: 144 mmol/L (ref 135–145)

## 2023-03-28 MED ORDER — DAKINS (1/4 STRENGTH) 0.125 % EX SOLN: Freq: Every day | CUTANEOUS | Status: DC

## 2023-03-28 MED ORDER — SENNOSIDES-DOCUSATE SODIUM 8.6-50 MG PO TABS
2.0000 | ORAL_TABLET | Freq: Every evening | ORAL | Status: DC | PRN
Start: 2023-03-28 — End: 2023-03-28

## 2023-03-28 MED ORDER — ENSURE ENLIVE PO LIQD: 237.0000 mL | Freq: Two times a day (BID) | ORAL | Status: DC

## 2023-03-28 NOTE — Care Management Important Message (Signed)
 Important Message  Patient Details  Name: Kevin Bradshaw MRN: [id number] Date of Birth: [birth year]   Important Message Given:  Yes - Medicare IM     Cristela Blue, CMA 03/28/2023, 11:57 AM

## 2023-03-28 NOTE — NC FL2 (Signed)
 [place] MEDICAID FL2 LEVEL OF CARE FORM     IDENTIFICATION  Patient Name: Kevin Bradshaw Birthdate: 1935-12-28 Sex: male Admission Date (Current Location): 03/24/2023  [facility] and [place] Number:  Producer, television/film/video and Address:  [facility], [address] [place], [place] [address]      Provider Number: [id number]  Attending Physician Name and Address:  Darlin Priestly, MD  Relative Name and Phone Number:  Franky Macho [phone]    Current Level of Care: Hospital Recommended Level of Care: Skilled Nursing Facility Prior Approval Number:    Date Approved/Denied:   PASRR Number: [id number]  Discharge Plan: SNF    Current Diagnoses: Patient Active Problem List   Diagnosis Date Noted   Hypernatremia 03/25/2023   SIRS (systemic inflammatory response syndrome) (HCC) 03/25/2023   AMS (altered mental status) 03/25/2023   Dysarthria 03/25/2022   Persistent atrial fibrillation (HCC) 03/25/2022   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) 03/25/2022   Personal history of fall 03/25/2022   History of stroke 03/24/2022   UTI (urinary tract infection) 10/02/2018   Atrial fibrillation with RVR (HCC) 10/02/2018   Acute metabolic encephalopathy 08/22/2017   Right leg weakness 08/21/2017   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   CAP (community acquired pneumonia) 01/06/2016   Productive cough 11/09/2010   Hypertension 11/04/2010   Hyperlipidemia 11/04/2010    Orientation RESPIRATION BLADDER Height & Weight     Self    Indwelling catheter Weight: 64.9 kg Height:  5\' 3"  (160 cm)  BEHAVIORAL SYMPTOMS/MOOD NEUROLOGICAL BOWEL NUTRITION STATUS      Incontinent Diet (nectar thick liquids)  AMBULATORY STATUS COMMUNICATION OF NEEDS Skin   Limited Assist   PU Stage and Appropriate Care (R sacral full thickness with sacral foam drsg)                       Personal Care Assistance Level of Assistance  Bathing, Feeding, Dressing Bathing  Assistance: Limited assistance Feeding assistance: Limited assistance Dressing Assistance: Limited assistance     Functional Limitations Info             SPECIAL CARE FACTORS FREQUENCY                       Contractures      Additional Factors Info  Code Status (DNR)               Current Medications (03/28/2023):  This is the current hospital active medication list Current Facility-Administered Medications  Medication Dose Route Frequency Provider Last Rate Last Admin   acetaminophen (TYLENOL) tablet 650 mg  650 mg Oral Q6H PRN Andris Baumann, MD       Or   acetaminophen (TYLENOL) suppository 650 mg  650 mg Rectal Q6H PRN Andris Baumann, MD       albuterol (PROVENTIL) (2.5 MG/3ML) 0.083% nebulizer solution 2.5 mg  2.5 mg Nebulization Q2H PRN Andris Baumann, MD   2.5 mg at 03/27/23 2053   aspirin EC tablet 81 mg  81 mg Oral Daily Darlin Priestly, MD   81 mg at 03/28/23 0913   Chlorhexidine Gluconate Cloth 2 % PADS 6 each  6 each Topical Daily Darlin Priestly, MD   6 each at 03/28/23 0915   clopidogrel (PLAVIX) tablet 75 mg  75 mg Oral Daily Darlin Priestly, MD   75 mg at 03/28/23 0913   divalproex (DEPAKOTE SPRINKLE) capsule 125 mg  125 mg Oral  TID Darlin Priestly, MD   125 mg at 03/28/23 0913   enoxaparin (LOVENOX) injection 40 mg  40 mg Subcutaneous Q24H Lindajo Royal V, MD   40 mg at 03/28/23 0913   feeding supplement (ENSURE ENLIVE / ENSURE PLUS) liquid 237 mL  237 mL Oral BID BM Darlin Priestly, MD       finasteride (PROSCAR) tablet 5 mg  5 mg Oral Daily Darlin Priestly, MD   5 mg at 03/28/23 0913   hydrALAZINE (APRESOLINE) injection 10 mg  10 mg Intravenous Q6H PRN Darlin Priestly, MD       metoprolol succinate (TOPROL-XL) 24 hr tablet 50 mg  50 mg Oral Daily Darlin Priestly, MD   50 mg at 03/28/23 0913   ondansetron (ZOFRAN) tablet 4 mg  4 mg Oral Q6H PRN Andris Baumann, MD       Or   ondansetron [facility]) injection 4 mg  4 mg Intravenous Q6H PRN Andris Baumann, MD       senna-docusate (Senokot-S)  tablet 2 tablet  2 tablet Oral QHS PRN Foust, Katy L, NP       sodium hypochlorite (DAKIN'S 1/4 STRENGTH) topical solution   Irrigation Q0200 Darlin Priestly, MD   Given at 03/28/23 0125   tamsulosin (FLOMAX) capsule 0.4 mg  0.4 mg Oral Daily Darlin Priestly, MD   0.4 mg at 03/28/23 6301     Discharge Medications: Please see discharge summary for a list of discharge medications.  Relevant Imaging Results:  Relevant Lab Results:   Additional Information [id number]  Cherre Blanc, RN

## 2023-03-28 NOTE — TOC Transition Note (Signed)
 Transition of Care [facility]) - Discharge Note   Patient Details  Name: Kevin Bradshaw MRN: [id number] Date of Birth: July 10, 1935  Transition of Care [facility]) CM/SW Contact:  Garret Reddish, RN Phone Number: 03/28/2023, 11:22 AM   Clinical Narrative:    Chart reviewed.  Noted that patient has orders for discharge today.  I have spoken with Tobi Bastos with [employer] and she informs me that patient is able to return to [facility] unit.  Tobi Bastos reports that patient will go to room 407 B and the number to call report is [phone]. I have sent Tobi Bastos with [facility] Discharge Summary, SNF Transfer Report, and Discharge orders.    I have spoken with Franky Macho patient's daughter and informed her that patient will be a discharge for today.    I have arranged EMS transport with [facility].    I have informed staff nurse of the above information.     Final next level of care: Memory Care [facility]) Barriers to Discharge: No Barriers Identified   Patient Goals and CMS Choice            Discharge Placement              Patient chooses bed at: [facility] Patient to be transferred to facility by: [facility] Name of family member notified: Franky Macho Patient and family notified of of transfer: 03/28/23  Discharge Plan and Services Additional resources added to the After Visit Summary for                                       Social Drivers of Health (SDOH) Interventions SDOH Screenings   Food Insecurity: Patient Unable To Answer (03/26/2023)  Housing: Patient Unable To Answer (03/26/2023)  Transportation Needs: Patient Unable To Answer (03/26/2023)  Utilities: Patient Unable To Answer (03/26/2023)  Social Connections: Patient Unable To Answer (03/26/2023)  Tobacco Use: Medium Risk (03/25/2023)     Readmission Risk Interventions     No data to display

## 2023-03-28 NOTE — Plan of Care (Signed)

## 2023-03-28 NOTE — Discharge Summary (Signed)
 Physician Discharge Summary   Kevin Bradshaw  male DOB: 06-14-1935  ZOX:[id number]  PCP: Gracelyn Nurse, MD  Admit date: 03/24/2023 Discharge date: 03/28/2023  Admitted From: SNF Disposition:  SNF with hospice CODE STATUS: DNR  Discharge Instructions     Discharge instructions   Complete by: As directed    DIET - DYS 1, Nectar Thick fluid - -   Discharge wound care:   Complete by: As directed    Cleanse buttock and sacral wounds with saline, pat dry Apply 1/4% Dakins solution, pack sacral wound, cover buttock wounds. Top with foam. Change daily and PRN soilage. [facility] Course:  For full details, please see H&P, progress notes, consult notes and ancillary notes.  Briefly,  Kevin Bradshaw is a 88 y.o. male with medical history significant for chronic A-fib not on anticoagulation due to frequent falls, HTN, Parkinson's disease with dementia, multiple CVAs since 2019 on aspirin and Plavix, last hospitalized in September 2024 with Proteus UTI who presented from SNF with altered mental status, with outpatient labs showing elevated sodium.    Hypernatremia --Na 157 on presentation.  due to dehydration and free water deficit, from poor oral intake. --improved with IVF.  Na 144 prior to discharge.   Hypotension --2/2 dehydration and hypovolemia.  BP improved with IVF  Reduced oral intake and failure to thrive --palliative care consulted.  Daughter is aware that without sufficient oral intake and hydration, pt will develop the same presenting problem of hypernatremia and hypotension.  Daughter confirmed no feeding tube.  Daughter wants pt to return to SNF with hospice   * CAP, ruled out Sepsis ruled out --abx d/c'ed   Acute metabolic encephalopathy Baseline dementia Secondary to dehydration and hypernatremia   Atrial fibrillation with RVR (HCC) EKG with A-fib 139, improved with IV fluid bolus.  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  Not taking ativan PTA. --cont Depakote.  Resume Seroquel and trazodone after discharge.   History of stroke --cont ASA and plavix --cont statin   Chronic Foley, POA --pt presented with Foley from SNF.  Per daughter, it was placed to due to skin wound. --management of Foley per SNF  Unstageable Pressure Injury; sacrum/ left buttock.  Stage 3 Pressure Injury right buttock  --wound care per order above.   Unless noted above, medications under "STOP" list are ones pt was not taking PTA.  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 The score is based on dignosis, age, lab data, medications, orders, and past utilization.   Low:  0-14.9   Medium: 15-21.9   High: 22-29.9   Extreme: 30 and above         Discharge  Instructions:  Allergies as of 03/28/2023   No Known Allergies      Medication List     STOP taking these medications    Ativan 0.5 MG tablet Generic drug: LORazepam   cefTRIAXone 2 g injection Commonly known as: ROCEPHIN   doxycycline 100 MG capsule Commonly known as: VIBRAMYCIN   furosemide 20 MG tablet Commonly known as: LASIX   lidocaine 1 % (with preservative) injection Commonly  known as: XYLOCAINE   traMADol 50 MG tablet Commonly known as: ULTRAM       TAKE these medications    acetaminophen 325 MG tablet Commonly known as: TYLENOL Take 2 tablets (650 mg total) by mouth every 6 (six) hours as needed for mild pain (or Fever >/= 101).   aspirin 81 MG tablet Take 81 mg by mouth daily.   atorvastatin 80 MG tablet Commonly known as: LIPITOR Take 1 tablet (80 mg total) by mouth daily at 6 PM.   clopidogrel 75 MG tablet Commonly known as: PLAVIX Take 1 tablet (75 mg total) by mouth daily.   divalproex 125 MG capsule Commonly known as: DEPAKOTE SPRINKLE Take 125 mg by mouth 3 (three) times daily.   feeding supplement Liqd Take 237 mLs by mouth 3 (three) times daily between meals.   finasteride 5 MG tablet Commonly known as: PROSCAR Take 5 mg by mouth daily.   melatonin 3 MG Tabs tablet Take 6 mg by mouth at bedtime.   metoprolol succinate 50 MG 24 hr tablet Commonly known as: TOPROL-XL Take 50 mg by mouth daily.   QUEtiapine 25 MG tablet Commonly known as: SEROQUEL Take 25 mg by mouth at bedtime.   senna-docusate 8.6-50 MG tablet Commonly known as: Senokot-S Take 1 tablet by mouth at bedtime as needed for mild constipation.   sodium hypochlorite 0.125 % Soln Commonly known as: DAKIN'S 1/4 STRENGTH Irrigate with as directed daily at 2 am. Start taking on: March 29, 2023   tamsulosin 0.4 MG Caps capsule Commonly known as: FLOMAX Take 0.4 mg by mouth daily.   traZODone 50 MG tablet Commonly known as: DESYREL Take 50 mg by mouth at bedtime.               Discharge Care Instructions  (From admission, onward)           Start     Ordered   03/28/23 0000  Discharge wound care:       Comments: Cleanse buttock and sacral wounds with saline, pat dry Apply 1/4% Dakins solution, pack sacral wound, cover buttock wounds. Top with foam. Change daily and PRN soilage. - -   03/28/23 0825             Follow-up Information      Gracelyn Nurse, MD Follow up.   Specialty: Internal Medicine Why: Hospital follow up Contact information: [address] [facility] [place] [address] [phone]                 No Known Allergies   The results of significant diagnostics from this hospitalization (including imaging, microbiology, ancillary and laboratory) are listed below for reference.   Consultations:   Procedures/Studies: DG Chest Port 1 View Result Date: 03/25/2023 CLINICAL DATA:  Questionable sepsis EXAM: PORTABLE CHEST 1 VIEW COMPARISON:  Chest x-ray 04/15/2022 FINDINGS: Heart is mildly enlarged. There are minimal patchy opacities in the lung bases. There are atherosclerotic calcifications. There is no pleural effusion or pneumothorax. No acute fractures are seen. IMPRESSION: Minimal  patchy opacities in the lung bases, atelectasis versus infiltrate. Electronically Signed   By: Darliss Cheney M.D.   On: 03/25/2023 01:18      Labs: BNP (last 3 results) No results for input(s): "BNP" in the last 8760 hours. Basic Metabolic Panel: Recent Labs  Lab 03/24/23 2324 03/25/23 0436 03/25/23 1838 03/26/23 0456 03/27/23 0453 03/28/23 0245  NA 157* 152* 150* 148* 147*  146* 144  K 3.4* 3.5  --  3.5 4.0  --   CL 118* 118*  --  116* 114*  --   CO2 26 27  --  28 27  --   GLUCOSE 127* 121*  --  97 110*  --   BUN 34* 31*  --  22 16  --   CREATININE 1.15 1.01  --  0.96 0.86  --   CALCIUM 9.4 8.6*  --  8.7* 8.6*  --   MG  --  2.2  --  2.1  --   --    Liver Function Tests: Recent Labs  Lab 03/24/23 2324  AST 44*  ALT 28  ALKPHOS 61  BILITOT 0.4  PROT 6.8  ALBUMIN 2.1*   No results for input(s): "LIPASE", "AMYLASE" in the last 168 hours. No results for input(s): "AMMONIA" in the last 168 hours. CBC: Recent Labs  Lab 03/24/23 2324 03/25/23 0436 03/26/23 0456  WBC 10.3 9.2 7.8  HGB 12.3* 11.0* 11.7*  HCT 40.6 36.1* 37.9*  MCV 96.7 97.6 95.2  PLT 219 205 208   Cardiac  Enzymes: No results for input(s): "CKTOTAL", "CKMB", "CKMBINDEX", "TROPONINI" in the last 168 hours. BNP: Invalid input(s): "POCBNP" CBG: No results for input(s): "GLUCAP" in the last 168 hours. D-Dimer No results for input(s): "DDIMER" in the last 72 hours. Hgb A1c No results for input(s): "HGBA1C" in the last 72 hours. Lipid Profile No results for input(s): "CHOL", "HDL", "LDLCALC", "TRIG", "CHOLHDL", "LDLDIRECT" in the last 72 hours. Thyroid function studies No results for input(s): "TSH", "T4TOTAL", "T3FREE", "THYROIDAB" in the last 72 hours.  Invalid input(s): "FREET3" Anemia work up No results for input(s): "VITAMINB12", "FOLATE", "FERRITIN", "TIBC", "IRON", "RETICCTPCT" in the last 72 hours. Urinalysis    Component Value Date/Time   COLORURINE YELLOW (A) 03/24/2023 2325   APPEARANCEUR CLEAR (A) 03/24/2023 2325   LABSPEC 1.029 03/24/2023 2325   PHURINE 5.0 03/24/2023 2325   GLUCOSEU NEGATIVE 03/24/2023 2325   HGBUR NEGATIVE 03/24/2023 2325   BILIRUBINUR NEGATIVE 03/24/2023 2325   KETONESUR NEGATIVE 03/24/2023 2325   PROTEINUR 30 (A) 03/24/2023 2325   NITRITE NEGATIVE 03/24/2023 2325   LEUKOCYTESUR NEGATIVE 03/24/2023 2325   Sepsis Labs Recent Labs  Lab 03/24/23 2324 03/25/23 0436 03/26/23 0456  WBC 10.3 9.2 7.8   Microbiology Recent Results (from the past 240 hours)  Resp panel by RT-PCR (RSV, Flu A&B, Covid) Anterior Nasal Swab     Status: None   Collection Time: 03/24/23 11:25 PM   Specimen: Anterior Nasal Swab  Result Value Ref Range Status   SARS Coronavirus 2 by RT PCR NEGATIVE NEGATIVE Final    Comment: (NOTE) SARS-CoV-2 target nucleic acids are NOT DETECTED.  The SARS-CoV-2 RNA is generally detectable in upper respiratory specimens during the acute phase of infection. The lowest concentration of SARS-CoV-2 viral copies this assay can detect is 138 copies/mL. A negative result does not preclude SARS-Cov-2 infection and should not be used as the sole  basis for treatment or other patient management decisions. A negative result may occur with  improper  specimen collection/handling, submission of specimen other than nasopharyngeal swab, presence of viral mutation(s) within the areas targeted by this assay, and inadequate number of viral copies(<138 copies/mL). A negative result must be combined with clinical observations, patient history, and epidemiological information. The expected result is Negative.  Fact Sheet for Patients:  BloggerCourse.com  Fact Sheet for Healthcare Providers:  SeriousBroker.it  This test is no t yet approved or cleared by the [place] FDA and  has been authorized for detection and/or diagnosis of SARS-CoV-2 by FDA under an Emergency Use Authorization (EUA). This EUA will remain  in effect (meaning this test can be used) for the duration of the COVID-19 declaration under Section 564(b)(1) of the Act, 21 U.S.C.section 360bbb-3(b)(1), unless the authorization is terminated  or revoked sooner.       Influenza A by PCR NEGATIVE NEGATIVE Final   Influenza B by PCR NEGATIVE NEGATIVE Final    Comment: (NOTE) The Xpert Xpress SARS-CoV-2/FLU/RSV plus assay is intended as an aid in the diagnosis of influenza from Nasopharyngeal swab specimens and should not be used as a sole basis for treatment. Nasal washings and aspirates are unacceptable for Xpert Xpress SARS-CoV-2/FLU/RSV testing.  Fact Sheet for Patients: BloggerCourse.com  Fact Sheet for Healthcare Providers: SeriousBroker.it  This test is not yet approved or cleared by the [place] FDA and has been authorized for detection and/or diagnosis of SARS-CoV-2 by FDA under an Emergency Use Authorization (EUA). This EUA will remain in effect (meaning this test can be used) for the duration of the COVID-19 declaration under Section 564(b)(1) of the Act,  21 U.S.C. section 360bbb-3(b)(1), unless the authorization is terminated or revoked.     Resp Syncytial Virus by PCR NEGATIVE NEGATIVE Final    Comment: (NOTE) Fact Sheet for Patients: BloggerCourse.com  Fact Sheet for Healthcare Providers: SeriousBroker.it  This test is not yet approved or cleared by the [place] FDA and has been authorized for detection and/or diagnosis of SARS-CoV-2 by FDA under an Emergency Use Authorization (EUA). This EUA will remain in effect (meaning this test can be used) for the duration of the COVID-19 declaration under Section 564(b)(1) of the Act, 21 U.S.C. section 360bbb-3(b)(1), unless the authorization is terminated or revoked.  Performed at [facility], [address] [place], [place] [address]   Blood Culture (routine x 2)     Status: None (Preliminary result)   Collection Time: 03/24/23 11:29 PM   Specimen: BLOOD  Result Value Ref Range Status   Specimen Description BLOOD L WRIST  Final   Special Requests   Final    BOTTLES DRAWN AEROBIC AND ANAEROBIC Blood Culture adequate volume   Culture   Final    NO GROWTH 4 DAYS Performed at [facility], [address], [place], [place] [address]    Report Status PENDING  Incomplete  Blood Culture (routine x 2)     Status: None (Preliminary result)   Collection Time: 03/24/23 11:34 PM   Specimen: BLOOD  Result Value Ref Range Status   Specimen Description BLOOD RW  Final   Special Requests   Final    BOTTLES DRAWN AEROBIC AND ANAEROBIC Blood Culture adequate volume   Culture   Final    NO GROWTH 4 DAYS Performed at [facility], [address], [place], [place] [address]    Report Status PENDING  Incomplete     Total time spend on discharging this patient, including the last patient exam, discussing the hospital stay, instructions for  ongoing care as it relates to all pertinent caregivers, as well as  preparing the medical discharge records, prescriptions, and/or referrals as applicable, is 30 minutes.    Darlin Priestly, MD  [facility] 03/28/2023, 8:25 AM

## 2023-03-28 NOTE — Progress Notes (Signed)
 Report called to [facility]

## 2023-03-29 LAB — CULTURE, BLOOD (ROUTINE X 2)
Culture: NO GROWTH
Culture: NO GROWTH
Special Requests: ADEQUATE
Special Requests: ADEQUATE

## 2023-05-12 DEATH — deceased
